# Patient Record
Sex: Male | Born: 1985
Health system: Southern US, Community
[De-identification: ages and names within clinical notes are randomized; demographics above are authoritative.]

## PROBLEM LIST (undated history)

## (undated) DIAGNOSIS — E785 Hyperlipidemia, unspecified: Secondary | ICD-10-CM

## (undated) DIAGNOSIS — I5042 Chronic combined systolic (congestive) and diastolic (congestive) heart failure: Secondary | ICD-10-CM

## (undated) DIAGNOSIS — N183 Chronic kidney disease, stage 3 unspecified: Secondary | ICD-10-CM

## (undated) DIAGNOSIS — I10 Essential (primary) hypertension: Secondary | ICD-10-CM

## (undated) DIAGNOSIS — Z9189 Other specified personal risk factors, not elsewhere classified: Secondary | ICD-10-CM

## (undated) DIAGNOSIS — R0600 Dyspnea, unspecified: Secondary | ICD-10-CM

## (undated) DIAGNOSIS — N475 Adhesions of prepuce and glans penis: Secondary | ICD-10-CM

## (undated) DIAGNOSIS — R0609 Other forms of dyspnea: Secondary | ICD-10-CM

## (undated) DIAGNOSIS — I428 Other cardiomyopathies: Secondary | ICD-10-CM

## (undated) HISTORY — PX: NO PAST SURGERIES: SHX2092

## (undated) HISTORY — PX: TRANSTHORACIC ECHOCARDIOGRAM: SHX275

---
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
## 2013-11-08 ENCOUNTER — Encounter (HOSPITAL_COMMUNITY): Payer: Self-pay | Admitting: Emergency Medicine

## 2013-11-08 ENCOUNTER — Emergency Department (INDEPENDENT_AMBULATORY_CARE_PROVIDER_SITE_OTHER)
Admission: EM | Admit: 2013-11-08 | Discharge: 2013-11-08 | Disposition: A | Discharge status: Home / Self Care | Payer: Self-pay | Source: Home / Self Care | Attending: Family Medicine | Admitting: Family Medicine

## 2013-11-08 DIAGNOSIS — I10 Essential (primary) hypertension: Secondary | ICD-10-CM

## 2013-11-08 HISTORY — DX: Essential (primary) hypertension: I10

## 2013-11-08 LAB — POCT I-STAT, CHEM 8
BUN: 20 mg/dL (ref 6–23)
Calcium, Ion: 1.24 mmol/L — ABNORMAL HIGH (ref 1.12–1.23)
Chloride: 101 mEq/L (ref 96–112)
Creatinine, Ser: 1.6 mg/dL — ABNORMAL HIGH (ref 0.50–1.35)
Glucose, Bld: 90 mg/dL (ref 70–99)
HCT: 49 % (ref 39.0–52.0)
Hemoglobin: 16.7 g/dL (ref 13.0–17.0)
Potassium: 3.9 mEq/L (ref 3.7–5.3)
Sodium: 141 mEq/L (ref 137–147)
TCO2: 24 mmol/L (ref 0–100)

## 2013-11-08 MED ORDER — LISINOPRIL 20 MG PO TABS: 20.0000 mg | ORAL_TABLET | Freq: Every day | ORAL | Status: DC

## 2013-11-08 MED ORDER — AMLODIPINE BESYLATE 5 MG PO TABS
5.0000 mg | ORAL_TABLET | Freq: Every day | ORAL | Status: DC
Start: 2013-11-08 — End: 2014-09-21

## 2013-11-08 NOTE — ED Notes (Addendum)
Pt c/o HTN; BP today is 180/145 C/o constant HA Denies CP, SOB, weakness, diaphoresis Has not had BP meds x4 months +++ Smokes 0.5 PPD Alert and talking in complete sentences w/no signs of acute distress.

## 2013-11-08 NOTE — ED Provider Notes (Signed)
CSN: [id number]     Arrival date & time 11/08/13  1346 History   First MD Initiated Contact with Patient 11/08/13 1438     Chief Complaint  Patient presents with  . Hypertension   (Consider location/radiation/quality/duration/timing/severity/associated sxs/prior Treatment) Patient is a 28 y.o. male presenting with hypertension. The history is provided by the patient.  Hypertension This is a chronic problem. Episode onset: out of meds for 4 mo, recently checked bp and elevated, knows he needs meds. The problem has not changed since onset.Pertinent negatives include no chest pain, no headaches and no shortness of breath.    Past Medical History  Diagnosis Date  . Hypertension    History reviewed. No pertinent past surgical history. No family history on file. History  Substance Use Topics  . Smoking status: Current Every Day Smoker -- 0.50 packs/day    Types: Cigarettes  . Smokeless tobacco: Not on file  . Alcohol Use: No    Review of Systems  Constitutional: Negative.   HENT: Negative.   Respiratory: Negative for chest tightness and shortness of breath.   Cardiovascular: Negative for chest pain.  Neurological: Negative for headaches.    Allergies  Review of patient's allergies indicates no known allergies.  Home Medications   Prior to Admission medications   Medication Sig Start Date End Date Taking? Authorizing Provider  amLODipine (NORVASC) 5 MG tablet Take 1 tablet (5 mg total) by mouth daily. 11/08/13   Linna Hoff, MD  aspirin 81 MG tablet Take 81 mg by mouth daily.    Historical Provider, MD  hydrochlorothiazide (HYDRODIURIL) 25 MG tablet Take 25 mg by mouth daily.    Historical Provider, MD  lisinopril (PRINIVIL,ZESTRIL) 20 MG tablet Take 1 tablet (20 mg total) by mouth daily. 11/08/13   Linna Hoff, MD  lisinopril (PRINIVIL,ZESTRIL) 40 MG tablet Take 40 mg by mouth daily.    Historical Provider, MD  simvastatin (ZOCOR) 20 MG tablet Take 20 mg by mouth daily.     Historical Provider, MD   BP 180/145  Pulse 84  Temp(Src) 98.5 F (36.9 C) (Oral)  Resp 16  SpO2 99% Physical Exam  Nursing note and vitals reviewed. Constitutional: He is oriented to person, place, and time. He appears well-developed and well-nourished. No distress.  Neck: Normal range of motion. Neck supple. No thyromegaly present.  Cardiovascular: Normal heart sounds.   Pulmonary/Chest: Effort normal and breath sounds normal.  Musculoskeletal: He exhibits no edema.  Lymphadenopathy:    He has no cervical adenopathy.  Neurological: He is alert and oriented to person, place, and time.  Skin: Skin is warm and dry.    ED Course  Procedures (including critical care time) Labs Review Labs Reviewed  POCT I-STAT, CHEM 8 - Abnormal; Notable for the following:    Creatinine, Ser 1.60 (*)    Calcium, Ion 1.24 (*)    All other components within normal limits    Imaging Review No results found.   MDM   1. Essential hypertension        Linna Hoff, MD 11/08/13 [phone]

## 2013-11-08 NOTE — Discharge Instructions (Signed)
See doctor in 2 weeks for bp recheck and medicine adjustment as needed.

## 2013-11-10 NOTE — ED Notes (Signed)
Pt drivers license at visit Called [phone] but # has been disconnected Left drivers license at the front/folder

## 2014-05-01 ENCOUNTER — Encounter (HOSPITAL_COMMUNITY): Payer: Self-pay | Admitting: Emergency Medicine

## 2014-05-01 ENCOUNTER — Emergency Department (INDEPENDENT_AMBULATORY_CARE_PROVIDER_SITE_OTHER)
Admission: EM | Admit: 2014-05-01 | Discharge: 2014-05-01 | Disposition: A | Discharge status: Home / Self Care | Payer: Self-pay | Source: Home / Self Care | Attending: Emergency Medicine | Admitting: Emergency Medicine

## 2014-05-01 DIAGNOSIS — E785 Hyperlipidemia, unspecified: Secondary | ICD-10-CM

## 2014-05-01 DIAGNOSIS — I10 Essential (primary) hypertension: Secondary | ICD-10-CM

## 2014-05-01 DIAGNOSIS — Z720 Tobacco use: Secondary | ICD-10-CM

## 2014-05-01 LAB — POCT I-STAT, CHEM 8
BUN: 21 mg/dL (ref 6–23)
CHLORIDE: 102 meq/L (ref 96–112)
Calcium, Ion: 1.15 mmol/L (ref 1.12–1.23)
Creatinine, Ser: 1.5 mg/dL — ABNORMAL HIGH (ref 0.50–1.35)
Glucose, Bld: 103 mg/dL — ABNORMAL HIGH (ref 70–99)
HCT: 48 % (ref 39.0–52.0)
Hemoglobin: 16.3 g/dL (ref 13.0–17.0)
Potassium: 3.8 mEq/L (ref 3.7–5.3)
Sodium: 139 mEq/L (ref 137–147)
TCO2: 24 mmol/L (ref 0–100)

## 2014-05-01 MED ORDER — LOSARTAN POTASSIUM-HCTZ 50-12.5 MG PO TABS: 1.0000 | ORAL_TABLET | Freq: Every day | ORAL | Status: DC

## 2014-05-01 NOTE — Discharge Instructions (Signed)
Hypertension Hypertension is another name for high blood pressure. High blood pressure forces your heart to work harder to pump blood. A blood pressure reading has two numbers, which includes a higher number over a lower number (example: 110/72). HOME CARE   Have your blood pressure rechecked by your doctor.  Only take medicine as told by your doctor. Follow the directions carefully. The medicine does not work as well if you skip doses. Skipping doses also puts you at risk for problems.  Do not smoke.  Monitor your blood pressure at home as told by your doctor. GET HELP IF:  You think you are having a reaction to the medicine you are taking.  You have repeat headaches or feel dizzy.  You have puffiness (swelling) in your ankles.  You have trouble with your vision. GET HELP RIGHT AWAY IF:   You get a very bad headache and are confused.  You feel weak, numb, or faint.  You get chest or belly (abdominal) pain.  You throw up (vomit).  You cannot breathe very well. MAKE SURE YOU:   Understand these instructions.  Will watch your condition.  Will get help right away if you are not doing well or get worse. Document Released: 10/22/2007 Document Revised: 05/10/2013 Document Reviewed: 02/25/2013 [facility] Patient Information 2015 [place], [place]. This information is not intended to replace advice given to you by your health care provider. Make sure you discuss any questions you have with your health care provider.  Smoking Cessation Quitting smoking is important to your health and has many advantages. However, it is not always easy to quit since nicotine is a very addictive drug. Oftentimes, people try 3 times or more before being able to quit. This document explains the best ways for you to prepare to quit smoking. Quitting takes hard work and a lot of effort, but you can do it. ADVANTAGES OF QUITTING SMOKING  You will live longer, feel better, and live better.  Your body will  feel the impact of quitting smoking almost immediately.  Within 20 minutes, blood pressure decreases. Your pulse returns to its normal level.  After 8 hours, carbon monoxide levels in the blood return to normal. Your oxygen level increases.  After 24 hours, the chance of having a heart attack starts to decrease. Your breath, hair, and body stop smelling like smoke.  After 48 hours, damaged nerve endings begin to recover. Your sense of taste and smell improve.  After 72 hours, the body is virtually free of nicotine. Your bronchial tubes relax and breathing becomes easier.  After 2 to 12 weeks, lungs can hold more air. Exercise becomes easier and circulation improves.  The risk of having a heart attack, stroke, cancer, or lung disease is greatly reduced.  After 1 year, the risk of coronary heart disease is cut in half.  After 5 years, the risk of stroke falls to the same as a nonsmoker.  After 10 years, the risk of lung cancer is cut in half and the risk of other cancers decreases significantly.  After 15 years, the risk of coronary heart disease drops, usually to the level of a nonsmoker.  If you are pregnant, quitting smoking will improve your chances of having a healthy baby.  The people you live with, especially any children, will be healthier.  You will have extra money to spend on things other than cigarettes. QUESTIONS TO THINK ABOUT BEFORE ATTEMPTING TO QUIT You may want to talk about your answers with your health  care provider.  Why do you want to quit?  If you tried to quit in the past, what helped and what did not?  What will be the most difficult situations for you after you quit? How will you plan to handle them?  Who can help you through the tough times? Your family? Friends? A health care provider?  What pleasures do you get from smoking? What ways can you still get pleasure if you quit? Here are some questions to ask your health care provider:  How can you  help me to be successful at quitting?  What medicine do you think would be best for me and how should I take it?  What should I do if I need more help?  What is smoking withdrawal like? How can I get information on withdrawal? GET READY  Set a quit date.  Change your environment by getting rid of all cigarettes, ashtrays, matches, and lighters in your home, car, or work. Do not let people smoke in your home.  Review your past attempts to quit. Think about what worked and what did not. GET SUPPORT AND ENCOURAGEMENT You have a better chance of being successful if you have help. You can get support in many ways.  Tell your family, friends, and coworkers that you are going to quit and need their support. Ask them not to smoke around you.  Get individual, group, or telephone counseling and support. Programs are available at [employer] and health centers. Call your local health department for information about programs in your area.  Spiritual beliefs and practices may help some smokers quit.  Download a "quit meter" on your computer to keep track of quit statistics, such as how long you have gone without smoking, cigarettes not smoked, and money saved.  Get a self-help book about quitting smoking and staying off tobacco. LEARN NEW SKILLS AND BEHAVIORS  Distract yourself from urges to smoke. Talk to someone, go for a walk, or occupy your time with a task.  Change your normal routine. Take a different route to work. Drink tea instead of coffee. Eat breakfast in a different place.  Reduce your stress. Take a hot bath, exercise, or read a book.  Plan something enjoyable to do every day. Reward yourself for not smoking.  Explore interactive web-based programs that specialize in helping you quit. GET MEDICINE AND USE IT CORRECTLY Medicines can help you stop smoking and decrease the urge to smoke. Combining medicine with the above behavioral methods and support can greatly increase  your chances of successfully quitting smoking.  Nicotine replacement therapy helps deliver nicotine to your body without the negative effects and risks of smoking. Nicotine replacement therapy includes nicotine gum, lozenges, inhalers, nasal sprays, and skin patches. Some may be available over-the-counter and others require a prescription.  Antidepressant medicine helps people abstain from smoking, but how this works is unknown. This medicine is available by prescription.  Nicotinic receptor partial agonist medicine simulates the effect of nicotine in your brain. This medicine is available by prescription. Ask your health care provider for advice about which medicines to use and how to use them based on your health history. Your health care provider will tell you what side effects to look out for if you choose to be on a medicine or therapy. Carefully read the information on the package. Do not use any other product containing nicotine while using a nicotine replacement product.  RELAPSE OR DIFFICULT SITUATIONS Most relapses occur within the first 3  months after quitting. Do not be discouraged if you start smoking again. Remember, most people try several times before finally quitting. You may have symptoms of withdrawal because your body is used to nicotine. You may crave cigarettes, be irritable, feel very hungry, cough often, get headaches, or have difficulty concentrating. The withdrawal symptoms are only temporary. They are strongest when you first quit, but they will go away within 10-14 days. To reduce the chances of relapse, try to:  Avoid drinking alcohol. Drinking lowers your chances of successfully quitting.  Reduce the amount of caffeine you consume. Once you quit smoking, the amount of caffeine in your body increases and can give you symptoms, such as a rapid heartbeat, sweating, and anxiety.  Avoid smokers because they can make you want to smoke.  Do not let weight gain distract you.  Many smokers will gain weight when they quit, usually less than 10 pounds. Eat a healthy diet and stay active. You can always lose the weight gained after you quit.  Find ways to improve your mood other than smoking. FOR MORE INFORMATION  www.smokefree.gov  Document Released: 04/29/2001 Document Revised: 09/19/2013 Document Reviewed: 08/14/2011 [facility] Patient Information 2015 [place], [place]. This information is not intended to replace advice given to you by your health care provider. Make sure you discuss any questions you have with your health care provider.  Fat and Cholesterol Control Diet Your diet has an affect on your fat and cholesterol levels in your blood and organs. Too much fat and cholesterol in your blood can affect your:  Heart.  Blood vessels (arteries, veins).  Gallbladder.  Liver.  Pancreas. CONTROL FAT AND CHOLESTEROL WITH DIET Certain foods raise cholesterol and others lower it. It is important to replace bad fats with other types of fat.  Do not eat:  Fatty meats, such as hot dogs and salami.  Stick margarine and some tub margarines that have "partially hydrogenated oils" in them.  Baked goods, such as cookies and crackers that have "partially hydrogenated oils" in them.  Saturated tropical oils, such as coconut and palm oil. Eat the following foods:  Round or loin cuts of red meat.  Chicken (without skin).  Fish.  Veal.  Ground [place]urkey breast.  Shellfish.  Fruit, such as apples.  Vegetables, such as broccoli, potatoes, and carrots.  Beans, peas, and lentils (legumes).  Grains, such as barley, rice, couscous, and bulgar wheat.  Pasta (without cream sauces). Look for foods that are nonfat, low in fat, and low in cholesterol.  FIND FOODS THAT ARE LOWER IN FAT AND CHOLESTEROL  Find foods with soluble fiber and plant sterols (phytosterol). You should eat 2 grams a day of these foods. These foods include:  Fruits.  Vegetables.  Whole  grains.  Dried beans and peas.  Nuts and seeds.  Read package labels. Look for low-saturated fats, trans fat free, low-fat foods.  Choose cheese that have only 2 to 3 grams of saturated fat per ounce.  Use heart-healthy tub margarine that is free of trans fat or partially hydrogenated oil.  Avoid buying baked goods that have partially hydrogenated oils in them. Instead, buy baked goods made with whole grains (whole-wheat or whole oat flour). Avoid baked goods labeled with "flour" or "enriched flour."  Buy non-creamy canned soups with reduced salt and no added fats. PREPARING YOUR FOOD  Broil, bake, steam, or roast foods. Do not fry food.  Use non-stick cooking sprays.  Use lemon or herbs to flavor food instead of using  butter or stick margarine.  Use nonfat yogurt, salsa, or low-fat dressings for salads. LOW-SATURATED FAT / LOW-FAT FOOD SUBSTITUTES  Meats / Saturated Fat (g)  Avoid: Steak, marbled (3 oz/85 g) / 11 g.  Choose: Steak, lean (3 oz/85 g) / 4 g.  Avoid: Hamburger (3 oz/85 g) / 7 g.  Choose: Hamburger, lean (3 oz/85 g) / 5 g.  Avoid: Ham (3 oz/85 g) / 6 g.  Choose: Ham, lean cut (3 oz/85 g) / 2.4 g.  Avoid: Chicken, with skin, dark meat (3 oz/85 g) / 4 g.  Choose: Chicken, skin removed, dark meat (3 oz/85 g) / 2 g.  Avoid: Chicken, with skin, light meat (3 oz/85 g) / 2.5 g.  Choose: Chicken, skin removed, light meat (3 oz/85 g) / 1 g. Dairy / Saturated Fat (g)  Avoid: Whole milk (1 cup) / 5 g.  Choose: Low-fat milk, 2% (1 cup) / 3 g.  Choose: Low-fat milk, 1% (1 cup) / 1.5 g.  Choose: Skim milk (1 cup) / 0.3 g.  Avoid: Hard cheese (1 oz/28 g) / 6 g.  Choose: Skim milk cheese (1 oz/28 g) / 2 to 3 g.  Avoid: Cottage cheese, 4% fat (1 cup) / 6.5 g.  Choose: Low-fat cottage cheese, 1% fat (1 cup) / 1.5 g.  Avoid: Ice cream (1 cup) / 9 g.  Choose: Sherbet (1 cup) / 2.5 g.  Choose: Nonfat frozen yogurt (1 cup) / 0.3 g.  Choose: Frozen fruit  bar / trace.  Avoid: Whipped cream (1 tbs) / 3.5 g.  Choose: Nondairy whipped topping (1 tbs) / 1 g. Condiments / Saturated Fat (g)  Avoid: Mayonnaise (1 tbs) / 2 g.  Choose: Low-fat mayonnaise (1 tbs) / 1 g.  Avoid: Butter (1 tbs) / 7 g.  Choose: Extra light margarine (1 tbs) / 1 g.  Avoid: Coconut oil (1 tbs) / 11.8 g.  Choose: Olive oil (1 tbs) / 1.8 g.  Choose: Corn oil (1 tbs) / 1.7 g.  Choose: Safflower oil (1 tbs) / 1.2 g.  Choose: Sunflower oil (1 tbs) / 1.4 g.  Choose: Soybean oil (1 tbs) / 2.4 g .  Choose: Canola oil (1 tbs) / 1 g. Document Released: 11/04/2011 Document Revised: 01/05/2013 Document Reviewed: 08/04/2013 [facility] Patient Information 2015 [place], [place]. This information is not intended to replace advice given to you by your health care provider. Make sure you discuss any questions you have with your health care provider.

## 2014-05-01 NOTE — ED Notes (Signed)
Pt states that he has not had his BP medication in 6 to 7 months.

## 2014-05-01 NOTE — ED Provider Notes (Signed)
CSN: [id number]     Arrival date & time 05/01/14  [id number] History   First MD Initiated Contact with Patient 05/01/14 1002     Chief Complaint  Patient presents with  . Hypertension  . Medication Refill   (Consider location/radiation/quality/duration/timing/severity/associated sxs/prior Treatment) HPI Comments: 28 year old male presents to have his blood pressure medicine refilled. He has been in jail for 4 years and was discharged approximately one year ago. He was given nearly a years worth of blood pressure medicine to take until he can find a doctor but states he got lazy and never really found 1. He is now complaining of headache, dull achiness, left side of the head. Occasionally will become lightheaded. He also smokes one half pack per day. Denies chest pain, shortness of breath, syncope. He states he is radiated to get back in shape and in better health so he can support his new unborn child.   Past Medical History  Diagnosis Date  . Hypertension    History reviewed. No pertinent past surgical history. History reviewed. No pertinent family history. History  Substance Use Topics  . Smoking status: Current Every Day Smoker -- 0.50 packs/day    Types: Cigarettes  . Smokeless tobacco: Not on file  . Alcohol Use: No    Review of Systems  Constitutional: Negative.   Respiratory: Negative for cough and shortness of breath.   Cardiovascular: Negative for chest pain, palpitations and leg swelling.  Gastrointestinal: Negative for vomiting and abdominal pain.  Genitourinary: Negative.   Musculoskeletal: Negative.   Neurological: Positive for light-headedness and headaches. Negative for tremors, seizures, syncope, speech difficulty and numbness.    Allergies  Review of patient's allergies indicates no known allergies.  Home Medications   Prior to Admission medications   Medication Sig Start Date End Date Taking? Authorizing Provider  amLODipine (NORVASC) 5 MG tablet Take 1 tablet  (5 mg total) by mouth daily. 11/08/13   Linna HoffJames D Kindl, MD  aspirin 81 MG tablet Take 81 mg by mouth daily.    Historical Provider, MD  hydrochlorothiazide (HYDRODIURIL) 25 MG tablet Take 25 mg by mouth daily.    Historical Provider, MD  losartan-hydrochlorothiazide (HYZAAR) 50-12.5 MG per tablet Take 1 tablet by mouth daily. 05/01/14   Hayden Rasmussenavid Rishaan Gunner, NP  simvastatin (ZOCOR) 20 MG tablet Take 20 mg by mouth daily.    Historical Provider, MD   BP 173/95 mmHg  Pulse 74  Temp(Src) 97.9 F (36.6 C) (Oral)  Resp 18  SpO2 100% Physical Exam  Constitutional: He is oriented to person, place, and time. He appears well-developed and well-nourished. No distress.  Eyes: Conjunctivae and EOM are normal. Pupils are equal, round, and reactive to light.  Neck: Normal range of motion. Neck supple.  Cardiovascular: Normal rate, regular rhythm and normal heart sounds.   No murmur heard. Pulmonary/Chest: Effort normal. No respiratory distress. He has no wheezes.  Musculoskeletal: Normal range of motion. He exhibits no edema.  Neurological: He is alert and oriented to person, place, and time. No cranial nerve deficit. He exhibits normal muscle tone.  Skin: Skin is warm and dry.  Psychiatric: He has a normal mood and affect.  Nursing note and vitals reviewed.   ED Course  Procedures (including critical care time) Labs Review Labs Reviewed  POCT I-STAT, CHEM 8 - Abnormal; Notable for the following:    Creatinine, Ser 1.50 (*)    Glucose, Bld 103 (*)    All other components within normal limits    Imaging  Review No results found.   MDM   1. Essential hypertension   2. Tobacco abuse disorder   3. Dyslipidemia    Losartan/hctz 50/12.5 q d #50 Must see PCP for Primary Care.    Hayden Rasmussen, NP 05/01/14 1126

## 2014-09-18 ENCOUNTER — Emergency Department (HOSPITAL_COMMUNITY): Payer: Medicaid Other

## 2014-09-18 ENCOUNTER — Inpatient Hospital Stay (HOSPITAL_COMMUNITY)
Admission: EM | Admit: 2014-09-18 | Discharge: 2014-09-21 | DRG: 291 | Disposition: A | Discharge status: Home / Self Care | Payer: Medicaid Other | Attending: Internal Medicine | Admitting: Internal Medicine

## 2014-09-18 ENCOUNTER — Encounter (HOSPITAL_COMMUNITY): Payer: Self-pay | Admitting: *Deleted

## 2014-09-18 DIAGNOSIS — R0602 Shortness of breath: Secondary | ICD-10-CM

## 2014-09-18 DIAGNOSIS — I5041 Acute combined systolic (congestive) and diastolic (congestive) heart failure: Secondary | ICD-10-CM | POA: Diagnosis present

## 2014-09-18 DIAGNOSIS — I10 Essential (primary) hypertension: Secondary | ICD-10-CM | POA: Diagnosis present

## 2014-09-18 DIAGNOSIS — E785 Hyperlipidemia, unspecified: Secondary | ICD-10-CM | POA: Diagnosis present

## 2014-09-18 DIAGNOSIS — Z7982 Long term (current) use of aspirin: Secondary | ICD-10-CM | POA: Diagnosis not present

## 2014-09-18 DIAGNOSIS — Z9119 Patient's noncompliance with other medical treatment and regimen: Secondary | ICD-10-CM

## 2014-09-18 DIAGNOSIS — I313 Pericardial effusion (noninflammatory): Secondary | ICD-10-CM | POA: Diagnosis present

## 2014-09-18 DIAGNOSIS — Z91199 Patient's noncompliance with other medical treatment and regimen due to unspecified reason: Secondary | ICD-10-CM

## 2014-09-18 DIAGNOSIS — E669 Obesity, unspecified: Secondary | ICD-10-CM | POA: Diagnosis present

## 2014-09-18 DIAGNOSIS — Z87891 Personal history of nicotine dependence: Secondary | ICD-10-CM | POA: Diagnosis not present

## 2014-09-18 DIAGNOSIS — I248 Other forms of acute ischemic heart disease: Secondary | ICD-10-CM | POA: Diagnosis present

## 2014-09-18 DIAGNOSIS — N182 Chronic kidney disease, stage 2 (mild): Secondary | ICD-10-CM | POA: Diagnosis present

## 2014-09-18 DIAGNOSIS — Z6839 Body mass index (BMI) 39.0-39.9, adult: Secondary | ICD-10-CM

## 2014-09-18 DIAGNOSIS — N179 Acute kidney failure, unspecified: Secondary | ICD-10-CM | POA: Diagnosis present

## 2014-09-18 DIAGNOSIS — I429 Cardiomyopathy, unspecified: Secondary | ICD-10-CM | POA: Diagnosis present

## 2014-09-18 DIAGNOSIS — E119 Type 2 diabetes mellitus without complications: Secondary | ICD-10-CM | POA: Diagnosis present

## 2014-09-18 DIAGNOSIS — R778 Other specified abnormalities of plasma proteins: Secondary | ICD-10-CM | POA: Diagnosis present

## 2014-09-18 DIAGNOSIS — R7989 Other specified abnormal findings of blood chemistry: Secondary | ICD-10-CM | POA: Diagnosis present

## 2014-09-18 DIAGNOSIS — E876 Hypokalemia: Secondary | ICD-10-CM | POA: Diagnosis present

## 2014-09-18 DIAGNOSIS — I161 Hypertensive emergency: Secondary | ICD-10-CM | POA: Insufficient documentation

## 2014-09-18 DIAGNOSIS — Z9114 Patient's other noncompliance with medication regimen: Secondary | ICD-10-CM | POA: Diagnosis present

## 2014-09-18 DIAGNOSIS — F1290 Cannabis use, unspecified, uncomplicated: Secondary | ICD-10-CM | POA: Diagnosis present

## 2014-09-18 DIAGNOSIS — N183 Chronic kidney disease, stage 3 unspecified: Secondary | ICD-10-CM

## 2014-09-18 DIAGNOSIS — I130 Hypertensive heart and chronic kidney disease with heart failure and stage 1 through stage 4 chronic kidney disease, or unspecified chronic kidney disease: Secondary | ICD-10-CM

## 2014-09-18 DIAGNOSIS — R0600 Dyspnea, unspecified: Secondary | ICD-10-CM

## 2014-09-18 DIAGNOSIS — N189 Chronic kidney disease, unspecified: Secondary | ICD-10-CM | POA: Insufficient documentation

## 2014-09-18 DIAGNOSIS — I5022 Chronic systolic (congestive) heart failure: Secondary | ICD-10-CM | POA: Diagnosis present

## 2014-09-18 DIAGNOSIS — I5021 Acute systolic (congestive) heart failure: Secondary | ICD-10-CM

## 2014-09-18 LAB — BASIC METABOLIC PANEL
Anion gap: 12 (ref 5–15)
BUN: 18 mg/dL (ref 6–20)
CO2: 21 mmol/L — ABNORMAL LOW (ref 22–32)
Calcium: 8.8 mg/dL — ABNORMAL LOW (ref 8.9–10.3)
Chloride: 104 mmol/L (ref 101–111)
Creatinine, Ser: 1.75 mg/dL — ABNORMAL HIGH (ref 0.61–1.24)
GFR calc Af Amer: 59 mL/min — ABNORMAL LOW (ref >60)
GFR calc non Af Amer: 51 mL/min — ABNORMAL LOW (ref >60)
Glucose, Bld: 108 mg/dL — ABNORMAL HIGH (ref 70–99)
Potassium: 3 mmol/L — ABNORMAL LOW (ref 3.5–5.1)
Sodium: 137 mmol/L (ref 135–145)

## 2014-09-18 LAB — CBC WITH DIFFERENTIAL/PLATELET
Basophils Absolute: 0 10*3/uL (ref 0.0–0.1)
Basophils Relative: 0 % (ref 0–1)
Eosinophils Absolute: 0.1 10*3/uL (ref 0.0–0.7)
Eosinophils Relative: 1 % (ref 0–5)
HCT: 39.8 % (ref 39.0–52.0)
Hemoglobin: 14 g/dL (ref 13.0–17.0)
Lymphocytes Relative: 23 % (ref 12–46)
Lymphs Abs: 2.1 10*3/uL (ref 0.7–4.0)
MCH: 28.8 pg (ref 26.0–34.0)
MCHC: 35.2 g/dL (ref 30.0–36.0)
MCV: 81.9 fL (ref 78.0–100.0)
Monocytes Absolute: 0.9 10*3/uL (ref 0.1–1.0)
Monocytes Relative: 10 % (ref 3–12)
Neutro Abs: 6 10*3/uL (ref 1.7–7.7)
Neutrophils Relative %: 66 % (ref 43–77)
Platelets: 316 10*3/uL (ref 150–400)
RBC: 4.86 MIL/uL (ref 4.22–5.81)
RDW: 14.2 % (ref 11.5–15.5)
WBC: 9 10*3/uL (ref 4.0–10.5)

## 2014-09-18 LAB — D-DIMER, QUANTITATIVE (NOT AT ARMC): D-Dimer, Quant: 1.71 ug/mL-FEU — ABNORMAL HIGH (ref 0.00–0.48)

## 2014-09-18 LAB — GLUCOSE, CAPILLARY: Glucose-Capillary: 122 mg/dL — ABNORMAL HIGH (ref 70–99)

## 2014-09-18 LAB — TROPONIN I
TROPONIN I: 0.56 ng/mL — AB (ref <0.031)
Troponin I: 0.54 ng/mL (ref <0.031)

## 2014-09-18 LAB — TSH: TSH: 2.057 u[IU]/mL (ref 0.350–4.500)

## 2014-09-18 LAB — BRAIN NATRIURETIC PEPTIDE: B Natriuretic Peptide: 391.7 pg/mL — ABNORMAL HIGH (ref 0.0–100.0)

## 2014-09-18 MED ORDER — LISINOPRIL 5 MG PO TABS
5.0000 mg | ORAL_TABLET | Freq: Every day | ORAL | Status: DC
  Administered 2014-09-18: 5 mg via ORAL
  Filled 2014-09-18 (×2): qty 1

## 2014-09-18 MED ORDER — IOHEXOL 350 MG/ML SOLN
100.0000 mL | Freq: Once | INTRAVENOUS | Status: AC | PRN
  Administered 2014-09-18: 100 mL via INTRAVENOUS

## 2014-09-18 MED ORDER — CARVEDILOL 25 MG PO TABS
25.0000 mg | ORAL_TABLET | Freq: Two times a day (BID) | ORAL | Status: DC
  Administered 2014-09-18 – 2014-09-21 (×6): 25 mg via ORAL
  Filled 2014-09-18 (×8): qty 1

## 2014-09-18 MED ORDER — ACETAMINOPHEN 325 MG PO TABS: 650.0000 mg | ORAL_TABLET | ORAL | Status: DC | PRN

## 2014-09-18 MED ORDER — HYDRALAZINE HCL 20 MG/ML IJ SOLN
10.0000 mg | Freq: Four times a day (QID) | INTRAMUSCULAR | Status: DC | PRN
  Administered 2014-09-18: 10 mg via INTRAVENOUS
  Filled 2014-09-18: qty 1

## 2014-09-18 MED ORDER — AMLODIPINE BESYLATE 5 MG PO TABS
5.0000 mg | ORAL_TABLET | Freq: Every day | ORAL | Status: DC
Start: 2014-09-18 — End: 2014-09-21
  Administered 2014-09-18 – 2014-09-21 (×4): 5 mg via ORAL
  Filled 2014-09-18 (×4): qty 1

## 2014-09-18 MED ORDER — HYDRALAZINE HCL 20 MG/ML IJ SOLN: 5.0000 mg | Freq: Four times a day (QID) | INTRAMUSCULAR | Status: DC | PRN

## 2014-09-18 MED ORDER — ENOXAPARIN SODIUM 40 MG/0.4ML ~~LOC~~ SOLN
40.0000 mg | SUBCUTANEOUS | Status: DC
  Administered 2014-09-18: 40 mg via SUBCUTANEOUS
  Filled 2014-09-18 (×2): qty 0.4

## 2014-09-18 MED ORDER — SODIUM CHLORIDE 0.9 % IV SOLN: 250.0000 mL | INTRAVENOUS | Status: DC | PRN

## 2014-09-18 MED ORDER — SODIUM CHLORIDE 0.9 % IJ SOLN
3.0000 mL | Freq: Two times a day (BID) | INTRAMUSCULAR | Status: DC
  Administered 2014-09-18 – 2014-09-21 (×6): 3 mL via INTRAVENOUS

## 2014-09-18 MED ORDER — SODIUM CHLORIDE 0.9 % IJ SOLN: 3.0000 mL | INTRAMUSCULAR | Status: DC | PRN

## 2014-09-18 MED ORDER — CARVEDILOL 3.125 MG PO TABS
3.1250 mg | ORAL_TABLET | Freq: Two times a day (BID) | ORAL | Status: DC
  Filled 2014-09-18 (×2): qty 1

## 2014-09-18 MED ORDER — SODIUM CHLORIDE 0.9 % IV BOLUS (SEPSIS)
1000.0000 mL | Freq: Once | INTRAVENOUS | Status: AC
  Administered 2014-09-18: 1000 mL via INTRAVENOUS

## 2014-09-18 MED ORDER — ASPIRIN 81 MG PO CHEW
81.0000 mg | CHEWABLE_TABLET | Freq: Every day | ORAL | Status: DC
  Administered 2014-09-18 – 2014-09-21 (×4): 81 mg via ORAL
  Filled 2014-09-18 (×4): qty 1

## 2014-09-18 MED ORDER — ISOSORB DINITRATE-HYDRALAZINE 20-37.5 MG PO TABS
1.0000 | ORAL_TABLET | Freq: Three times a day (TID) | ORAL | Status: DC
Start: 2014-09-18 — End: 2014-09-20
  Administered 2014-09-18 – 2014-09-20 (×5): 1 via ORAL
  Filled 2014-09-18 (×7): qty 1

## 2014-09-18 MED ORDER — SIMVASTATIN 20 MG PO TABS
20.0000 mg | ORAL_TABLET | Freq: Every day | ORAL | Status: DC
  Administered 2014-09-18 – 2014-09-21 (×4): 20 mg via ORAL
  Filled 2014-09-18 (×4): qty 1

## 2014-09-18 MED ORDER — FUROSEMIDE 10 MG/ML IJ SOLN
20.0000 mg | Freq: Two times a day (BID) | INTRAMUSCULAR | Status: DC
  Administered 2014-09-18 – 2014-09-19 (×2): 20 mg via INTRAVENOUS
  Filled 2014-09-18 (×4): qty 2

## 2014-09-18 MED ORDER — ONDANSETRON HCL 4 MG/2ML IJ SOLN: 4.0000 mg | Freq: Four times a day (QID) | INTRAMUSCULAR | Status: DC | PRN

## 2014-09-18 NOTE — Progress Notes (Signed)
Pt c/o slight SOB, dizziness and weakness after going to the bathroom. VS taken and recorded . Verbalized feeling better.

## 2014-09-18 NOTE — Consult Note (Signed)
Date: 09/18/2014               Patient Name:  Shawn [name] MRN: [id number]  DOB: [birth year] Age / Sex: 29 y.o., male   PCP: No Pcp Per Patient         Requesting Physician: Dr. Hollice Espy, MD    Consulting Reason:  Chest pain and SOB.     Chief Complaint: SOB and Cough.   History of Present Illness: 26 Y O male with PMH of HTN, Obesity, CKD- stage 2, HLD, presented to the ED with 2 week hx of SOB and Cough. SOB was initially intermittent in the first week, present with exertion and also at night when he wakes up suddenly SOB and gasping for air, and then he would start coughing, but says these episodes would resolve and he would go back to sleep, he is able to sleep flat, uses 1-2 pillows and this has not changed . Patient endorses abdominal distension but denies, denies leg swelling. Patient says his stable weight over the past year has ranged between 275- 280 Lbs, last checked his weight about a month ago, and denies weight gain. Cough also started about the same time, productive of whitish sputum. Patient endorses bilateral chest pain son his sides, present on,ly when he coughs, but otherwise denies chest pain.  Patient denies fever, sick contacts, rash, leg swelling, facial swelling, vision changes, no previous episodes of similar symptoms. Patient endorses family hx of enlarged heart in his maternal uncle, who was diagnosed at the age  of 39, and died suddenly ~6 months later, after an episode of chest pain, otherwise no family hx of sudden death or heart disease in first degree relatives. Patient was diagnosed with HTN at 30 years of age, has not been complaint with his blood pressure meds due to financial resons- Says his current meds Lorsatan- HCTZ only. He get his medications at the urgent care, has no PCP.  Pt quit smoking cigs- 4 months ago, but presently smokes marijuana, but denies cocaine use or other drugs. Started smoking cigs about the age of 13 years, on and off over the  years, smoked half a PPD. Patient says he was incarcerated for 40 months, was released 21 months ago.  Meds: Current Facility-Administered Medications  Medication Dose Route Frequency Provider Last Rate Last Dose  . 0.9 %  sodium chloride infusion  250 mL Intravenous PRN Hollice Espy, MD      . acetaminophen (TYLENOL) tablet 650 mg  650 mg Oral Q4H PRN Hollice Espy, MD      . amLODipine (NORVASC) tablet 5 mg  5 mg Oral Daily Hollice Espy, MD   5 mg at 09/18/14 1259  . aspirin chewable tablet 81 mg  81 mg Oral Daily Hollice Espy, MD   81 mg at 09/18/14 1259  . carvedilol (COREG) tablet 25 mg  25 mg Oral BID WC Hollice Espy, MD      . enoxaparin (LOVENOX) injection 40 mg  40 mg Subcutaneous Q24H Hollice Espy, MD      . furosemide (LASIX) injection 20 mg  20 mg Intravenous Q12H Hollice Espy, MD      . hydrALAZINE (APRESOLINE) injection 10 mg  10 mg Intravenous Q6H PRN Hollice Espy, MD      . lisinopril (PRINIVIL,ZESTRIL) tablet 5 mg  5 mg Oral Daily Hollice Espy, MD      . ondansetron [facility]) injection 4 mg  4 mg Intravenous Q6H PRN Hollice Espy, MD      . simvastatin (ZOCOR) tablet 20 mg  20 mg Oral Daily Hollice Espy, MD   20 mg at 09/18/14 1345  . sodium chloride 0.9 % injection 3 mL  3 mL Intravenous Q12H Hollice Espy, MD      . sodium chloride 0.9 % injection 3 mL  3 mL Intravenous PRN Hollice Espy, MD        Allergies: Allergies as of 09/18/2014  . (No Known Allergies)   Past Medical History  Diagnosis Date  . Hypertension    History reviewed. No pertinent past surgical history. No family history on file. History   Social History  . Marital Status: Single    Spouse Name: N/A  . Number of Children: N/A  . Years of Education: N/A   Occupational History  . Not on file.   Social History Main Topics  . Smoking status: Former Smoker -- 0.50 packs/day    Types: Cigarettes    Quit date: 05/20/2014  . Smokeless  tobacco: Not on file  . Alcohol Use: No  . Drug Use: Yes    Special: Marijuana  . Sexual Activity: Not on file   Other Topics Concern  . Not on file   Social History Narrative   Review of Systems: CONSTITUTIONAL- No Fever, weightloss, night sweat or change in appetite. SKIN- No Rash. GI- No nausea, vomiting, diarrhoea, constipation, abd pain. URINARY- No Frequency, urgency, straining or dysuria. NEUROLOGIC- No Numbness.  Physical Exam: Blood pressure 186/116, pulse 120, temperature 99.1 F (37.3 C), temperature source Oral, resp. rate 24, height  (1.803 m), weight 282 lb 8 oz (128.141 kg), SpO2 98 %. GENERAL- alert, co-operative, lying in bed, appears as stated age, not in any distress. HEENT- Atraumatic, normocephalic, EOMI, oral mucosa appears moist, neck supple. CARDIAC- Tachycardic, but regular, loud S2 heard in aortic area, no  Appreciable murmurs, or rubs, no JVD appreciated. RESP- Moving equal volumes of air, and basilar crackles- right lung . No wheezes. ABDOMEN- Soft, full but not distended, nontender, no palpable masses or organomegaly. BACK- Normal curvature of the spine. NEURO- Alert and oriented X3, No obvious Cr N abnormality. EXTREMITIES- pulse 2+, symmetric, trace pitting pedal edema. SKIN- Warm, dry, No rash or lesion.  Lab results: Basic Metabolic Panel:  Recent Labs  16/10/96 0900  NA 137  K 3.0*  CL 104  CO2 21*  GLUCOSE 108*  BUN 18  CREATININE 1.75*  CALCIUM 8.8*   CBC:  Recent Labs  09/18/14 0900  WBC 9.0  NEUTROABS 6.0  HGB 14.0  HCT 39.8  MCV 81.9  PLT 316   Cardiac Enzymes:  Recent Labs  09/18/14 0900  TROPONINI 0.54*   BNP: 09/18/2014- BNP- 391.7.  D-Dimer:  Recent Labs  09/18/14 0900  DDIMER 1.71*   Imaging results:  Dg Chest 2 View  09/18/2014   CLINICAL DATA:  Cough, short of breath, wheezing, smoking.  EXAM: CHEST  2 VIEW  COMPARISON:  None.  FINDINGS: Mild cardiomegaly. Mild central vascular congestion. No  pneumothorax. No pleural effusion. Bronchitic changes are likely chronic. Intact thoracic spine.  IMPRESSION: Cardiomegaly and mild central vascular congestion.   Electronically Signed   By: Jolaine Click M.D.   On: 09/18/2014 09:00   Ct Angio Chest Pe W/cm &/or Wo Cm  09/18/2014   CLINICAL DATA:  Shortness of Breath for 2 weeks. Chest pain for 1 week  EXAM: CT ANGIOGRAPHY  CHEST WITH CONTRAST  TECHNIQUE: Multidetector CT imaging of the chest was performed using the standard protocol during bolus administration of intravenous contrast. Multiplanar CT image reconstructions and MIPs were obtained to evaluate the vascular anatomy.  CONTRAST:  OMNIPAQUE IOHEXOL 350 MG/ML SOLN  COMPARISON:  Chest radiograph Sep 18, 2014  FINDINGS: There is no demonstrable pulmonary embolus. There is no thoracic aortic aneurysm or dissection.  There are rather minimal pleural effusions bilaterally. There is hazy ground-glass type opacity in both lower lobes as well as to a much lesser degree in the right upper and right middle lobes. There is no appreciable volume loss.  There is cardiomegaly. There is left ventricular hypertrophy. The pericardium is not thickened.  Thyroid appears normal.  There are several prominent lymph nodes throughout the mediastinum. The largest individual lymph node is in the right hilum measuring 2.4 x 1.8 cm. There is a lymph node in the left hilum measuring 1.6 x 1.5 cm. There are prominent lymph nodes adjacent to the aortic arch and in the sub- carinal region as well.  In the visualized upper abdomen, no lesions are identified. There are no blastic or lytic bone lesions.  Review of the MIP images confirms the above findings.  IMPRESSION: No demonstrable pulmonary embolus. Cardiomegaly with areas of patchy ground-glass opacity which likely represent mild edema and rather minimal pleural effusions. This appearance raises concern for a degree of underlying congestive heart failure. There is left ventricular  hypertrophy. There are several prominent lymph nodes of uncertain etiology. Given these prominent lymph nodes, a followup chest CT in 8-10 weeks may be reasonable.   Electronically Signed   By: Bretta Bang III M.D.   On: 09/18/2014 11:41    Other results: EKG: Rate 112bpm, regular, sinus rhythm, biatrial enlargement, PR interval- 138, QRS duration - 108, prolonged QTC- 506. repol abnormalities, but otherwise no St or T wave abnormalities, left ventricular hypertrophy, Axis- equivocal.   Assessment, Plan, & Recommendations by Problem:  Summary- 28 Y O M presented with SOB with exertion, PND and cough, physical exam notable for tachycardia, unilat crackles, without obvious signs of fluid overload, slight elevated DDImer, BNP, and trops, EKG, chest xray and CTA suggests cardiomegaly and lymphadenopathy but no PE.   Acute heart failure - Exact etiology at this time unknown- but most Likely related to chronic uncontrolled HTN. Patient does not appear markedly fluid overloaded.  Awaiting Echo, and will decides on further work up. Other Considerations, infiltrative diseases- Connective tissue diseases like Sarcoidosis- considering enlarged LN on CTA , idiopathic Cardiomyopathy considering family hx of cardiomegaly, hemochromatosis.  - Echo pending. - Trend troponins. - Consider Ruling out secondary causes of HTN- Renin- Aldo ratio, evaluate for RAS. - Consider Increasing lisinopril to 10mg  daily, and gradually uptitrate. - Consider lipid panel check, TSH, HIV, liver function, HGbA1c, and possible OSA eval as outpt. - Replace k, daily BMEts, daily weights. - Will continue lasix at 20mg  IV BID for now.  HTN- Per primary. Consider holding Coreg for now.   CKD 3- likely related to prolong uncontrolled HTN.   Dispo: Disposition is deferred at this time, awaiting improvement of current medical problems.   Signed: Onnie Boer, MD 09/18/2014, 3:49 PM  [facility] Resident PGY-2 [phone].  I  have seen and examined the patient along with Onnie Boer, MD.  I have reviewed the chart, notes and new data.  I agree with her note.  Key new complaints: he still has exertional dyspnea, NYHA class  III Key examination changes: no overt signs of hypervolemia - normal JVP, no S3, no rales, no edema Key new findings / data: cardiomegaly with spherical LV remodeling and LVH on CT chest. No coronary calcification. Normal origin of LMCA. ECG with LVH.  PLAN: Echo will be highly informative, but I expect will show a dilated left ventricle with depressed LVEF, possibly central mitral regurgitation. 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 Continue diuretics. Consider Bidil for BP control. His creatinine probably underestimates his real renal function - he is very young and muscular. I believe we could still use ACEi for CHF, but would delay since he received iodinated contrast for CTA and we are aggresively diuresing. Also plan use of carvedilol, but avoid high doses of beta blocker while still symptomatic. More detailed plan after echo is available.  Thurmon Fair, MD, [facility] [phone] 09/18/2014, 5:49 PM

## 2014-09-18 NOTE — ED Notes (Signed)
Called lab to add on Trop and BNP 

## 2014-09-18 NOTE — H&P (Signed)
History and Physical  Shawn [name] ZOX:[id number] DOB: [birth year] DOA: 09/18/2014  Referring physician: Thayer Ohm lawyer, ER PA PCP: No PCP Per Patient   Chief Complaint: Dyspnea on exertion  HPI: Shawn [name] is a 29 y.o. male  Past medical history of hypertension, diabetes and obesity who was started on blood pressure and cholesterol medications last year by urgent care, but stopped taking them. He had noted for the past week or 2 that he's been having increased dyspnea on exertion. No outright shortness of breath or chest pain. He became concerned and came into the emergency room today for further evaluation. In the emergency room he was noted to be mildly tachycardic with a blood pressure of 196/118. Labs were done patient was noted to have a creatinine of 1.75, previously at an 0.2 and a BNP of 390. Patient suspected to have acute CHF and started on IV Lasix. Hospitalists were called for further evaluation. D-dimer came back elevated at 1.7   Review of Systems:  Patient seen after arrival to floor. Pt complains of dyspnea on exertion and after getting blood pressure medication, lightheadedness.  Pt denies any headache, vision changes, dysphagia, chest pain, palpitations, shortness of breath or breast, wheezing, cough, abdominal pain, hematuria, dysuria, constipation, diarrhea, focal extremity numbness weakness or pain.  Review of systems are otherwise negative  Past Medical History  Diagnosis Date  . Hypertension    History reviewed. No pertinent past surgical history. Social History:  reports that he quit smoking about 3 months ago. His smoking use included Cigarettes. He smoked 0.50 packs per day. He does not have any smokeless tobacco history on file. He reports that he uses illicit drugs (Marijuana). He reports that he does not drink alcohol. Patient lives at home with his girlfriend & is able to participate in activities of daily living with output assistance  No Known  Allergies  Family history: Hypertension, cardiomegaly  Prior to Admission medications   Medication Sig Start Date End Date Taking? Authorizing Provider  amLODipine (NORVASC) 5 MG tablet Take 1 tablet (5 mg total) by mouth daily. 11/08/13  Yes Linna Hoff, MD  aspirin 81 MG tablet Take 81 mg by mouth daily.   Yes Historical Provider, MD  losartan-hydrochlorothiazide (HYZAAR) 50-12.5 MG per tablet Take 1 tablet by mouth daily. 05/01/14  Yes Hayden Rasmussen, NP  simvastatin (ZOCOR) 20 MG tablet Take 20 mg by mouth daily.   Yes Historical Provider, MD    Physical Exam: BP 162/94 mmHg  Pulse 116  Temp(Src) 99.1 F (37.3 C) (Oral)  Resp 24  Ht  (1.803 m)  Wt 128.141 kg (282 lb 8 oz)  BMI 39.42 kg/m2  SpO2 98%  General:  Alert and oriented 3, no acute distress Eyes: Sclera nonicteric, extraocular movements are intact ENT: Normocephalic and atraumatic, mucous members are moist Neck: No carotid bruits Cardiovascular: Regular rate and rhythm, S1-S2 Respiratory: Decreased breath sounds bibasilar Abdomen: Soft, obese, nontender, positive bowel sounds Skin: No skin breaks, tears or lesions Musculoskeletal: No clubbing or cyanosis, +1 pitting edema bilaterally Psychiatric: Patient is appropriate, no evidence of psychoses Neurologic: No focal deficits           Labs on Admission:  Basic Metabolic Panel:  Recent Labs Lab 09/18/14 0900  NA 137  K 3.0*  CL 104  CO2 21*  GLUCOSE 108*  BUN 18  CREATININE 1.75*  CALCIUM 8.8*   Liver Function Tests: No results for input(s): AST, ALT, ALKPHOS, BILITOT, PROT, ALBUMIN in  the last 168 hours. No results for input(s): LIPASE, AMYLASE in the last 168 hours. No results for input(s): AMMONIA in the last 168 hours. CBC:  Recent Labs Lab 09/18/14 0900  WBC 9.0  NEUTROABS 6.0  HGB 14.0  HCT 39.8  MCV 81.9  PLT 316   Cardiac Enzymes:  Recent Labs Lab 09/18/14 0900  TROPONINI 0.54*    BNP (last 3 results)  Recent Labs   09/18/14 0900  BNP 391.7*    ProBNP (last 3 results) No results for input(s): PROBNP in the last 8760 hours.  CBG: No results for input(s): GLUCAP in the last 168 hours.  Radiological Exams on Admission: Dg Chest 2 View  09/18/2014   CLINICAL DATA:  Cough, short of breath, wheezing, smoking.  EXAM: CHEST  2 VIEW  COMPARISON:  None.  FINDINGS: Mild cardiomegaly. Mild central vascular congestion. No pneumothorax. No pleural effusion. Bronchitic changes are likely chronic. Intact thoracic spine.  IMPRESSION: Cardiomegaly and mild central vascular congestion.   Electronically Signed   By: Jolaine Click M.D.   On: 09/18/2014 09:00   Ct Angio Chest Pe W/cm &/or Wo Cm  09/18/2014   CLINICAL DATA:  Shortness of Breath for 2 weeks. Chest pain for 1 week  EXAM: CT ANGIOGRAPHY CHEST WITH CONTRAST  TECHNIQUE: Multidetector CT imaging of the chest was performed using the standard protocol during bolus administration of intravenous contrast. Multiplanar CT image reconstructions and MIPs were obtained to evaluate the vascular anatomy.  CONTRAST:  OMNIPAQUE IOHEXOL 350 MG/ML SOLN  COMPARISON:  Chest radiograph Sep 18, 2014  FINDINGS: There is no demonstrable pulmonary embolus. There is no thoracic aortic aneurysm or dissection.  There are rather minimal pleural effusions bilaterally. There is hazy ground-glass type opacity in both lower lobes as well as to a much lesser degree in the right upper and right middle lobes. There is no appreciable volume loss.  There is cardiomegaly. There is left ventricular hypertrophy. The pericardium is not thickened.  Thyroid appears normal.  There are several prominent lymph nodes throughout the mediastinum. The largest individual lymph node is in the right hilum measuring 2.4 x 1.8 cm. There is a lymph node in the left hilum measuring 1.6 x 1.5 cm. There are prominent lymph nodes adjacent to the aortic arch and in the sub- carinal region as well.  In the visualized upper  abdomen, no lesions are identified. There are no blastic or lytic bone lesions.  Review of the MIP images confirms the above findings.  IMPRESSION: No demonstrable pulmonary embolus. Cardiomegaly with areas of patchy ground-glass opacity which likely represent mild edema and rather minimal pleural effusions. This appearance raises concern for a degree of underlying congestive heart failure. There is left ventricular hypertrophy. There are several prominent lymph nodes of uncertain etiology. Given these prominent lymph nodes, a followup chest CT in 8-10 weeks may be reasonable.   Electronically Signed   By: Bretta Bang III M.D.   On: 09/18/2014 11:41    EKG: Independently reviewed. Sinus tachycardia with biatrial enlargement, LVH and prolonged QT  Assessment/Plan Present on Admission:  . Acute systolic heart failure: Likely from hypertensive heart disease. Appreciate cardiology consult. On beta blocker and ACE inhibitor. Aggressively diuresing. For echocardiogram. Given elevated d-dimer, check a lower extremity Doppler  . Essential hypertension: ACE inhibitor, beta blocker and diuretic. Patient has been noncompliant.  . Obesity (BMI 30-39.9): Patient needs criteria with BMI greater than 30  . acute kidney injury in the setting of  CKD (chronic kidney disease) stage 2, GFR 60-89 ml/min: Some underlying renal insufficiency most likely from hypertensive disease. Monitor renal function as diuresing  . Elevated troponin: Cycling enzymes, although no evidence of ACS. Most likely this is falsely elevated in the setting of CHF   . Hyperlipidemia: Was previously on statin, recheck fasting lipid panel in the morning  . Hypokalemia: Replace as needed  Consultants: Cardiology  Code Status: Full code  Family Communication: Multiple family members at the bedside   Disposition Plan: Home once fully diuresed, CHF fully assessed and medications that, likely next few days  Time spent: 50  minutes  Hollice Espy [facility] Pager [phone]

## 2014-09-18 NOTE — ED Notes (Signed)
Pt placed on 2 L via nasal cannula. O2 sats 89% on RA.

## 2014-09-18 NOTE — ED Notes (Signed)
Pt states for last 2 weeks he's been coughing.  States he's sob with ambulation and when he lays down the coughing is worse.  Productive, thick sputum.  Denies any swelling.  States the first week he experienced chest pain, but denies sob at this time.

## 2014-09-18 NOTE — Progress Notes (Signed)
Pt admitted to room 3e30, pt alert and oriented and placed on tele.  BP 186/118, nothing ordered PRN for BP.  MD paged.  Will continue to monitor.

## 2014-09-18 NOTE — Progress Notes (Signed)
Pt. With order for stat US of bllateral lower extremities. Radiology stated that [place] to be completed in vascular lab. On call NP, K. Schorr notified.

## 2014-09-19 ENCOUNTER — Inpatient Hospital Stay (HOSPITAL_COMMUNITY): Payer: Medicaid Other

## 2014-09-19 DIAGNOSIS — I10 Essential (primary) hypertension: Secondary | ICD-10-CM

## 2014-09-19 DIAGNOSIS — I509 Heart failure, unspecified: Secondary | ICD-10-CM

## 2014-09-19 DIAGNOSIS — N189 Chronic kidney disease, unspecified: Secondary | ICD-10-CM | POA: Insufficient documentation

## 2014-09-19 DIAGNOSIS — E785 Hyperlipidemia, unspecified: Secondary | ICD-10-CM

## 2014-09-19 DIAGNOSIS — R0602 Shortness of breath: Secondary | ICD-10-CM

## 2014-09-19 DIAGNOSIS — I161 Hypertensive emergency: Secondary | ICD-10-CM | POA: Insufficient documentation

## 2014-09-19 DIAGNOSIS — N179 Acute kidney failure, unspecified: Secondary | ICD-10-CM | POA: Insufficient documentation

## 2014-09-19 DIAGNOSIS — E876 Hypokalemia: Secondary | ICD-10-CM

## 2014-09-19 DIAGNOSIS — E669 Obesity, unspecified: Secondary | ICD-10-CM

## 2014-09-19 LAB — TROPONIN I
Troponin I: 0.46 ng/mL — ABNORMAL HIGH (ref <0.031)
Troponin I: 0.4 ng/mL — ABNORMAL HIGH (ref <0.031)

## 2014-09-19 LAB — BASIC METABOLIC PANEL
Anion gap: 11 (ref 5–15)
BUN: 20 mg/dL (ref 6–20)
CALCIUM: 9 mg/dL (ref 8.9–10.3)
CO2: 25 mmol/L (ref 22–32)
CREATININE: 2.05 mg/dL — AB (ref 0.61–1.24)
Chloride: 103 mmol/L (ref 101–111)
GFR calc Af Amer: 49 mL/min — ABNORMAL LOW (ref >60)
GFR calc non Af Amer: 42 mL/min — ABNORMAL LOW (ref >60)
Glucose, Bld: 103 mg/dL — ABNORMAL HIGH (ref 70–99)
Potassium: 3.5 mmol/L (ref 3.5–5.1)
Sodium: 139 mmol/L (ref 135–145)

## 2014-09-19 MED ORDER — FUROSEMIDE 40 MG PO TABS
40.0000 mg | ORAL_TABLET | Freq: Every day | ORAL | Status: DC
  Administered 2014-09-20 – 2014-09-21 (×2): 40 mg via ORAL
  Filled 2014-09-19 (×2): qty 1

## 2014-09-19 MED ORDER — ENOXAPARIN SODIUM 60 MG/0.6ML ~~LOC~~ SOLN
60.0000 mg | SUBCUTANEOUS | Status: DC
  Administered 2014-09-19 – 2014-09-20 (×2): 60 mg via SUBCUTANEOUS
  Filled 2014-09-19 (×3): qty 0.6

## 2014-09-19 NOTE — Progress Notes (Signed)
PROGRESS NOTE    Shawn [name] UEA:[id number] DOB: 1985-07-04 DOA: 09/18/2014 PCP: No PCP Per Patient  HPI/Brief narrative 29 y.o. male past medical history of hypertension, diabetes and obesity who was started on blood pressure and cholesterol medications last year by urgent care, but stopped taking them. He had noted for the past week or 2 that he's been having increased dyspnea on exertion. No outright shortness of breath or chest pain. He became concerned and came into the emergency room today for further evaluation. In the emergency room he was noted to be mildly tachycardic with a blood pressure of 196/118. Labs were done patient was noted to have a creatinine of 1.75, previously at 1.5 and a BNP of 390. Patient suspected to have acute CHF and started on IV Lasix. Hospitalists were called for further evaluation. D-dimer came back elevated at 1.7 followed by CTA chest-negative for PE. Cardiologists were consulted.  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 - ACEI started on admission but creatinine has slightly worsened and hence will hold today. If creatinine stabilizes, resume ACEI tomorrow. - Hold off on Aldactone. - Continue BiDil - As per cardiology, 2-D echo: LVEF 25-30 percent and restrictive filling noted  Hypertensive emergency - Secondary to noncompliance with medications. Counseled extensively regarding this in the presence of his mother at bedside. He verbalized understanding. - Started on amlodipine, carvedilol, BiDil and ACEI. Blood pressure control improved. - Holding ACEI today secondary to worsening creatinine. Consider resuming in a.m. if creatinine stabilizes.  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  Elevated troponin - Likely from demand ischemia related to uncontrolled hypertension, acute CHF and acute on chronic kidney disease - Flat trend - 2-D echo results as above - Management as indicated above  Hyperlipidemia - Consider statins  Hypokalemia - Replaced and better  Obesity (BMI 30-39.9) - Patient meets criteria with BMI greater than 30   Lymphadenopathy on CT a chest  - Unclear significance  - As per radiologist's recommendation, follow up CT chest in 8-10 weeks    Code Status: Full Family Communication: Discussed with patient's mother at bedside Disposition Plan: DC home when medically stable, possibly in the next 24-48 hours. Still at risk for decline.   Consultants:  Cardiology  Procedures:  None  Antibiotics:  None   Subjective: Patient states that he feels a whole lot better. Denies dyspnea or chest pain.  Objective: Filed Vitals:   09/18/14 1925 09/18/14 2323 09/19/14 0519 09/19/14 1101  BP: 124/80 143/79 124/73 119/75  Pulse: 105 92 96   Temp:   98.3 F (36.8 C)   TempSrc: Oral  Oral   Resp: 16  18   Height:      Weight:   125.057 kg (275 lb 11.2 oz)   SpO2: 99%  99%     Intake/Output Summary (Last 24 hours) at 09/19/14 1148 Last data filed at 09/19/14 0920  Gross per 24 hour  Intake    760 ml  Output   1975 ml  Net  -1215 ml   Filed Weights   09/18/14 0757 09/18/14 1439 09/19/14 0519  Weight: 127.007 kg (280 lb) 128.141 kg (282 lb 8  oz) 125.057 kg (275 lb 11.2 oz)     Exam:  General exam: Pleasant young male ambulating comfortably in the room. Respiratory system: Clear. No increased work of breathing. Cardiovascular system: S1 & S2 heard, RRR. No JVD, murmurs, gallops, clicks or pedal edema. Telemetry: SR-ST 100s Gastrointestinal system: Abdomen is nondistended, soft and  nontender. Normal bowel sounds heard. Central nervous system: Alert and oriented. No focal neurological deficits. Extremities: Symmetric 5 x 5 power.   Data Reviewed: Basic Metabolic Panel:  Recent Labs Lab 09/18/14 0900 09/19/14 0534  NA 137 139  K 3.0* 3.5  CL 104 103  CO2 21* 25  GLUCOSE 108* 103*  BUN 18 20  CREATININE 1.75* 2.05*  CALCIUM 8.8* 9.0   Liver Function Tests: No results for input(s): AST, ALT, ALKPHOS, BILITOT, PROT, ALBUMIN in the last 168 hours. No results for input(s): LIPASE, AMYLASE in the last 168 hours. No results for input(s): AMMONIA in the last 168 hours. CBC:  Recent Labs Lab 09/18/14 0900  WBC 9.0  NEUTROABS 6.0  HGB 14.0  HCT 39.8  MCV 81.9  PLT 316   Cardiac Enzymes:  Recent Labs Lab 09/18/14 0900 09/18/14 1830 09/18/14 2346 09/19/14 0534  TROPONINI 0.54* 0.56* 0.46* 0.40*   BNP (last 3 results) No results for input(s): PROBNP in the last 8760 hours. CBG:  Recent Labs Lab 09/18/14 2004  GLUCAP 122*    No results found for this or any previous visit (from the past 240 hour(s)).      Studies: Dg Chest 2 View  09/18/2014   CLINICAL DATA:  Cough, short of breath, wheezing, smoking.  EXAM: CHEST  2 VIEW  COMPARISON:  None.  FINDINGS: Mild cardiomegaly. Mild central vascular congestion. No pneumothorax. No pleural effusion. Bronchitic changes are likely chronic. Intact thoracic spine.  IMPRESSION: Cardiomegaly and mild central vascular congestion.   Electronically Signed   By: Jolaine Click M.D.   On: 09/18/2014 09:00   Ct Angio Chest Pe W/cm &/or Wo Cm  09/18/2014   CLINICAL DATA:  Shortness of Breath for 2 weeks. Chest pain for 1 week  EXAM: CT ANGIOGRAPHY CHEST WITH CONTRAST  TECHNIQUE: Multidetector CT imaging of the chest was performed using the standard protocol during bolus administration of intravenous contrast. Multiplanar CT image reconstructions and MIPs were obtained to evaluate the vascular anatomy.  CONTRAST:   OMNIPAQUE IOHEXOL 350 MG/ML SOLN  COMPARISON:  Chest radiograph Sep 18, 2014  FINDINGS: There is no demonstrable pulmonary embolus. There is no thoracic aortic aneurysm or dissection.  There are rather minimal pleural effusions bilaterally. There is hazy ground-glass type opacity in both lower lobes as well as to a much lesser degree in the right upper and right middle lobes. There is no appreciable volume loss.  There is cardiomegaly. There is left ventricular hypertrophy. The pericardium is not thickened.  Thyroid appears normal.  There are several prominent lymph nodes throughout the mediastinum. The largest individual lymph node is in the right hilum measuring 2.4 x 1.8 cm. There is a lymph node in the left hilum measuring 1.6 x 1.5 cm. There are prominent lymph nodes adjacent to the aortic arch and in the sub- carinal region as well.  In the visualized upper abdomen, no lesions are identified. There are no blastic or lytic bone lesions.  Review of the MIP images confirms the above findings.  IMPRESSION: No demonstrable pulmonary embolus. Cardiomegaly with areas of patchy ground-glass opacity which likely represent mild edema and rather minimal  pleural effusions. This appearance raises concern for a degree of underlying congestive heart failure. There is left ventricular hypertrophy. There are several prominent lymph nodes of uncertain etiology. Given these prominent lymph nodes, a followup chest CT in 8-10 weeks may be reasonable.   Electronically Signed   By: Bretta Bang III M.D.   On: 09/18/2014 11:41        Scheduled Meds: . amLODipine  5 mg Oral Daily  . aspirin  81 mg Oral Daily  . carvedilol  25 mg Oral BID WC  . enoxaparin (LOVENOX) injection  60 mg Subcutaneous Q24H  . [START ON 09/20/2014] furosemide  40 mg Oral Daily  . isosorbide-hydrALAZINE  1 tablet Oral TID  . simvastatin  20 mg Oral Daily  . sodium chloride  3 mL Intravenous Q12H   Continuous Infusions:   Principal  Problem:   Acute systolic heart failure Active Problems:   Essential hypertension   Obesity (BMI 30-39.9)   CKD (chronic kidney disease) stage 2, GFR 60-89 ml/min   Elevated troponin   AKI (acute kidney injury)   Noncompliance   Hyperlipidemia   Hypokalemia   Essential hypertension, malignant    Time spent: 45 minutes    Ed Rayson, MD, FACP, FHM. [facility] Pager [phone]  If 7PM-7AM, please contact night-coverage [email or web address] Password [email or web address] 09/19/2014, 11:48 AM    LOS: 1 day

## 2014-09-19 NOTE — Progress Notes (Signed)
Heart Failure Navigator Consult Note  Presentation: Shawn [name] is a 29 y.o. male past medical history of hypertension, diabetes and obesity who was started on blood pressure and cholesterol medications last year by urgent care, but stopped taking them. He had noted for the past week or 2 that he's been having increased dyspnea on exertion. No outright shortness of breath or chest pain. He became concerned and came into the emergency room today for further evaluation. In the emergency room he was noted to be mildly tachycardic with a blood pressure of 196/118. Labs were done patient was noted to have a creatinine of 1.75, previously at an 0.2 and a BNP of 390. Patient suspected to have acute CHF and started on IV Lasix. Hospitalists were called for further evaluation. D-dimer came back elevated at 1.7   Past Medical History  Diagnosis Date  . Hypertension     History   Social History  . Marital Status: Single    Spouse Name: N/A  . Number of Children: N/A  . Years of Education: N/A   Social History Main Topics  . Smoking status: Former Smoker -- 0.50 packs/day    Types: Cigarettes    Quit date: 05/20/2014  . Smokeless tobacco: Not on file  . Alcohol Use: No  . Drug Use: Yes    Special: Marijuana  . Sexual Activity: Not on file   Other Topics Concern  . None   Social History Narrative    ECHO:Study Conclusions-09/19/14  - Left ventricle: The cavity size was severely dilated. Wall thickness was increased in a pattern of moderate LVH. Systolic function was moderately reduced. The estimated ejection fraction was in the range of 35% to 40%. Diffuse hypokinesis. Akinesis of the basalinferior myocardium. - Aortic valve: There was trivial regurgitation. - Mitral valve: There was mild regurgitation. - Pericardium, extracardiac: A small pericardial effusion was identified circumferential to the heart.  Transthoracic echocardiography. M-mode, complete 2D,  spectral Doppler, and color Doppler. Birthdate: Patient birthdate: [birth year]. Age: Patient is 29 yr old. Sex: Gender: male. BMI: 38.4 kg/m^2. Blood pressure:   123/74 Patient status: Inpatient. Study date: Study date: 09/19/2014. Study time: 07:55 AM. Location: Bedside.  BNP    Component Value Date/Time   BNP 391.7* 09/18/2014 0900    ProBNP No results found for: PROBNP   Education Assessment and Provision:  Detailed education and instructions provided on heart failure disease management including the following:  Signs and symptoms of Heart Failure When to call the physician Importance of daily weights Low sodium diet Fluid restriction Medication management Anticipated future follow-up appointments  Patient education given on each of the above topics.  Patient acknowledges understanding and acceptance of all instructions.  I spoke at length with Shawn [name] regarding his new HF diagnosis.  He acknowledges that he has had high BP since he was 29 years old.  He has not taken "medications for a while".  He does seem very motivated to make changes necessary for his health.  He is able to teach back all above topics.  We spoke at length regarding low sodium diet and high sodium foods to avoid.  He currently has no insurance--yet says that he will be able to get medications at discharge.  He does say that he wants to establish a PCP and I will coordinate that with Care Management.  I plan to come back to reinforce education prior to discharge.  I will also consult dietician for more information/education regarding education.  Education Materials:  "  Living Better With Heart Failure" Booklet, Daily Weight Tracker Tool and Heart Failure Educational Video.   High Risk Criteria for Readmission and/or Poor Patient Outcomes:  (Recommend Follow-up with [facility])--yes -would benefit from [facility] Clinic referral for ongoing support and education   EF <30%- no  35-40%  2 or more admissions in 6 months- No--new HF  Difficult social situation- No- Plans to return to home with mother  Demonstrates medication noncompliance- Yes --has "not taken BP Medications for a while"    Barriers of Care:  Knowledge--New HF and compliance  Discharge Planning:   Plans to go home with Mother

## 2014-09-19 NOTE — Progress Notes (Signed)
Patient Name: Shawn [name] Date of Encounter: 09/19/2014  Principal Problem:   Acute systolic heart failure Active Problems:   Essential hypertension   Obesity (BMI 30-39.9)   CKD (chronic kidney disease) stage 2, GFR 60-89 ml/min   Elevated troponin   AKI (acute kidney injury)   Noncompliance   Hyperlipidemia   Hypokalemia   Length of Stay: 1  SUBJECTIVE  Feels much better, no dyspnea BP excellent Creatinine increasing  I have reviewed echo myself Offical report LVEF 35-40%. I believe it is 25-30%. Restrictive filling is noted.   CURRENT MEDS . amLODipine  5 mg Oral Daily  . aspirin  81 mg Oral Daily  . carvedilol  25 mg Oral BID WC  . enoxaparin (LOVENOX) injection  60 mg Subcutaneous Q24H  . [START ON 09/20/2014] furosemide  40 mg Oral Daily  . isosorbide-hydrALAZINE  1 tablet Oral TID  . simvastatin  20 mg Oral Daily  . sodium chloride  3 mL Intravenous Q12H    OBJECTIVE   Intake/Output Summary (Last 24 hours) at 09/19/14 1123 Last data filed at 09/19/14 0920  Gross per 24 hour  Intake    760 ml  Output   1975 ml  Net  -1215 ml   Filed Weights   09/18/14 0757 09/18/14 1439 09/19/14 0519  Weight: 280 lb (127.007 kg) 282 lb 8 oz (128.141 kg) 275 lb 11.2 oz (125.057 kg)    PHYSICAL EXAM Filed Vitals:   09/18/14 1925 09/18/14 2323 09/19/14 0519 09/19/14 1101  BP: 124/80 143/79 124/73 119/75  Pulse: 105 92 96   Temp:   98.3 F (36.8 C)   TempSrc: Oral  Oral   Resp: 16  18   Height:      Weight:   275 lb 11.2 oz (125.057 kg)   SpO2: 99%  99%    General: Alert, oriented x3, no distress Head: no evidence of trauma, PERRL, EOMI, no exophtalmos or lid lag, no myxedema, no xanthelasma; normal ears, nose and oropharynx Neck: normal jugular venous pulsations and no hepatojugular reflux; brisk carotid pulses without delay and no carotid bruits Chest: clear to auscultation, no signs of consolidation by percussion or palpation, normal fremitus, symmetrical  and full respiratory excursions Cardiovascular: normal position and quality of the apical impulse, regular rhythm, normal first and second heart sounds, no rub, +S3 gallop, no murmur Abdomen: no tenderness or distention, no masses by palpation, no abnormal pulsatility or arterial bruits, normal bowel sounds, no hepatosplenomegaly Extremities: no clubbing, cyanosis or edema; 2+ radial, ulnar and brachial pulses bilaterally; 2+ right femoral, posterior tibial and dorsalis pedis pulses; 2+ left femoral, posterior tibial and dorsalis pedis pulses; no subclavian or femoral bruits Neurological: grossly nonfocal  LABS  CBC  Recent Labs  09/18/14 0900  WBC 9.0  NEUTROABS 6.0  HGB 14.0  HCT 39.8  MCV 81.9  PLT 316   Basic Metabolic Panel  Recent Labs  09/18/14 0900 09/19/14 0534  NA 137 139  K 3.0* 3.5  CL 104 103  CO2 21* 25  GLUCOSE 108* 103*  BUN 18 20  CREATININE 1.75* 2.05*  CALCIUM 8.8* 9.0   Liver Function Tests No results for input(s): AST, ALT, ALKPHOS, BILITOT, PROT, ALBUMIN in the last 72 hours. No results for input(s): LIPASE, AMYLASE in the last 72 hours. Cardiac Enzymes  Recent Labs  09/18/14 1830 09/18/14 2346 09/19/14 0534  TROPONINI 0.56* 0.46* 0.40*   BNP Invalid input(s): POCBNP D-Dimer  Recent Labs  09/18/14 0900  DDIMER 1.71*  Thyroid Function Tests  Recent Labs  09/18/14 1600  TSH 2.057    Radiology Studies Imaging results have been reviewed and Dg Chest 2 View  09/18/2014   CLINICAL DATA:  Cough, short of breath, wheezing, smoking.  EXAM: CHEST  2 VIEW  COMPARISON:  None.  FINDINGS: Mild cardiomegaly. Mild central vascular congestion. No pneumothorax. No pleural effusion. Bronchitic changes are likely chronic. Intact thoracic spine.  IMPRESSION: Cardiomegaly and mild central vascular congestion.   Electronically Signed   By: Jolaine Click M.D.   On: 09/18/2014 09:00   Ct Angio Chest Pe W/cm &/or Wo Cm  09/18/2014   CLINICAL DATA:   Shortness of Breath for 2 weeks. Chest pain for 1 week  EXAM: CT ANGIOGRAPHY CHEST WITH CONTRAST  TECHNIQUE: Multidetector CT imaging of the chest was performed using the standard protocol during bolus administration of intravenous contrast. Multiplanar CT image reconstructions and MIPs were obtained to evaluate the vascular anatomy.  CONTRAST:  OMNIPAQUE IOHEXOL 350 MG/ML SOLN  COMPARISON:  Chest radiograph Sep 18, 2014  FINDINGS: There is no demonstrable pulmonary embolus. There is no thoracic aortic aneurysm or dissection.  There are rather minimal pleural effusions bilaterally. There is hazy ground-glass type opacity in both lower lobes as well as to a much lesser degree in the right upper and right middle lobes. There is no appreciable volume loss.  There is cardiomegaly. There is left ventricular hypertrophy. The pericardium is not thickened.  Thyroid appears normal.  There are several prominent lymph nodes throughout the mediastinum. The largest individual lymph node is in the right hilum measuring 2.4 x 1.8 cm. There is a lymph node in the left hilum measuring 1.6 x 1.5 cm. There are prominent lymph nodes adjacent to the aortic arch and in the sub- carinal region as well.  In the visualized upper abdomen, no lesions are identified. There are no blastic or lytic bone lesions.  Review of the MIP images confirms the above findings.  IMPRESSION: No demonstrable pulmonary embolus. Cardiomegaly with areas of patchy ground-glass opacity which likely represent mild edema and rather minimal pleural effusions. This appearance raises concern for a degree of underlying congestive heart failure. There is left ventricular hypertrophy. There are several prominent lymph nodes of uncertain etiology. Given these prominent lymph nodes, a followup chest CT in 8-10 weeks may be reasonable.   Electronically Signed   By: Bretta Bang III M.D.   On: 09/18/2014 11:41    TELE NSR   ASSESSMENT AND PLAN  Probably  euvolemic, markedly improved BP. Hold ACEi today. Switch to PO diuretics.  If creat stabilizes, resume ACEI tomorrow. Hold off from spironolactone. Rx cost will be an issue - prefer generic hydralazine and long acting nitrates.  Reevaluate LVEF by echo in 3-6 months. Hopefully will see substantial improvement.   Thurmon Fair, MD, [facility] [phone] office [phone] pager 09/19/2014 11:23 AM

## 2014-09-19 NOTE — Progress Notes (Signed)
VASCULAR LAB PRELIMINARY  PRELIMINARY  PRELIMINARY  PRELIMINARY  Bilateral lower extremity venous Dopplers completed.    Preliminary report:  There is no DVT or SVT noted in the bilateral lower extremities.   Dijon Cosens, RVT 09/19/2014, 11:49 AM

## 2014-09-19 NOTE — Progress Notes (Signed)
  Echocardiogram 2D Echocardiogram has been performed.  Janalyn Harder 09/19/2014, 8:27 AM

## 2014-09-19 NOTE — Care Management Note (Signed)
Case Management Note  Patient Details  Name: Shawn [name] MRN: [id number] Date of Birth: [birth year]  Subjective/Objective:       Pt admitted on 09/18/14 with CHF, hypertension, AKI.  PTA, pt independent, lives with mother.               Action/Plan: Met with pt and mother to discuss dc needs, follow up.  Pt/mother agreeable to PCP follow up at [facility] and [facility].  Will make appointment upon discharge.  Pt has no insurance, and will need assistance with medications.  He is eligible for med assist through [facility] program.  Will provide [facility] letter on day of dc.  Will follow up.    Expected Discharge Date:  09/21/14               Expected Discharge Plan:  Home/Self Care  In-House Referral:     Discharge planning Services  CM Consult, [facility], [facility] Program, Medication Assistance  Post Acute Care Choice:    Choice offered to:     DME Arranged:    DME Agency:     HH Arranged:    HH Agency:     Status of Service:  In process, will continue to follow  Medicare Important Message Given:    Date Medicare IM Given:    Medicare IM give by:    Date Additional Medicare IM Given:    Additional Medicare Important Message give by:     If discussed at [place] of Stay Meetings, dates discussed:    Additional Comments:  Ella Bodo, RN 09/19/2014, 4:33 PM Phone [phone]

## 2014-09-20 ENCOUNTER — Inpatient Hospital Stay (HOSPITAL_COMMUNITY): Payer: Medicaid Other

## 2014-09-20 DIAGNOSIS — N189 Chronic kidney disease, unspecified: Secondary | ICD-10-CM

## 2014-09-20 DIAGNOSIS — I509 Heart failure, unspecified: Secondary | ICD-10-CM

## 2014-09-20 DIAGNOSIS — I130 Hypertensive heart and chronic kidney disease with heart failure and stage 1 through stage 4 chronic kidney disease, or unspecified chronic kidney disease: Secondary | ICD-10-CM

## 2014-09-20 DIAGNOSIS — I120 Hypertensive chronic kidney disease with stage 5 chronic kidney disease or end stage renal disease: Secondary | ICD-10-CM

## 2014-09-20 DIAGNOSIS — N183 Chronic kidney disease, stage 3 (moderate): Secondary | ICD-10-CM

## 2014-09-20 DIAGNOSIS — N179 Acute kidney failure, unspecified: Secondary | ICD-10-CM

## 2014-09-20 LAB — BASIC METABOLIC PANEL
ANION GAP: 10 (ref 5–15)
BUN: 28 mg/dL — ABNORMAL HIGH (ref 6–20)
CALCIUM: 8.7 mg/dL — AB (ref 8.9–10.3)
CO2: 25 mmol/L (ref 22–32)
CREATININE: 2.2 mg/dL — AB (ref 0.61–1.24)
Chloride: 102 mmol/L (ref 101–111)
GFR, EST AFRICAN AMERICAN: 45 mL/min — AB (ref >60)
GFR, EST NON AFRICAN AMERICAN: 39 mL/min — AB (ref >60)
Glucose, Bld: 101 mg/dL — ABNORMAL HIGH (ref 70–99)
Potassium: 3.2 mmol/L — ABNORMAL LOW (ref 3.5–5.1)
SODIUM: 137 mmol/L (ref 135–145)

## 2014-09-20 MED ORDER — ISOSORBIDE MONONITRATE ER 60 MG PO TB24
60.0000 mg | ORAL_TABLET | Freq: Two times a day (BID) | ORAL | Status: DC
  Administered 2014-09-20 – 2014-09-21 (×2): 60 mg via ORAL
  Filled 2014-09-20 (×3): qty 1

## 2014-09-20 MED ORDER — POTASSIUM CHLORIDE CRYS ER 20 MEQ PO TBCR
40.0000 meq | EXTENDED_RELEASE_TABLET | Freq: Once | ORAL | Status: AC
  Administered 2014-09-20: 40 meq via ORAL
  Filled 2014-09-20: qty 2

## 2014-09-20 MED ORDER — HYDRALAZINE HCL 50 MG PO TABS
50.0000 mg | ORAL_TABLET | Freq: Three times a day (TID) | ORAL | Status: DC
  Administered 2014-09-20 – 2014-09-21 (×3): 50 mg via ORAL
  Filled 2014-09-20 (×6): qty 1

## 2014-09-20 NOTE — Progress Notes (Signed)
PROGRESS NOTE    Shawn [name] ZOX:[id number] DOB: Apr 12, 1986 DOA: 09/18/2014 PCP: No PCP Per Patient  HPI/Brief narrative 29 y.o. male past medical history of hypertension, diabetes and obesity who was started on blood pressure and cholesterol medications last year by urgent care, but stopped taking them. He had noted for the past week or 2 that he's been having increased dyspnea on exertion. No outright shortness of breath or chest pain. He became concerned and came into the emergency room today for further evaluation. In the emergency room he was noted to be mildly tachycardic with a blood pressure of 196/118. Labs were done patient was noted to have a creatinine of 1.75, previously at 1.5 and a BNP of 390. Patient suspected to have acute CHF and started on IV Lasix. Hospitalists were called for further evaluation. D-dimer came back elevated at 1.7 followed by CTA chest-negative for PE. Cardiologists were consulted.  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 If creatinine stabilizes, resume ACEI -possibly outpatient. - Hold off on Aldactone. - Continue BiDil-cannot afford and hence switched to hydralazine and Imdur - As per cardiology, 2-D echo: LVEF 25-30 percent and restrictive filling noted - Improved.  Hypertensive emergency - Secondary to noncompliance with medications. Counseled extensively regarding this in the presence of his mother at bedside. He verbalized understanding. - Started on amlodipine, carvedilol, BiDil. Blood pressure control improved. 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 Follow BMP in a.m.  Elevated troponin - Likely from demand ischemia related to uncontrolled hypertension, acute CHF and acute on chronic kidney disease - Flat trend - 2-D echo results as above - Management as indicated above  Hyperlipidemia - Consider statins  Hypokalemia - Replace as needed  Obesity (BMI 30-39.9) - Patient meets criteria with BMI greater than 30   Lymphadenopathy on CT a chest  - Unclear significance  - As per radiologist's recommendation, follow up CT chest in 8-10 weeks    Code Status: Full Family Communication: Discussed with patient's mother at bedside Disposition Plan: DC home when medically stable, possibly in the next 24 hours.   Consultants:  Cardiology  Procedures:  None  Antibiotics:  None   Subjective: Denies dyspnea or chest pain.  Objective: Filed Vitals:   09/20/14 0651 09/20/14 1056 09/20/14 1519 09/20/14 1708  BP: 142/79 146/79 126/79 121/71  Pulse: 101 102 98 101  Temp: 97.9 F (36.6 C)  98.4 F (36.9 C)   TempSrc: Oral  Oral   Resp: 16  16   Height:      Weight: 125.556 kg (276 lb 12.8 oz)     SpO2: 100%  98%     Intake/Output Summary (Last 24 hours) at 09/20/14 1721 Last data filed at 09/20/14 1311  Gross per 24 hour  Intake   1780 ml  Output    350 ml  Net   1430 ml   Filed Weights   09/18/14 1439 09/19/14 0519 09/20/14 0651  Weight: 128.141 kg (282 lb 8 oz)  125.057 kg (275 lb 11.2 oz) 125.556 kg (276 lb 12.8 oz)     Exam:  General exam: Pleasant young male ambulating comfortably in the room. Respiratory system: Clear. No increased work of breathing. Cardiovascular system: S1 & S2 heard, RRR. No JVD, murmurs, gallops, clicks or pedal edema. Telemetry: SR-ST 100s Gastrointestinal  system: Abdomen is nondistended, soft and nontender. Normal bowel sounds heard. Central nervous system: Alert and oriented. No focal neurological deficits. Extremities: Symmetric 5 x 5 power.   Data Reviewed: Basic Metabolic Panel:  Recent Labs Lab 09/18/14 0900 09/19/14 0534 09/20/14 0316  NA 137 139 137  K 3.0* 3.5 3.2*  CL 104 103 102  CO2 21* 25 25  GLUCOSE 108* 103* 101*  BUN 18 20 28*  CREATININE 1.75* 2.05* 2.20*  CALCIUM 8.8* 9.0 8.7*   Liver Function Tests: No results for input(s): AST, ALT, ALKPHOS, BILITOT, PROT, ALBUMIN in the last 168 hours. No results for input(s): LIPASE, AMYLASE in the last 168 hours. No results for input(s): AMMONIA in the last 168 hours. CBC:  Recent Labs Lab 09/18/14 0900  WBC 9.0  NEUTROABS 6.0  HGB 14.0  HCT 39.8  MCV 81.9  PLT 316   Cardiac Enzymes:  Recent Labs Lab 09/18/14 0900 09/18/14 1830 09/18/14 2346 09/19/14 0534  TROPONINI 0.54* 0.56* 0.46* 0.40*   BNP (last 3 results) No results for input(s): PROBNP in the last 8760 hours. CBG:  Recent Labs Lab 09/18/14 2004  GLUCAP 122*    No results found for this or any previous visit (from the past 240 hour(s)).      Studies: US Renal  09/20/2014   CLINICAL DATA:  29 year old male with chronic kidney disease, hypertension. Initial encounter.  EXAM: RENAL / URINARY TRACT ULTRASOUND COMPLETE  COMPARISON:  Chest CTA 09/18/2014.  FINDINGS: Right Kidney:  Length: 12.7 cm. Echogenicity within normal limits. No mass or hydronephrosis visualized.  Left Kidney:  Length: 12.4 cm. Cortical echogenicity is within normal limits and there is no hydronephrosis. There is a simple appearing midpole cyst up to 37 mm diameter (image 27). Possible small nearby 9-10 mm simple cyst (image 29)  Bladder:  Appears normal for degree of bladder distention.  IMPRESSION: Negative sonographic appearance of the kidneys and bladder.   Electronically Signed   By: Odessa Fleming M.D.   On: 09/20/2014 12:30          Scheduled Meds: . amLODipine  5 mg Oral Daily  . aspirin  81 mg Oral Daily  . carvedilol  25 mg Oral BID WC  . enoxaparin (LOVENOX) injection  60 mg Subcutaneous Q24H  . furosemide  40 mg Oral Daily  . hydrALAZINE  50 mg Oral 3 times per day  . isosorbide mononitrate  60 mg Oral BID  . simvastatin  20 mg Oral Daily  . sodium chloride  3 mL Intravenous Q12H   Continuous Infusions:   Principal Problem:   Acute systolic heart failure Active Problems:   Essential hypertension   Obesity (BMI 30-39.9)   CKD (chronic kidney disease) stage 2, GFR 60-89 ml/min   Elevated troponin   AKI (acute kidney injury)   Noncompliance   Hyperlipidemia   Hypokalemia   Essential hypertension, malignant   Renal failure (ARF), acute on chronic   Hypertensive emergency   Malignant hypertension, heart failure & chronic kidney dis stage II    Time spent: 25 minutes    Siara Gorder, MD, FACP, FHM. [facility] Pager [phone]  If 7PM-7AM, please contact  night-coverage [email or web address] Password [email or web address] 09/20/2014, 5:21 PM    LOS: 2 days

## 2014-09-20 NOTE — Progress Notes (Signed)
Nutrition Education Note  RD consulted for nutrition education regarding new onset CHF.  RD reviewed "Heart Failure Nutrition Therapy" handout from the Academy of [employer]. Reviewed patient's dietary recall. Provided examples on ways to decrease sodium intake in diet. Discouraged intake of processed foods and use of salt shaker. Encouraged fresh fruits and vegetables as well as whole grain sources of carbohydrates to maximize fiber intake. Discussed reading nutrition labels and sodium guidelines to look for. Reviewed high sodium foods to avoid and low sodium foods pt can eat more often.   RD discussed why it is important for patient to adhere to diet recommendations, and emphasized the role of fluids, foods to avoid, and importance of weighing self daily. Teach back method used.  Expect good compliance.  Body mass index is 38.62 kg/(m^2). Pt meets criteria for Obesity based on current BMI.  Current diet order is Heart Healthy, patient is consuming approximately 100% of meals at this time. Labs and medications reviewed. No further nutrition interventions warranted at this time. RD contact information provided. If additional nutrition issues arise, please re-consult RD.   Ian Malkin RD, LDN Inpatient Clinical Dietitian Pager: [phone] After Hours Pager: [phone]

## 2014-09-20 NOTE — Progress Notes (Addendum)
Patient Name: ZAMAR ODWYER Date of Encounter: 09/20/2014   SUBJECTIVE  He feels much better. Denies CP, SOB, orthopnea, headache or dizziness.   CURRENT MEDS . amLODipine  5 mg Oral Daily  . aspirin  81 mg Oral Daily  . carvedilol  25 mg Oral BID WC  . enoxaparin (LOVENOX) injection  60 mg Subcutaneous Q24H  . furosemide  40 mg Oral Daily  . isosorbide-hydrALAZINE  1 tablet Oral TID  . simvastatin  20 mg Oral Daily  . sodium chloride  3 mL Intravenous Q12H    OBJECTIVE  Filed Vitals:   09/19/14 2146 09/20/14 0236 09/20/14 0651 09/20/14 1056  BP: 119/58 128/63 142/79 146/79  Pulse: 94 96 101 102  Temp: 98 F (36.7 C) 98.2 F (36.8 C) 97.9 F (36.6 C)   TempSrc: Oral Oral Oral   Resp: Height:      Weight:   276 lb 12.8 oz (125.556 kg)   SpO2: 100% 100% 100%     Intake/Output Summary (Last 24 hours) at 09/20/14 1427 Last data filed at 09/20/14 1311  Gross per 24 hour  Intake   1780 ml  Output    350 ml  Net   1430 ml   Filed Weights   09/18/14 1439 09/19/14 0519 09/20/14 0651  Weight: 282 lb 8 oz (128.141 kg) 275 lb 11.2 oz (125.057 kg) 276 lb 12.8 oz (125.556 kg)    PHYSICAL EXAM  General: Pleasant, NAD. Neuro: Alert and oriented X 3. Moves all extremities spontaneously. Psych: Normal affect. HEENT:  Normal  Neck: Normal jugular venous pulsations and no hepatojugular reflux; brisk carotid pulses without delay and no carotid bruits. Lungs:  Resp regular and unlabored, CTA. Heart: RRR no s3, s4, or murmurs. Abdomen: Soft, non-tender, non-distended, BS + x 4.  Extremities: No clubbing, cyanosis or edema. DP/PT/Radials 2+ and equal bilaterally.  Accessory Clinical Findings  CBC  Recent Labs  09/18/14 0900  WBC 9.0  NEUTROABS 6.0  HGB 14.0  HCT 39.8  MCV 81.9  PLT 316   Basic Metabolic Panel  Recent Labs  09/19/14 0534 09/20/14 0316  NA 139 137  K 3.5 3.2*  CL 103 102  CO2 25 25  GLUCOSE 103* 101*  BUN 20 28*    CREATININE 2.05* 2.20*  CALCIUM 9.0 8.7*     Recent Labs  09/18/14 1830 09/18/14 2346 09/19/14 0534  TROPONINI 0.56* 0.46* 0.40*    D-Dimer  Recent Labs  09/18/14 0900  DDIMER 1.71*    Thyroid Function Tests  Recent Labs  09/18/14 1600  TSH 2.057    TELE  NSR with HR in 90s. Occasional sinus tachy to 120s.   Radiology/Studies  Dg Chest 2 View  09/18/2014   CLINICAL DATA:  Cough, short of breath, wheezing, smoking.  EXAM: CHEST  2 VIEW  COMPARISON:  None.  FINDINGS: Mild cardiomegaly. Mild central vascular congestion. No pneumothorax. No pleural effusion. Bronchitic changes are likely chronic. Intact thoracic spine.  IMPRESSION: Cardiomegaly and mild central vascular congestion.   Electronically Signed   By: Jolaine Click M.D.   On: 09/18/2014 09:00   Ct Angio Chest Pe W/cm &/or Wo Cm  09/18/2014   CLINICAL DATA:  Shortness of Breath for 2 weeks. Chest pain for 1 week  EXAM: CT ANGIOGRAPHY CHEST WITH CONTRAST  TECHNIQUE: Multidetector CT imaging of the chest was performed using the standard protocol during bolus administration of intravenous contrast. Multiplanar CT image reconstructions and  MIPs were obtained to evaluate the vascular anatomy.  CONTRAST:  OMNIPAQUE IOHEXOL 350 MG/ML SOLN  COMPARISON:  Chest radiograph Sep 18, 2014  FINDINGS: There is no demonstrable pulmonary embolus. There is no thoracic aortic aneurysm or dissection.  There are rather minimal pleural effusions bilaterally. There is hazy ground-glass type opacity in both lower lobes as well as to a much lesser degree in the right upper and right middle lobes. There is no appreciable volume loss.  There is cardiomegaly. There is left ventricular hypertrophy. The pericardium is not thickened.  Thyroid appears normal.  There are several prominent lymph nodes throughout the mediastinum. The largest individual lymph node is in the right hilum measuring 2.4 x 1.8 cm. There is a lymph node in the left hilum  measuring 1.6 x 1.5 cm. There are prominent lymph nodes adjacent to the aortic arch and in the sub- carinal region as well.  In the visualized upper abdomen, no lesions are identified. There are no blastic or lytic bone lesions.  Review of the MIP images confirms the above findings.  IMPRESSION: No demonstrable pulmonary embolus. Cardiomegaly with areas of patchy ground-glass opacity which likely represent mild edema and rather minimal pleural effusions. This appearance raises concern for a degree of underlying congestive heart failure. There is left ventricular hypertrophy. There are several prominent lymph nodes of uncertain etiology. Given these prominent lymph nodes, a followup chest CT in 8-10 weeks may be reasonable.   Electronically Signed   By: Bretta Bang III M.D.   On: 09/18/2014 11:41   US Renal  09/20/2014   CLINICAL DATA:  29 year old male with chronic kidney disease, hypertension. Initial encounter.  EXAM: RENAL / URINARY TRACT ULTRASOUND COMPLETE  COMPARISON:  Chest CTA 09/18/2014.  FINDINGS: Right Kidney:  Length: 12.7 cm. Echogenicity within normal limits. No mass or hydronephrosis visualized.  Left Kidney:  Length: 12.4 cm. Cortical echogenicity is within normal limits and there is no hydronephrosis. There is a simple appearing midpole cyst up to 37 mm diameter (image 27). Possible small nearby 9-10 mm simple cyst (image 29)  Bladder:  Appears normal for degree of bladder distention.  IMPRESSION: Negative sonographic appearance of the kidneys and bladder.   Electronically Signed   By: Odessa Fleming M.D.   On: 09/20/2014 12:30    ASSESSMENT AND PLAN Principal Problem:   Acute systolic heart failure Active Problems:   Essential hypertension   Obesity (BMI 30-39.9)   CKD (chronic kidney disease) stage 2, GFR 60-89 ml/min   Elevated troponin   AKI (acute kidney injury)   Noncompliance   Hyperlipidemia   Hypokalemia   Essential hypertension, malignant   Renal failure (ARF), acute on  chronic   Hypertensive emergency    Continue to hold ACEI. Noted K of 3.2 today, supplement given. He was switched to PO lasix today. Hold off from spironolactone. Discontinue Bidil, pt can not afford it. Start Imdur 60 BID and hydalazine  TID. Continue Norvasc  qd, ASA , and zocor   and coreg  BID. BP is relatively stable. Continue to monitor today. Renal US - negative sonographic appearance of the kidneys and bladder. Post hospital f/u appointment has been made. Reevaluate LVEF by echo in 3-6 months.   Signed, Bhagat,Bhavinkumar PA-C Pager [phone]  I have seen and examined the patient along with Bhagat,Bhavinkumar PA-C.  I have reviewed the chart, notes and new data.  I agree with PA's note.  Key new complaints: eager to leave, breathing well Key examination  changes: no rales or edema, softer S3 Key new findings / data: creatinine 2.2  PLAN:  Malignant HT with heart failure and CKD stage 2-3  BP closer to goal. Clinically probably euvolemic. Increase hydralazine and nitrates - he prefers generics due to cost. Will plan to transition from amlodipine to ACEi as an outpatient after renal function returns to baseline. Possibly spironolactone also. Office visit 2 weeks post DC. Echo in 3 months and the f/u to discuss long term plans.  Thurmon Fair, MD, [facility] [phone] 09/20/2014, 3:35 PM

## 2014-09-20 NOTE — ED Provider Notes (Signed)
CSN: [id number]     Arrival date & time 09/18/14  4098 History   First MD Initiated Contact with Patient 09/18/14 0802     Chief Complaint  Patient presents with  . Cough     (Consider location/radiation/quality/duration/timing/severity/associated sxs/prior Treatment) HPI Patient presents to the emergency department with cough, exertional dyspnea and tachycardia.  The patient states he does have hypertension which she does not take medications for the patient states that nothing seems to make his condition, better or worse other than exertion.  The patient states that he had had some productive cough.  He had been experiencing chest pain 1 week ago, but that is not been ongoing.  He is a smoker, about a half a pack to one pack a day.  Patient denies headache, blurred vision, weakness, dizziness, back pain, neck pain, fever, runny nose, sore throat, abdominal pain, nausea, vomiting, diarrhea, abdominal pain, rash, or syncope.  He should states he did not take any medications for his symptoms. Past Medical History  Diagnosis Date  . Hypertension    History reviewed. No pertinent past surgical history. No family history on file. History  Substance Use Topics  . Smoking status: Former Smoker -- 0.50 packs/day    Types: Cigarettes    Quit date: 05/20/2014  . Smokeless tobacco: Not on file  . Alcohol Use: No    Review of Systems All other systems negative except as documented in the HPI. All pertinent positives and negatives as reviewed in the HPI.  Allergies  Review of patient's allergies indicates no known allergies.  Home Medications   Prior to Admission medications   Medication Sig Start Date End Date Taking? Authorizing Provider  amLODipine (NORVASC) 5 MG tablet Take 1 tablet (5 mg total) by mouth daily. 11/08/13  Yes Linna Hoff, MD  aspirin 81 MG tablet Take 81 mg by mouth daily.   Yes Historical Provider, MD  losartan-hydrochlorothiazide (HYZAAR) 50-12.5 MG per tablet Take 1  tablet by mouth daily. 05/01/14  Yes Hayden Rasmussen, NP  simvastatin (ZOCOR) 20 MG tablet Take 20 mg by mouth daily.   Yes Historical Provider, MD   BP 126/79 mmHg  Pulse 98  Temp(Src) 98.4 F (36.9 C) (Oral)  Resp 16  Ht  (1.803 m)  Wt 276 lb 12.8 oz (125.556 kg)  BMI 38.62 kg/m2  SpO2 98% Physical Exam  Constitutional: He is oriented to person, place, and time. He appears well-developed and well-nourished. No distress.  HENT:  Head: Normocephalic and atraumatic.  Mouth/Throat: Oropharynx is clear and moist.  Eyes: Pupils are equal, round, and reactive to light.  Neck: Normal range of motion. Neck supple.  Cardiovascular: Regular rhythm and normal heart sounds.  Tachycardia present.  Exam reveals no gallop and no friction rub.   No murmur heard. Pulmonary/Chest: Effort normal and breath sounds normal. No respiratory distress. He has no wheezes.  Abdominal: Soft. Bowel sounds are normal. He exhibits no distension. There is no tenderness.  Musculoskeletal: He exhibits edema.  Trace edema bilaterally  Neurological: He is alert and oriented to person, place, and time. No cranial nerve deficit. He exhibits normal muscle tone. Coordination normal.  Skin: Skin is warm and dry. No rash noted. No erythema.  Psychiatric: He has a normal mood and affect. His behavior is normal.  Nursing note reviewed.   ED Course  Procedures (including critical care time) Labs Review Labs Reviewed  D-DIMER, QUANTITATIVE - Abnormal; Notable for the following:    D-Dimer, Quant 1.71 (*)  All other components within normal limits  BASIC METABOLIC PANEL - Abnormal; Notable for the following:    Potassium 3.0 (*)    CO2 21 (*)    Glucose, Bld 108 (*)    Creatinine, Ser 1.75 (*)    Calcium 8.8 (*)    GFR calc non Af Amer 51 (*)    GFR calc Af Amer 59 (*)    All other components within normal limits  TROPONIN I - Abnormal; Notable for the following:    Troponin I 0.54 (*)    All other components  within normal limits  BRAIN NATRIURETIC PEPTIDE - Abnormal; Notable for the following:    B Natriuretic Peptide 391.7 (*)    All other components within normal limits  BASIC METABOLIC PANEL - Abnormal; Notable for the following:    Glucose, Bld 103 (*)    Creatinine, Ser 2.05 (*)    GFR calc non Af Amer 42 (*)    GFR calc Af Amer 49 (*)    All other components within normal limits  TROPONIN I - Abnormal; Notable for the following:    Troponin I 0.56 (*)    All other components within normal limits  TROPONIN I - Abnormal; Notable for the following:    Troponin I 0.46 (*)    All other components within normal limits  TROPONIN I - Abnormal; Notable for the following:    Troponin I 0.40 (*)    All other components within normal limits  GLUCOSE, CAPILLARY - Abnormal; Notable for the following:    Glucose-Capillary 122 (*)    All other components within normal limits  BASIC METABOLIC PANEL - Abnormal; Notable for the following:    Potassium 3.2 (*)    Glucose, Bld 101 (*)    BUN 28 (*)    Creatinine, Ser 2.20 (*)    Calcium 8.7 (*)    GFR calc non Af Amer 39 (*)    GFR calc Af Amer 45 (*)    All other components within normal limits  CBC WITH DIFFERENTIAL/PLATELET  TSH    Imaging Review US Renal  09/20/2014   CLINICAL DATA:  29 year old male with chronic kidney disease, hypertension. Initial encounter.  EXAM: RENAL / URINARY TRACT ULTRASOUND COMPLETE  COMPARISON:  Chest CTA 09/18/2014.  FINDINGS: Right Kidney:  Length: 12.7 cm. Echogenicity within normal limits. No mass or hydronephrosis visualized.  Left Kidney:  Length: 12.4 cm. Cortical echogenicity is within normal limits and there is no hydronephrosis. There is a simple appearing midpole cyst up to 37 mm diameter (image 27). Possible small nearby 9-10 mm simple cyst (image 29)  Bladder:  Appears normal for degree of bladder distention.  IMPRESSION: Negative sonographic appearance of the kidneys and bladder.   Electronically Signed    By: Odessa Fleming M.D.   On: 09/20/2014 12:30     EKG Interpretation   Date/Time:  Monday Sep 18 2014 10:48:37 EDT Ventricular Rate:  112 PR Interval:  138 QRS Duration: 108 QT Interval:  371 QTC Calculation: 506 R Axis:   88 Text Interpretation:  Sinus tachycardia Biatrial enlargement Left  ventricular hypertrophy Prolonged QT interval nonspecific T waves  unchanged from earlier in the day Confirmed by GOLDSTON  MD, SCOTT ([id number])  on 09/18/2014 12:44:58 PM      MDM   Final diagnoses:  Shortness of breath  Elevated troponin   Patient is advised that he has enlarged heart vascular congestion and elevated troponin and will need to be  admitted to the hospital.  I spoke with cardiology and the [facility] will admit him.   Charlestine Night, PA-C 09/20/14 1620  Pricilla Loveless, MD 10/01/14 [phone]

## 2014-09-20 NOTE — Clinical Documentation Improvement (Signed)
Current notes reflect "acute combined systolic and diastolic CHF / cardiomyopathy - likely nonischemia; secondary to prolonged poorly controlled severe hypertension." Also states patient with CKD stage 2.  Please identify any additional clinical conditions associated with the patient's hypertension, CHF, and CKD and document in your progress note and carry over to the discharge summary.  Possible Clinical Conditions: -Hypertensive heart and chronic kidney disease -Hypertensive heart disease -Other condition (please specify) -Unable to determine  Thank you, Doy Mince, RN [phone] Clinical Documentation Specialist

## 2014-09-21 LAB — BASIC METABOLIC PANEL
Anion gap: 7 (ref 5–15)
BUN: 26 mg/dL — ABNORMAL HIGH (ref 6–20)
CALCIUM: 8.6 mg/dL — AB (ref 8.9–10.3)
CO2: 25 mmol/L (ref 22–32)
CREATININE: 2.04 mg/dL — AB (ref 0.61–1.24)
Chloride: 104 mmol/L (ref 101–111)
GFR calc Af Amer: 49 mL/min — ABNORMAL LOW (ref >60)
GFR, EST NON AFRICAN AMERICAN: 42 mL/min — AB (ref >60)
GLUCOSE: 102 mg/dL — AB (ref 70–99)
Potassium: 3.2 mmol/L — ABNORMAL LOW (ref 3.5–5.1)
Sodium: 136 mmol/L (ref 135–145)

## 2014-09-21 MED ORDER — SIMVASTATIN 20 MG PO TABS: 20.0000 mg | ORAL_TABLET | Freq: Every day | ORAL | Status: DC

## 2014-09-21 MED ORDER — FUROSEMIDE 40 MG PO TABS: 40.0000 mg | ORAL_TABLET | Freq: Every day | ORAL | Status: DC

## 2014-09-21 MED ORDER — POTASSIUM CHLORIDE CRYS ER 20 MEQ PO TBCR: 20.0000 meq | EXTENDED_RELEASE_TABLET | Freq: Every day | ORAL | Status: DC

## 2014-09-21 MED ORDER — POTASSIUM CHLORIDE CRYS ER 20 MEQ PO TBCR
30.0000 meq | EXTENDED_RELEASE_TABLET | ORAL | Status: AC
  Administered 2014-09-21 (×2): 30 meq via ORAL
  Filled 2014-09-21 (×2): qty 1

## 2014-09-21 MED ORDER — CARVEDILOL 25 MG PO TABS: 25.0000 mg | ORAL_TABLET | Freq: Two times a day (BID) | ORAL | Status: DC

## 2014-09-21 MED ORDER — ISOSORBIDE MONONITRATE ER 60 MG PO TB24: 60.0000 mg | ORAL_TABLET | Freq: Two times a day (BID) | ORAL | Status: DC

## 2014-09-21 MED ORDER — AMLODIPINE BESYLATE 5 MG PO TABS: 5.0000 mg | ORAL_TABLET | Freq: Every day | ORAL | Status: DC

## 2014-09-21 MED ORDER — HYDRALAZINE HCL 50 MG PO TABS: 50.0000 mg | ORAL_TABLET | Freq: Three times a day (TID) | ORAL | Status: DC

## 2014-09-21 NOTE — Care Management Note (Signed)
Case Management Note  Patient Details  Name: Shawn [name] MRN: [id number] Date of Birth: 03-31-1986  Subjective/Objective:    Pt for dc home today.  Appointment made for hospital follow up at [facility] for May 11 at 2:30pm.  Appt info placed on AVS in Epic.  MATCH letter given for assistance with medications.                Action/Plan: Pt verb understanding of appt info and MATCH letter.  Stressed importance of keeping appt at [facility].  He states that he will.    Expected Discharge Date:  09/21/14               Expected Discharge Plan:  Home/Self Care  In-House Referral:     Discharge planning Services  CM Consult, [facility], [facility], Medication Assistance  Post Acute Care Choice:    Choice offered to:     DME Arranged:    DME Agency:     HH Arranged:    HH Agency:     Status of Service:  Completed, signed off  Medicare Important Message Given:  No Date Medicare IM Given:    Medicare IM give by:    Date Additional Medicare IM Given:    Additional Medicare Important Message give by:     If discussed at Long Length of Stay Meetings, dates discussed:    Additional Comments:  Glennon Mac, RN 09/21/2014, 2:14 PM [phone]

## 2014-09-21 NOTE — Progress Notes (Signed)
DC IV, DC Tele, DC Home. Discharge instructions and home medications discussed with patient and brother. Patient nd brother denied any questions or concerns at this time. Patient leaving unit via wheelchair and appears in no acute distress.

## 2014-09-21 NOTE — Discharge Instructions (Signed)
Hypertension Hypertension, commonly called high blood pressure, is when the force of blood pumping through your arteries is too strong. Your arteries are the blood vessels that carry blood from your heart throughout your body. A blood pressure reading consists of a higher number over a lower number, such as 110/72. The higher number (systolic) is the pressure inside your arteries when your heart pumps. The lower number (diastolic) is the pressure inside your arteries when your heart relaxes. Ideally you want your blood pressure below 120/80. Hypertension forces your heart to work harder to pump blood. Your arteries may become narrow or stiff. Having hypertension puts you at risk for heart disease, stroke, and other problems.  RISK FACTORS Some risk factors for high blood pressure are controllable. Others are not.  Risk factors you cannot control include:   Race. You may be at higher risk if you are African American.  Age. Risk increases with age.  Gender. Men are at higher risk than women before age 45 years. After age 65, women are at higher risk than men. Risk factors you can control include:  Not getting enough exercise or physical activity.  Being overweight.  Getting too much fat, sugar, calories, or salt in your diet.  Drinking too much alcohol. SIGNS AND SYMPTOMS Hypertension does not usually cause signs or symptoms. Extremely high blood pressure (hypertensive crisis) may cause headache, anxiety, shortness of breath, and nosebleed. DIAGNOSIS  To check if you have hypertension, your health care provider will measure your blood pressure while you are seated, with your arm held at the level of your heart. It should be measured at least twice using the same arm. Certain conditions can cause a difference in blood pressure between your right and left arms. A blood pressure reading that is higher than normal on one occasion does not mean that you need treatment. If one blood pressure reading  is high, ask your health care provider about having it checked again. TREATMENT  Treating high blood pressure includes making lifestyle changes and possibly taking medicine. Living a healthy lifestyle can help lower high blood pressure. You may need to change some of your habits. Lifestyle changes may include:  Following the DASH diet. This diet is high in fruits, vegetables, and whole grains. It is low in salt, red meat, and added sugars.  Getting at least 2 hours of brisk physical activity every week.  Losing weight if necessary.  Not smoking.  Limiting alcoholic beverages.  Learning ways to reduce stress. If lifestyle changes are not enough to get your blood pressure under control, your health care provider may prescribe medicine. You may need to take more than one. Work closely with your health care provider to understand the risks and benefits. HOME CARE INSTRUCTIONS  Have your blood pressure rechecked as directed by your health care provider.   Take medicines only as directed by your health care provider. Follow the directions carefully. Blood pressure medicines must be taken as prescribed. The medicine does not work as well when you skip doses. Skipping doses also puts you at risk for problems.   Do not smoke.   Monitor your blood pressure at home as directed by your health care provider. SEEK MEDICAL CARE IF:   You think you are having a reaction to medicines taken.  You have recurrent headaches or feel dizzy.  You have swelling in your ankles.  You have trouble with your vision. SEEK IMMEDIATE MEDICAL CARE IF:  You develop a severe headache or confusion.    You have unusual weakness, numbness, or feel faint.  You have severe chest or abdominal pain.  You vomit repeatedly.  You have trouble breathing. MAKE SURE YOU:   Understand these instructions.  Will watch your condition.  Will get help right away if you are not doing well or get worse. Document  Released: 05/05/2005 Document Revised: 09/19/2013 Document Reviewed: 02/25/2013 [employer] Patient Information 2015 [employer]. This information is not intended to replace advice given to you by your health care provider. Make sure you discuss any questions you have with your health care provider. Heart Failure Heart failure is a condition in which the heart has trouble pumping blood. This means your heart does not pump blood efficiently for your body to work well. In some cases of heart failure, fluid may back up into your lungs or you may have swelling (edema) in your lower legs. Heart failure is usually a long-term (chronic) condition. It is important for you to take good care of yourself and follow your health care provider's treatment plan. CAUSES  Some health conditions can cause heart failure. Those health conditions include:  High blood pressure (hypertension). Hypertension causes the heart muscle to work harder than normal. When pressure in the blood vessels is high, the heart needs to pump (contract) with more force in order to circulate blood throughout the body. High blood pressure eventually causes the heart to become stiff and weak.  Coronary artery disease (CAD). CAD is the buildup of cholesterol and fat (plaque) in the arteries of the heart. The blockage in the arteries deprives the heart muscle of oxygen and blood. This can cause chest pain and may lead to a heart attack. High blood pressure can also contribute to CAD.  Heart attack (myocardial infarction). A heart attack occurs when one or more arteries in the heart become blocked. The loss of oxygen damages the muscle tissue of the heart. When this happens, part of the heart muscle dies. The injured tissue does not contract as well and weakens the heart's ability to pump blood.  Abnormal heart valves. When the heart valves do not open and close properly, it can cause heart failure. This makes the heart muscle pump harder to keep  the blood flowing.  Heart muscle disease (cardiomyopathy or myocarditis). Heart muscle disease is damage to the heart muscle from a variety of causes. These can include drug or alcohol abuse, infections, or unknown reasons. These can increase the risk of heart failure.  Lung disease. Lung disease makes the heart work harder because the lungs do not work properly. This can cause a strain on the heart, leading it to fail.  Diabetes. Diabetes increases the risk of heart failure. High blood sugar contributes to high fat (lipid) levels in the blood. Diabetes can also cause slow damage to tiny blood vessels that carry important nutrients to the heart muscle. When the heart does not get enough oxygen and food, it can cause the heart to become weak and stiff. This leads to a heart that does not contract efficiently.  Other conditions can contribute to heart failure. These include abnormal heart rhythms, thyroid problems, and low blood counts (anemia). Certain unhealthy behaviors can increase the risk of heart failure, including:  Being overweight.  Smoking or chewing tobacco.  Eating foods high in fat and cholesterol.  Abusing illicit drugs or alcohol.  Lacking physical activity. SYMPTOMS  Heart failure symptoms may vary and can be hard to detect. Symptoms may include:  Shortness of breath with activity,   such as climbing stairs.  Persistent cough.  Swelling of the feet, ankles, legs, or abdomen.  Unexplained weight gain.  Difficulty breathing when lying flat (orthopnea).  Waking from sleep because of the need to sit up and get more air.  Rapid heartbeat.  Fatigue and loss of energy.  Feeling light-headed, dizzy, or close to fainting.  Loss of appetite.  Nausea.  Increased urination during the night (nocturia). DIAGNOSIS  A diagnosis of heart failure is based on your history, symptoms, physical examination, and diagnostic tests. Diagnostic tests for heart failure may  include:  Echocardiography.  Electrocardiography.  Chest X-ray.  Blood tests.  Exercise stress test.  Cardiac angiography.  Radionuclide scans. TREATMENT  Treatment is aimed at managing the symptoms of heart failure. Medicines, behavioral changes, or surgical intervention may be necessary to treat heart failure.  Medicines to help treat heart failure may include:  Angiotensin-converting enzyme (ACE) inhibitors. This type of medicine blocks the effects of a blood protein called angiotensin-converting enzyme. ACE inhibitors relax (dilate) the blood vessels and help lower blood pressure.  Angiotensin receptor blockers (ARBs). This type of medicine blocks the actions of a blood protein called angiotensin. Angiotensin receptor blockers dilate the blood vessels and help lower blood pressure.  Water pills (diuretics). Diuretics cause the kidneys to remove salt and water from the blood. The extra fluid is removed through urination. This loss of extra fluid lowers the volume of blood the heart pumps.  Beta blockers. These prevent the heart from beating too fast and improve heart muscle strength.  Digitalis. This increases the force of the heartbeat.  Healthy behavior changes include:  Obtaining and maintaining a healthy weight.  Stopping smoking or chewing tobacco.  Eating heart-healthy foods.  Limiting or avoiding alcohol.  Stopping illicit drug use.  Physical activity as directed by your health care provider.  Surgical treatment for heart failure may include:  A procedure to open blocked arteries, repair damaged heart valves, or remove damaged heart muscle tissue.  A pacemaker to improve heart muscle function and control certain abnormal heart rhythms.  An internal cardioverter defibrillator to treat certain serious abnormal heart rhythms.  A left ventricular assist device (LVAD) to assist the pumping ability of the heart. HOME CARE INSTRUCTIONS   Take medicines only  as directed by your health care provider. Medicines are important in reducing the workload of your heart, slowing the progression of heart failure, and improving your symptoms.  Do not stop taking your medicine unless directed by your health care provider.  Do not skip any dose of medicine.  Refill your prescriptions before you run out of medicine. Your medicines are needed every day.  Engage in moderate physical activity if directed by your health care provider. Moderate physical activity can benefit some people. The elderly and people with severe heart failure should consult with a health care provider for physical activity recommendations.  Eat heart-healthy foods. Food choices should be free of trans fat and low in saturated fat, cholesterol, and salt (sodium). Healthy choices include fresh or frozen fruits and vegetables, fish, lean meats, legumes, fat-free or low-fat dairy products, and whole grain or high fiber foods. Talk to a dietitian to learn more about heart-healthy foods.  Limit sodium if directed by your health care provider. Sodium restriction may reduce symptoms of heart failure in some people. Talk to a dietitian to learn more about heart-healthy seasonings.  Use healthy cooking methods. Healthy cooking methods include roasting, grilling, broiling, baking, poaching, steaming, or stir-frying. Talk   to a dietitian to learn more about healthy cooking methods.  Limit fluids if directed by your health care provider. Fluid restriction may reduce symptoms of heart failure in some people.  Weigh yourself every day. Daily weights are important in the early recognition of excess fluid. You should weigh yourself every morning after you urinate and before you eat breakfast. Wear the same amount of clothing each time you weigh yourself. Record your daily weight. Provide your health care provider with your weight record.  Monitor and record your blood pressure if directed by your health care  provider.  Check your pulse if directed by your health care provider.  Lose weight if directed by your health care provider. Weight loss may reduce symptoms of heart failure in some people.  Stop smoking or chewing tobacco. Nicotine makes your heart work harder by causing your blood vessels to constrict. Do not use nicotine gum or patches before talking to your health care provider.  Keep all follow-up visits as directed by your health care provider. This is important.  Limit alcohol intake to no more than 1 drink per day for nonpregnant women and 2 drinks per day for men. One drink equals 12 ounces of beer, 5 ounces of wine, or 1 ounces of hard liquor. Drinking more than that is harmful to your heart. Tell your health care provider if you drink alcohol several times a week. Talk with your health care provider about whether alcohol is safe for you. If your heart has already been damaged by alcohol or you have severe heart failure, drinking alcohol should be stopped completely.  Stop illicit drug use.  Stay up-to-date with immunizations. It is especially important to prevent respiratory infections through current pneumococcal and influenza immunizations.  Manage other health conditions such as hypertension, diabetes, thyroid disease, or abnormal heart rhythms as directed by your health care provider.  Learn to manage stress.  Plan rest periods when fatigued.  Learn strategies to manage high temperatures. If the weather is extremely hot:  Avoid vigorous physical activity.  Use air conditioning or fans or seek a cooler location.  Avoid caffeine and alcohol.  Wear loose-fitting, lightweight, and light-colored clothing.  Learn strategies to manage cold temperatures. If the weather is extremely cold:  Avoid vigorous physical activity.  Layer clothes.  Wear mittens or gloves, a hat, and a scarf when going outside.  Avoid alcohol.  Obtain ongoing education and support as  needed.  Participate in or seek rehabilitation as needed to maintain or improve independence and quality of life. SEEK MEDICAL CARE IF:   Your weight increases by 03 lb/1.4 kg in 1 day or 05 lb/2.3 kg in a week.  You have increasing shortness of breath that is unusual for you.  You are unable to participate in your usual physical activities.  You tire easily.  You cough more than normal, especially with physical activity.  You have any or more swelling in areas such as your hands, feet, ankles, or abdomen.  You are unable to sleep because it is hard to breathe.  You feel like your heart is beating fast (palpitations).  You become dizzy or light-headed upon standing up. SEEK IMMEDIATE MEDICAL CARE IF:   You have difficulty breathing.  There is a change in mental status such as decreased alertness or difficulty with concentration.  You have a pain or discomfort in your chest.  You have an episode of fainting (syncope). MAKE SURE YOU:   Understand these instructions.  Will watch   your condition.  Will get help right away if you are not doing well or get worse. Document Released: 05/05/2005 Document Revised: 09/19/2013 Document Reviewed: 06/04/2012 [employer] Patient Information 2015 [employer]. This information is not intended to replace advice given to you by your health care provider. Make sure you discuss any questions you have with your health care provider.  

## 2014-09-21 NOTE — Discharge Summary (Signed)
Physician Discharge Summary  WILBUR OAKLAND ZOX:[id number] DOB: Sep 01, 1985 DOA: 09/18/2014  PCP: No PCP Per Patient  Admit date: 09/18/2014 Discharge date: 09/21/2014  Time spent: Greater than 30 minutes  Recommendations for Outpatient Follow-up:  1. Huey Bienenstock, Cardiology PA-C on 10/06/14 at 2:30 PM 2. Dr. Thurmon Fair, Cardiology on 11/03/14 at 2:45 PM 3. [facility] on 09/27/14 with repeat labs [facility])  Discharge Diagnoses:  Principal Problem:   Acute systolic heart failure Active Problems:   Essential hypertension   Obesity (BMI 30-39.9)   CKD (chronic kidney disease) stage 2, GFR 60-89 ml/min   Elevated troponin   AKI (acute kidney injury)   Noncompliance   Hyperlipidemia   Hypokalemia   Essential hypertension, malignant   Renal failure (ARF), acute on chronic   Hypertensive emergency   Malignant hypertension, heart failure & chronic kidney dis stage II   Renal failure, acute on chronic   Discharge Condition: Improved & Stable  Diet recommendation: Heart healthy diet.  Filed Weights   09/19/14 0519 09/20/14 0651 09/21/14 0551  Weight: 125.057 kg (275 lb 11.2 oz) 125.556 kg (276 lb 12.8 oz) 126.735 kg (279 lb 6.4 oz)    History of present illness:  29 y.o. male past medical history of hypertension, diabetes and obesity who was started on blood pressure and cholesterol medications last year by urgent care, but stopped taking them. He had noted for the past week or 2 that he had been having increased dyspnea on exertion. No outright shortness of breath or chest pain. He became concerned and came into the emergency room for further evaluation. In the emergency room he was noted to be mildly tachycardic with a blood pressure of 196/118. Labs were done patient was noted to have a creatinine of 1.75, previously at 1.5 and a BNP of 390. Patient suspected to have acute CHF and started on IV Lasix. Hospitalists were called for further evaluation.  D-dimer came back elevated at 1.7 followed by CTA chest-negative for PE. Cardiologists were consulted.  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 If creatinine stabilizes, resume ACEI -possibly outpatient. - Hold off on Aldactone. - Continue BiDil-cannot afford and hence switched to hydralazine and Imdur. May consider discontinuing amlodipine as outpatient. - As per cardiology, 2-D echo: LVEF 25-30 percent and restrictive filling noted - Improved. - TSH normal at 2.057.  Hypertensive emergency - Secondary to noncompliance with medications. Counseled extensively regarding this in the presence of his mother at bedside on multiple locations. He verbalized understanding. - Continue amlodipine, carvedilol, Imdur and hydralazine. Blood pressure control improved. - Holding ACEI secondary to worsening creatinine-may consider outpatient. May also consider stopping amlodipine during outpatient follow-up.  Acute on stage II chronic kidney disease - Last creatinine 05/01/14:1.5 - Slight worsening of creatinine to 2.05, possibly from CT contrast, diuretics and malignant hypertension - Hold ACEI - Creatinine at 2.2 - Renal ultrasound without obstruction - Expect to improve. Creatinine starting to improve. Close outpatient follow-up with repeat BMP in a few days. Patient advised to avoid NSAIDs.  Elevated troponin - Likely from demand ischemia related to uncontrolled hypertension, acute CHF and acute on chronic kidney disease - Flat trend - 2-D echo results as above - Management as indicated  above - Cardiology continued to follow patient. Discussed with cardiology service today and they cleared  him for discharge and have arranged for outpatient follow-up.  Hyperlipidemia - statins  Hypokalemia - Replaced prior to discharge. Follow BMP outpatient.  Obesity (BMI 30-39.9) - Patient meets criteria with BMI greater than 30  - Counseled regarding diet, and weight loss.  Lymphadenopathy on CT a chest  - Unclear significance  - As per radiologist's recommendation, follow up CT chest in 8-10 weeks   Consultants:  Cardiology  Procedures:  2-D echo 09/19/14: Study Conclusions  - Left ventricle: The cavity size was severely dilated. Wall thickness was increased in a pattern of moderate LVH. Systolic function was moderately reduced. The estimated ejection fraction was in the range of 35% to 40%. Diffuse hypokinesis. Akinesis of the basalinferior myocardium. - Aortic valve: There was trivial regurgitation. - Mitral valve: There was mild regurgitation. - Pericardium, extracardiac: A small pericardial effusion was identified circumferential to the heart.  Bilateral lower extremity venous Dopplers 09/19/14: Summary: No evidence of deep vein or superficial thrombosis involving the right lower extremity and left lower extremity.    Discharge Exam:  Complaints: Patient denied complaints. No dyspnea, chest pain or leg swelling.  Filed Vitals:   09/20/14 1708 09/20/14 2012 09/21/14 0551 09/21/14 1124  BP: 121/71 120/63 133/76 138/86  Pulse: 101 93 96 101  Temp:  98.4 F (36.9 C) 97 F (36.1 C)   TempSrc:  Oral Oral   Resp:  18 18   Height:      Weight:   126.735 kg (279 lb 6.4 oz)   SpO2:  100% 99%     General exam: Pleasant young male ambulating comfortably in the room. Respiratory system: Clear. No increased work of breathing. Cardiovascular system: S1 & S2 heard, RRR. No JVD, murmurs, gallops, clicks or pedal edema.  Gastrointestinal system: Abdomen is nondistended, soft and nontender. Normal bowel sounds heard. Central nervous system: Alert and oriented.  No focal neurological deficits. Extremities: Symmetric 5 x 5 power.  Discharge Instructions      Discharge Instructions    (HEART FAILURE PATIENTS) Call MD:  Anytime you have any of the following symptoms: 1) 3 pound weight gain in 24 hours or 5 pounds in 1 week 2) shortness of breath, with or without a dry hacking cough 3) swelling in the hands, feet or stomach 4) if you have to sleep on extra pillows at night in order to breathe.    Complete by:  As directed      Activity as tolerated - No restrictions    Complete by:  As directed      Call MD for:  difficulty breathing, headache or visual disturbances    Complete by:  As directed      Call MD for:  extreme fatigue    Complete by:  As directed      Call MD for:  persistant dizziness or light-headedness    Complete by:  As directed      Diet - low sodium heart healthy    Complete by:  As directed             Medication List    STOP taking these medications        losartan-hydrochlorothiazide 50-12.5 MG per tablet  Commonly known as:  HYZAAR      TAKE these medications        amLODipine 5 MG tablet  Commonly known as:  NORVASC  Take 1 tablet (5  mg total) by mouth daily.     aspirin 81 MG tablet  Take 81 mg by mouth daily.     carvedilol 25 MG tablet  Commonly known as:  COREG  Take 1 tablet (25 mg total) by mouth 2 (two) times daily with a meal.     furosemide 40 MG tablet  Commonly known as:  LASIX  Take 1 tablet (40 mg total) by mouth daily.     hydrALAZINE 50 MG tablet  Commonly known as:  APRESOLINE  Take 1 tablet (50 mg total) by mouth 3 (three) times daily.     isosorbide mononitrate 60 MG 24 hr tablet  Commonly known as:  IMDUR  Take 1 tablet (60 mg total) by mouth 2 (two) times daily.     potassium chloride SA 20 MEQ tablet  Commonly known as:  K-DUR,KLOR-CON  Take 1 tablet (20 mEq total) by mouth daily.     simvastatin 20 MG tablet  Commonly known as:  ZOCOR  Take 1 tablet (20 mg total) by mouth  daily.       Follow-up Information    Follow up with HAGER, BRYAN, PA-C On 10/06/2014.   Specialty:  Physician Assistant   Why:  @ 2:30 post hospital   Contact information:   [address] [place], [place] [address] [phone]       Follow up with Thurmon Fair, MD On 11/03/2014.   Specialty:  Cardiology   Why:  2:45 for new onset systolic CHF and uncontrolled HTN.   Contact information:   [address] [place] [address] [phone]       Follow up with [facility]     On 09/27/2014.   Why:  2:30pm    please bring copy of discharge instructions, all medications you are taking, and $20 copay, if you are able.  To be seen with repeat labs (BMP).   Contact information:   [address] [place] [address] [phone]       The results of significant diagnostics from this hospitalization (including imaging, microbiology, ancillary and laboratory) are listed below for reference.    Significant Diagnostic Studies: Dg Chest 2 View  09/18/2014   CLINICAL DATA:  Cough, short of breath, wheezing, smoking.  EXAM: CHEST  2 VIEW  COMPARISON:  None.  FINDINGS: Mild cardiomegaly. Mild central vascular congestion. No pneumothorax. No pleural effusion. Bronchitic changes are likely chronic. Intact thoracic spine.  IMPRESSION: Cardiomegaly and mild central vascular congestion.   Electronically Signed   By: Jolaine Click M.D.   On: 09/18/2014 09:00   Ct Angio Chest Pe W/cm &/or Wo Cm  09/18/2014   CLINICAL DATA:  Shortness of Breath for 2 weeks. Chest pain for 1 week  EXAM: CT ANGIOGRAPHY CHEST WITH CONTRAST  TECHNIQUE: Multidetector CT imaging of the chest was performed using the standard protocol during bolus administration of intravenous contrast. Multiplanar CT image reconstructions and MIPs were obtained to evaluate the vascular anatomy.  CONTRAST:  OMNIPAQUE IOHEXOL 350 MG/ML SOLN  COMPARISON:  Chest  radiograph Sep 18, 2014  FINDINGS: There is no demonstrable pulmonary embolus. There is no thoracic aortic aneurysm or dissection.  There are rather minimal pleural effusions bilaterally. There is hazy ground-glass type opacity in both lower lobes as well as to a much lesser degree in the right upper and right middle lobes. There is no appreciable volume loss.  There is cardiomegaly. There is left ventricular hypertrophy.  The pericardium is not thickened.  Thyroid appears normal.  There are several prominent lymph nodes throughout the mediastinum. The largest individual lymph node is in the right hilum measuring 2.4 x 1.8 cm. There is a lymph node in the left hilum measuring 1.6 x 1.5 cm. There are prominent lymph nodes adjacent to the aortic arch and in the sub- carinal region as well.  In the visualized upper abdomen, no lesions are identified. There are no blastic or lytic bone lesions.  Review of the MIP images confirms the above findings.  IMPRESSION: No demonstrable pulmonary embolus. Cardiomegaly with areas of patchy ground-glass opacity which likely represent mild edema and rather minimal pleural effusions. This appearance raises concern for a degree of underlying congestive heart failure. There is left ventricular hypertrophy. There are several prominent lymph nodes of uncertain etiology. Given these prominent lymph nodes, a followup chest CT in 8-10 weeks may be reasonable.   Electronically Signed   By: Bretta Bang III M.D.   On: 09/18/2014 11:41   US Renal  09/20/2014   CLINICAL DATA:  29 year old male with chronic kidney disease, hypertension. Initial encounter.  EXAM: RENAL / URINARY TRACT ULTRASOUND COMPLETE  COMPARISON:  Chest CTA 09/18/2014.  FINDINGS: Right Kidney:  Length: 12.7 cm. Echogenicity within normal limits. No mass or hydronephrosis visualized.  Left Kidney:  Length: 12.4 cm. Cortical echogenicity is within normal limits and there is no hydronephrosis. There is a simple appearing  midpole cyst up to 37 mm diameter (image 27). Possible small nearby 9-10 mm simple cyst (image 29)  Bladder:  Appears normal for degree of bladder distention.  IMPRESSION: Negative sonographic appearance of the kidneys and bladder.   Electronically Signed   By: Odessa Fleming M.D.   On: 09/20/2014 12:30    Microbiology: No results found for this or any previous visit (from the past 240 hour(s)).   Labs: Basic Metabolic Panel:  Recent Labs Lab 09/18/14 0900 09/19/14 0534 09/20/14 0316 09/21/14 0256  NA 137 139 137 136  K 3.0* 3.5 3.2* 3.2*  CL 104 103 102 104  CO2 21* GLUCOSE 108* 103* 101* 102*  BUN 18 20 28* 26*  CREATININE 1.75* 2.05* 2.20* 2.04*  CALCIUM 8.8* 9.0 8.7* 8.6*   Liver Function Tests: No results for input(s): AST, ALT, ALKPHOS, BILITOT, PROT, ALBUMIN in the last 168 hours. No results for input(s): LIPASE, AMYLASE in the last 168 hours. No results for input(s): AMMONIA in the last 168 hours. CBC:  Recent Labs Lab 09/18/14 0900  WBC 9.0  NEUTROABS 6.0  HGB 14.0  HCT 39.8  MCV 81.9  PLT 316   Cardiac Enzymes:  Recent Labs Lab 09/18/14 0900 09/18/14 1830 09/18/14 2346 09/19/14 0534  TROPONINI 0.54* 0.56* 0.46* 0.40*   BNP: BNP (last 3 results)  Recent Labs  09/18/14 0900  BNP 391.7*    ProBNP (last 3 results) No results for input(s): PROBNP in the last 8760 hours.  CBG:  Recent Labs Lab 09/18/14 2004  GLUCAP 122*       Signed:  Marcellus Scott, MD, FACP, FHM. [facility] Pager [phone]  If 7PM-7AM, please contact night-coverage [email or web address] Password [email or web address] 09/21/2014, 12:16 PM

## 2014-09-27 ENCOUNTER — Ambulatory Visit: Payer: Self-pay | Attending: Family Medicine | Admitting: Family Medicine

## 2014-09-27 ENCOUNTER — Encounter: Payer: Self-pay | Admitting: Family Medicine

## 2014-09-27 VITALS — BP 125/83 | HR 90 | Temp 99.0°F | Resp 18 | Ht 71.0 in | Wt 278.0 lb

## 2014-09-27 DIAGNOSIS — E876 Hypokalemia: Secondary | ICD-10-CM

## 2014-09-27 DIAGNOSIS — I5021 Acute systolic (congestive) heart failure: Secondary | ICD-10-CM

## 2014-09-27 DIAGNOSIS — N179 Acute kidney failure, unspecified: Secondary | ICD-10-CM

## 2014-09-27 DIAGNOSIS — R9389 Abnormal findings on diagnostic imaging of other specified body structures: Secondary | ICD-10-CM | POA: Insufficient documentation

## 2014-09-27 DIAGNOSIS — I10 Essential (primary) hypertension: Secondary | ICD-10-CM

## 2014-09-27 DIAGNOSIS — R938 Abnormal findings on diagnostic imaging of other specified body structures: Secondary | ICD-10-CM

## 2014-09-27 LAB — BASIC METABOLIC PANEL
BUN: 21 mg/dL (ref 6–23)
CHLORIDE: 105 meq/L (ref 96–112)
CO2: 25 mEq/L (ref 19–32)
Calcium: 8.8 mg/dL (ref 8.4–10.5)
Creat: 1.82 mg/dL — ABNORMAL HIGH (ref 0.50–1.35)
GLUCOSE: 92 mg/dL (ref 70–99)
Potassium: 4.5 mEq/L (ref 3.5–5.3)
Sodium: 138 mEq/L (ref 135–145)

## 2014-09-27 MED ORDER — FUROSEMIDE 40 MG PO TABS: 40.0000 mg | ORAL_TABLET | Freq: Every day | ORAL | Status: DC

## 2014-09-27 MED ORDER — CARVEDILOL 25 MG PO TABS: 25.0000 mg | ORAL_TABLET | Freq: Two times a day (BID) | ORAL | Status: DC

## 2014-09-27 MED ORDER — HYDRALAZINE HCL 50 MG PO TABS: 50.0000 mg | ORAL_TABLET | Freq: Three times a day (TID) | ORAL | Status: DC

## 2014-09-27 MED ORDER — AMLODIPINE BESYLATE 5 MG PO TABS: 5.0000 mg | ORAL_TABLET | Freq: Every day | ORAL | Status: DC

## 2014-09-27 MED ORDER — POTASSIUM CHLORIDE CRYS ER 20 MEQ PO TBCR: 20.0000 meq | EXTENDED_RELEASE_TABLET | Freq: Every day | ORAL | Status: DC

## 2014-09-27 MED ORDER — ISOSORBIDE MONONITRATE ER 60 MG PO TB24: 60.0000 mg | ORAL_TABLET | Freq: Two times a day (BID) | ORAL | Status: DC

## 2014-09-27 MED ORDER — SIMVASTATIN 20 MG PO TABS: 20.0000 mg | ORAL_TABLET | Freq: Every day | ORAL | Status: DC

## 2014-09-27 MED ORDER — ASPIRIN 81 MG PO TABS: 81.0000 mg | ORAL_TABLET | Freq: Every day | ORAL | Status: DC

## 2014-09-27 NOTE — Progress Notes (Signed)
Patient hospitalized May 2 - May 6 for CHF and fluid in lungs. Patient reports feeling a lot better, still gets winded with strenuous activity. Patient denies pain at this time. Patient not taking aspirin 81mg , please clarify if patient should be taking this medication.

## 2014-09-27 NOTE — Progress Notes (Signed)
Subjective:    Patient ID: Shawn [name], male    DOB: [birth year], 29 y.o.   MRN: [id number]  HPI  Admit date: 09/18/14 Discharge date: 09/21/14  Wrenley Weatherman had presented to the ED with dyspnea on exertion and was noted to be tachycardic and with an elevated blood pressure of 196/118. He had acknowledged failing to take his antihypertensives which he had received last year  On presentation he had elevated troponins of 0.54, 0.56 and 0.46,  creatinine of 1.75, BNP of 390, elevated d-dimer 1.7 and was started on IV Lasix presumptively for CHF, he had hypokalemia which was replaced; CT angiogram was negative for PE but revealed lymphadenopathy of uncertain origin. His 2-D echo revealed left ventricular ejection fraction of 35-40% with restrictive filling, moderate LVH, diffuse hypokinesis, mild MR, mild aortic regurgitation.  He was commenced on hydralazine, Imdur, ACE inhibitor (which was discontinued due to rising creatinine). Was seen by cardiology and elevated troponin was attributed to demand ischemia from uncontrolled hypertension, acute CHF, acute on chronic kidney disease and he was scheduled for outpatient follow-up with cardiology   Interval History: Has no concerns today; he gets dyspneic with moderate exertion. Denies pedal edema. Has been unable to obtain ASA  Past Medical History  Diagnosis Date  . Hypertension   . CHF (congestive heart failure)     History reviewed. No pertinent past surgical history.  Family History  Problem Relation Age of Onset  . Diabetes Mother   . Hypertension Mother     History   Social History  . Marital Status: Single    Spouse Name: N/A  . Number of Children: N/A  . Years of Education: N/A   Occupational History  . Not on file.   Social History Main Topics  . Smoking status: Former Smoker -- 0.50 packs/day    Types: Cigarettes    Quit date: 05/20/2014  . Smokeless tobacco: Not on file  . Alcohol Use: No  . Drug Use: Yes      Special: Marijuana     Comment: 09/27/14 last used marijuana 2 weeks ago.  Marland Kitchen Sexual Activity: Not on file   Other Topics Concern  . Not on file   Social History Narrative    No Known Allergies  No current outpatient prescriptions on file prior to visit.   No current facility-administered medications on file prior to visit.      Review of Systems     Objective: Filed Vitals:   09/27/14 1423 09/27/14 1438  BP: 125/83   Pulse: 103 90  Temp: 99 F (37.2 C)   TempSrc: Oral   Resp: 18   Height: 5\' 11"  (1.803 m)   Weight: 278 lb (126.1 kg)   SpO2: 98%       Physical Exam  Constitutional: He is oriented to person, place, and time. He appears well-developed and well-nourished.  HENT:  Head: Normocephalic and atraumatic.  Right Ear: External ear normal.  Left Ear: External ear normal.  Eyes: Conjunctivae and EOM are normal. Pupils are equal, round, and reactive to light.  Neck: Normal range of motion. Neck supple. No JVD present. No tracheal deviation present.  Cardiovascular: Normal rate, regular rhythm and normal heart sounds.   No murmur heard. Pulmonary/Chest: Effort normal and breath sounds normal. No respiratory distress. He has no wheezes. He exhibits no tenderness.  Abdominal: Soft. Bowel sounds are normal. He exhibits no mass. There is no tenderness.  Musculoskeletal: Normal range of motion. He exhibits no  edema or tenderness.  Neurological: He is alert and oriented to person, place, and time.  Skin: Skin is warm and dry.  Psychiatric: He has a normal mood and affect.     EXAM: CT ANGIOGRAPHY CHEST WITH CONTRAST  TECHNIQUE: Multidetector CT imaging of the chest was performed using the standard protocol during bolus administration of intravenous contrast. Multiplanar CT image reconstructions and MIPs were obtained to evaluate the vascular anatomy.  CONTRAST: OMNIPAQUE IOHEXOL 350 MG/ML SOLN  COMPARISON: Chest radiograph Sep 18, 2014  FINDINGS: There is no demonstrable pulmonary embolus. There is no thoracic aortic aneurysm or dissection.  There are rather minimal pleural effusions bilaterally. There is hazy ground-glass type opacity in both lower lobes as well as to a much lesser degree in the right upper and right middle lobes. There is no appreciable volume loss.  There is cardiomegaly. There is left ventricular hypertrophy. The pericardium is not thickened.  Thyroid appears normal.  There are several prominent lymph nodes throughout the mediastinum. The largest individual lymph node is in the right hilum measuring 2.4 x 1.8 cm. There is a lymph node in the left hilum measuring 1.6 x 1.5 cm. There are prominent lymph nodes adjacent to the aortic arch and in the sub- carinal region as well.  In the visualized upper abdomen, no lesions are identified. There are no blastic or lytic bone lesions.  Review of the MIP images confirms the above findings.  IMPRESSION: No demonstrable pulmonary embolus. Cardiomegaly with areas of patchy ground-glass opacity which likely represent mild edema and rather minimal pleural effusions. This appearance raises concern for a degree of underlying congestive heart failure. There is left ventricular hypertrophy. There are several prominent lymph nodes of uncertain etiology. Given these prominent lymph nodes, a followup chest CT in 8-10 weeks may be reasonable.   Electronically Signed  By: Bretta Bang III M.D.  On: 09/18/2014 11:41       Assessment & Plan:  29 year old male with uncontrolled hypertension and recently diagnosed congestive heart failure.  Hypertension: Controlled. If renal function is controlled, he will need to be placed on low dose ace inhibitor  Congestive heart failure: Not in acute failure at this time: Advised to keep appointment with cardiology. Restrict daily fluids to 2 L per day, advised him on daily weight checks,  and low-sodium diet.  Acute kidney injury: Basic metabolic panel today to evaluate creatinine and if normal I will place him on an ACE inhibitor.  Hypokalemia: Repeat potassium levels today  Lymphadenopathy on chest CT: Unclear etiology  Disclaimer: This note was dictated with voice recognition software. Similar sounding words can inadvertently be transcribed and this note may contain transcription errors which may not have been corrected upon publication of note. Repeat chest CT recommended in 8-10 weeks.

## 2014-09-27 NOTE — Patient Instructions (Signed)

## 2014-09-28 ENCOUNTER — Other Ambulatory Visit: Payer: Self-pay | Admitting: Family Medicine

## 2014-09-28 ENCOUNTER — Telehealth: Payer: Self-pay

## 2014-09-28 DIAGNOSIS — N183 Chronic kidney disease, stage 3 unspecified: Secondary | ICD-10-CM

## 2014-09-28 NOTE — Telephone Encounter (Signed)
Nurse called patient. Person answered phone, informed nurse patient is not home at this time and offered to take message. Nurse left name, Herbert Seta, and phone number [phone], and requested patient to return call.

## 2014-09-28 NOTE — Telephone Encounter (Signed)
Patient is returning phone call from nurse about results, please f/u with pt.

## 2014-09-28 NOTE — Telephone Encounter (Signed)
-----   Message from Enobong Amao, MD sent at 09/28/2014  9:24 AM EDT ----- Please inform him that his kidney function shows a decline but has improved a little bit; I am referring him to a nephrologist. 

## 2014-09-28 NOTE — Telephone Encounter (Signed)
-----   Message from Jaclyn Shaggy, MD sent at 09/28/2014  9:24 AM EDT ----- Please inform him that his kidney function shows a decline but has improved a little bit; I am referring him to a nephrologist.

## 2014-09-28 NOTE — Progress Notes (Signed)
Chronic kidney disease will refer to nephrology.

## 2014-09-28 NOTE — Telephone Encounter (Signed)
Patient returned call to nurse. Patient verified date of birth. Patient aware of kidney function improved a little but still not good. Patient needed clarification. Nurse advised patient of Stage 3 kidney disease as verbalized by Dr. Venetia Night. Patients asked if kidney failure could be due to mediations. Nurse advised this is not likely as verbalized by Dr. Venetia Night. Patient had kidney disease prior to initiation of medications. Patient advised of nephrology appointment being made and someone will contact him with date and time. Patient instructed to call nurse back with any questions. Patient voices understanding.

## 2014-10-06 ENCOUNTER — Ambulatory Visit (INDEPENDENT_AMBULATORY_CARE_PROVIDER_SITE_OTHER): Payer: Self-pay | Admitting: Physician Assistant

## 2014-10-06 ENCOUNTER — Encounter: Payer: Self-pay | Admitting: Physician Assistant

## 2014-10-06 DIAGNOSIS — I10 Essential (primary) hypertension: Secondary | ICD-10-CM

## 2014-10-06 DIAGNOSIS — I428 Other cardiomyopathies: Secondary | ICD-10-CM

## 2014-10-06 DIAGNOSIS — N183 Chronic kidney disease, stage 3 unspecified: Secondary | ICD-10-CM

## 2014-10-06 DIAGNOSIS — I429 Cardiomyopathy, unspecified: Secondary | ICD-10-CM

## 2014-10-06 MED ORDER — FUROSEMIDE 40 MG PO TABS: 20.0000 mg | ORAL_TABLET | Freq: Every day | ORAL | Status: DC

## 2014-10-06 MED ORDER — POTASSIUM CHLORIDE CRYS ER 20 MEQ PO TBCR: 10.0000 meq | EXTENDED_RELEASE_TABLET | Freq: Every day | ORAL | Status: DC

## 2014-10-06 NOTE — Assessment & Plan Note (Signed)
Blood pressure looks very good. No changes to current therapy.

## 2014-10-06 NOTE — Patient Instructions (Signed)
CONTINUE DOING DAILY WEIGHTS  DECREASE FUROSEMIDE TO 20 MG ( 1/2 TABLET OF 40 MG ) DAILY  DECREASE POTASSIUM TO 10 MEQ ( 1/2 TABLET OF 20 MEQ) DAILY.  PLEASE HAVE LABS DONE TODAY----BMET  SCHEDULE IN 3 MONTHS ---Your physician has requested that you have an echocardiogram. Echocardiography is a painless test that uses sound waves to create images of your heart. It provides your doctor with information about the size and shape of your heart and how well your heart's chambers and valves are working. This procedure takes approximately one hour. There are no restrictions for this procedure.  Your physician recommends that you schedule a follow-up appointment in Dr Royann Shivers in 3 months after the eco is completed.

## 2014-10-06 NOTE — Assessment & Plan Note (Addendum)
Patient appears euvolemic. He's had some improvement of kidney function on his bmet from May 11.  When a decrease his Lasix to 20 mg daily and decrease his potassium to 10. He'll continue to monitor his weight which is stable.  Repeat echocardiogram in 3 months and follow with Dr. Royann Shivers after.  Continue to hold ACE-I for now.

## 2014-10-06 NOTE — Assessment & Plan Note (Signed)
Recheck basic metabolic panel today. 

## 2014-10-06 NOTE — Progress Notes (Signed)
Patient ID: Shawn [name], male   DOB: Sep 19, 1985, 29 y.o.   MRN: [id number]    Date:  10/06/2014   ID:  Shawn [name], DOB 1986-04-03, MRN [id number]  PCP:  No PCP Per Patient  Primary Cardiologist:  Croitoru   Chief Complaint  Patient presents with  . Hospitalization Follow-up    no chest pain or leg pain or swelling would like to know status with his heart     History of Present Illness: Shawn [name] is a 29 y.o. male with PMH of HTN, Obesity, CKD- stage 2, HLD, presented recently to the ED with 2 week hx of SOB and Cough was diagnosed with acute heart failure. He was diuresed.  ACE inhibitor was held until follow-up labs. He was sent home on hydralazine and long-acting nitroglycerin. Echocardiogram revealed ejection fraction of 35-40% with diffuse hypokinesia kinesis. There was moderate LVH. There is akinesis of the basal inferior myocardium. Was mild AI, mild MR, small pericardial effusion.  There are extremity venous Dopplers were negative for DVT.    She presents for posthospital evaluation. He reports doing well. No more orthopnea, lower extremity edema, shortness of breath.  He has changed his diet completely and is not eating any fast food or sodium. Eating lots of oatmeal, broccoli, boiled eggs.  He does report that he's had some dizziness with position change. This only last a second or 2.  The patient currently denies nausea, vomiting, fever, chest pain, cough, congestion, abdominal pain, hematochezia, melena, lower extremity edema, claudication.  Wt Readings from Last 3 Encounters:  10/06/14 274 lb 11.2 oz (124.603 kg)  09/27/14 278 lb (126.1 kg)  09/21/14 279 lb 6.4 oz (126.735 kg)     Past Medical History  Diagnosis Date  . Hypertension   . CHF (congestive heart failure)     Current Outpatient Prescriptions  Medication Sig Dispense Refill  . amLODipine (NORVASC) 5 MG tablet Take 1 tablet (5 mg total) by mouth daily. 30 tablet 2  . aspirin 81 MG tablet  Take 1 tablet (81 mg total) by mouth daily. 30 tablet 2  . carvedilol (COREG) 25 MG tablet Take 1 tablet (25 mg total) by mouth 2 (two) times daily with a meal. 60 tablet 2  . furosemide (LASIX) 40 MG tablet Take 1 tablet (40 mg total) by mouth daily. 30 tablet 2  . hydrALAZINE (APRESOLINE) 50 MG tablet Take 1 tablet (50 mg total) by mouth 3 (three) times daily. 90 tablet 2  . isosorbide mononitrate (IMDUR) 60 MG 24 hr tablet Take 1 tablet (60 mg total) by mouth 2 (two) times daily. 60 tablet 2  . potassium chloride SA (K-DUR,KLOR-CON) 20 MEQ tablet Take 1 tablet (20 mEq total) by mouth daily. 30 tablet 2  . simvastatin (ZOCOR) 20 MG tablet Take 1 tablet (20 mg total) by mouth daily. 30 tablet 2   No current facility-administered medications for this visit.    Allergies:   No Known Allergies  Social History:  The patient  reports that he quit smoking about 4 months ago. His smoking use included Cigarettes. He smoked 0.50 packs per day. He does not have any smokeless tobacco history on file. He reports that he uses illicit drugs (Marijuana). He reports that he does not drink alcohol.   Family history:   Family History  Problem Relation Age of Onset  . Diabetes Mother   . Hypertension Mother     ROS:  Please see the history of present illness.  All other systems reviewed and negative.   PHYSICAL EXAM: VS:  BP 122/84 mmHg  Pulse 95  Ht  (1.803 m)  Wt 274 lb 11.2 oz (124.603 kg)  BMI 38.33 kg/m2 Well nourished, well developed, in no acute distress HEENT: Pupils are equal round react to light accommodation extraocular movements are intact.  Neck: no JVDNo cervical lymphadenopathy. Cardiac: Regular rate and rhythm without murmurs rubs or gallops. Lungs:  clear to auscultation bilaterally, no wheezing, rhonchi or rales Abd: soft, nontender, positive bowel sounds all quadrants, no hepatosplenomegaly Ext: no lower extremity edema.  2+ radial and dorsalis pedis pulses. Skin: warm and  dry Neuro:  Grossly normal  EKG:  *Normal sinus rhythm with right atrial enlargement right axis deviation. Rate 95 bpm   ASSESSMENT AND PLAN:  Problem List Items Addressed This Visit    Nonischemic cardiomyopathy - Primary    Patient appears euvolemic. He's had some improvement of kidney function on his bmet from May 11.  When a decrease his Lasix to 20 mg daily and decrease his potassium to 10. He'll continue to monitor his weight which is stable.  Repeat echocardiogram in 3 months and follow with Dr. Royann Shivers after.  Continue to hold ACE-I for now.      Essential hypertension (Chronic)    Blood pressure looks very good. No changes to current therapy.      CKD (chronic kidney disease) stage 3, GFR 30-59 ml/min    Recheck basic metabolic panel today.       Other Visit Diagnoses    Non-ischemic cardiomyopathy

## 2014-10-06 NOTE — Assessment & Plan Note (Signed)
Patient is made considerable changes to his diet. Encouraged him to continue this.

## 2014-10-07 LAB — BASIC METABOLIC PANEL
BUN: 19 mg/dL (ref 6–23)
CALCIUM: 9 mg/dL (ref 8.4–10.5)
CO2: 26 meq/L (ref 19–32)
CREATININE: 1.8 mg/dL — AB (ref 0.50–1.35)
Chloride: 103 mEq/L (ref 96–112)
Glucose, Bld: 90 mg/dL (ref 70–99)
Potassium: 4.2 mEq/L (ref 3.5–5.3)
SODIUM: 136 meq/L (ref 135–145)

## 2014-10-10 ENCOUNTER — Encounter: Payer: Self-pay | Admitting: Family Medicine

## 2014-10-10 ENCOUNTER — Telehealth: Payer: Self-pay | Admitting: Cardiovascular Disease

## 2014-10-10 ENCOUNTER — Ambulatory Visit: Payer: Self-pay | Attending: Family Medicine | Admitting: Family Medicine

## 2014-10-10 VITALS — BP 120/77 | HR 88 | Temp 98.7°F | Resp 16 | Ht 71.5 in | Wt 272.0 lb

## 2014-10-10 DIAGNOSIS — N189 Chronic kidney disease, unspecified: Secondary | ICD-10-CM

## 2014-10-10 DIAGNOSIS — R938 Abnormal findings on diagnostic imaging of other specified body structures: Secondary | ICD-10-CM

## 2014-10-10 DIAGNOSIS — I130 Hypertensive heart and chronic kidney disease with heart failure and stage 1 through stage 4 chronic kidney disease, or unspecified chronic kidney disease: Secondary | ICD-10-CM | POA: Insufficient documentation

## 2014-10-10 DIAGNOSIS — N183 Chronic kidney disease, stage 3 (moderate): Secondary | ICD-10-CM

## 2014-10-10 DIAGNOSIS — R9389 Abnormal findings on diagnostic imaging of other specified body structures: Secondary | ICD-10-CM

## 2014-10-10 DIAGNOSIS — I509 Heart failure, unspecified: Secondary | ICD-10-CM

## 2014-10-10 DIAGNOSIS — N179 Acute kidney failure, unspecified: Secondary | ICD-10-CM

## 2014-10-10 DIAGNOSIS — I129 Hypertensive chronic kidney disease with stage 1 through stage 4 chronic kidney disease, or unspecified chronic kidney disease: Secondary | ICD-10-CM

## 2014-10-10 DIAGNOSIS — I5021 Acute systolic (congestive) heart failure: Secondary | ICD-10-CM

## 2014-10-10 NOTE — Assessment & Plan Note (Signed)
1. Congestive heart failure with kidney impairment due to hypertension: BP is great Weight is great Your diet and exercise pattern is great  Avoid: NSAID, aleve, ibuprogen, BCs, Goodys Take all meds Weigh daily Continue low salt and low fat diet

## 2014-10-10 NOTE — Patient Instructions (Addendum)
Mr. Scola,  Thank you for coming in today  1. Congestive heart failure with kidney impairment due to hypertension: BP is great Weight is great Your diet and exercise pattern is great  Avoid: NSAID, aleve, ibuprogen, BCs, Goodys Take all meds Weigh daily Continue low salt and low fat diet  2. Abnormal CT chest done on 09/18/2014:  F/u in 8-10 weeks recommended F/u CT scheduled  F/u in 10 days for lab visit to check potassium and kidney function F/u with me in 2 months for HTN, CHF, kidney impairment   I recommend checking in with nephrologist   Dr. Armen Pickup

## 2014-10-10 NOTE — Telephone Encounter (Signed)
Received records from [facility] for appointment with Dr Royann Shivers on 11/03/14.  Records given to [facility] (medical records) for Dr Croitoru's schedule on 11/03/14. lp

## 2014-10-10 NOTE — Assessment & Plan Note (Signed)
A: BP is now well controlled on current regimen Med: compliant P:  Continue current regimen F/u in 8 weeks

## 2014-10-10 NOTE — Progress Notes (Signed)
Establish Care F/u Heart Failure

## 2014-10-10 NOTE — Progress Notes (Signed)
   Subjective:    Patient ID: Shawn [name], male    DOB: [birth year], 30 y.o.   MRN: [id number] CC: f/u systolic CHF, CHF, acute on chronic renal diease  HPI  1.  Systolic CHF: compliant with medication regimen. Saw his cardiologist 4 days ago. Feeling much better than he did when hospitalized. Motivated to get well. He is eating low salt and low fat.   2. HTN: eating low salt. Compliant with regimen. No CP or SOB. Exercising.   3. CKD: acute on chronic with elevated Cr in setting of CHF and HTN. Cr improving. Baseline is 1.5-1.6 from 2015. Peaked at 2.2 on 09/20/14. Patient has been referred to ne[phrology. He is concerned about co-pay cost. He is applying for medicaid and the orange card. No swelling. No NSAIDs.   4. CT chest: done on 09/18/14 was abnormal with adenopathy. No fever, chills, night sweats or unintentional weight loss.   Soc Hx: non smoker  Med Hx: malignant HTN Review of Systems  Constitutional: Negative for unexpected weight change.  Respiratory: Negative for cough, choking, chest tightness and shortness of breath.   Cardiovascular: Negative for chest pain, palpitations and leg swelling.      Objective:   Physical Exam BP 120/77 mmHg  Pulse 88  Temp(Src) 98.7 F (37.1 C) (Oral)  Resp 16  Ht 5' 11.5" (1.816 m)  Wt 272 lb (123.378 kg)  BMI 37.41 kg/m2  SpO2 100% General appearance: alert, cooperative and no distress  Neck: no adenopathy, no carotid bruit, no JVD, supple, symmetrical, trachea midline and thyroid not enlarged, symmetric, no tenderness/mass/nodules Lungs: clear to auscultation bilaterally Heart: regular rate and rhythm, S1, S2 normal, no murmur, click, rub or gallop Extremities: extremities normal, atraumatic, no cyanosis or edema     Assessment & Plan:

## 2014-10-10 NOTE — Assessment & Plan Note (Signed)
  2. Abnormal CT chest done on 09/18/2014:  F/u in 8-10 weeks recommended F/u CT scheduled

## 2014-10-10 NOTE — Telephone Encounter (Signed)
Received records from [facility] for appointment on 11/03/14 with Dr Royann Shivers.  Records given to [facility] (medical records) for Dr Croitoru's schedule on 11/03/14. lp

## 2014-10-23 ENCOUNTER — Ambulatory Visit: Payer: Self-pay | Attending: Family Medicine

## 2014-10-23 DIAGNOSIS — N189 Chronic kidney disease, unspecified: Principal | ICD-10-CM

## 2014-10-23 DIAGNOSIS — N179 Acute kidney failure, unspecified: Secondary | ICD-10-CM

## 2014-10-23 LAB — BASIC METABOLIC PANEL
BUN: 24 mg/dL — ABNORMAL HIGH (ref 6–23)
CHLORIDE: 108 meq/L (ref 96–112)
CO2: 24 meq/L (ref 19–32)
Calcium: 9.3 mg/dL (ref 8.4–10.5)
Creat: 1.88 mg/dL — ABNORMAL HIGH (ref 0.50–1.35)
GLUCOSE: 92 mg/dL (ref 70–99)
POTASSIUM: 4.3 meq/L (ref 3.5–5.3)
SODIUM: 139 meq/L (ref 135–145)

## 2014-11-03 ENCOUNTER — Ambulatory Visit (INDEPENDENT_AMBULATORY_CARE_PROVIDER_SITE_OTHER): Payer: Self-pay | Admitting: Cardiovascular Disease

## 2014-11-03 ENCOUNTER — Telehealth: Payer: Self-pay | Admitting: Family Medicine

## 2014-11-03 ENCOUNTER — Encounter: Payer: Self-pay | Admitting: Cardiovascular Disease

## 2014-11-03 VITALS — BP 132/92 | HR 74 | Ht 71.0 in | Wt 272.3 lb

## 2014-11-03 DIAGNOSIS — N179 Acute kidney failure, unspecified: Secondary | ICD-10-CM

## 2014-11-03 DIAGNOSIS — I5042 Chronic combined systolic (congestive) and diastolic (congestive) heart failure: Secondary | ICD-10-CM | POA: Insufficient documentation

## 2014-11-03 DIAGNOSIS — I428 Other cardiomyopathies: Secondary | ICD-10-CM

## 2014-11-03 DIAGNOSIS — E669 Obesity, unspecified: Secondary | ICD-10-CM

## 2014-11-03 DIAGNOSIS — I509 Heart failure, unspecified: Secondary | ICD-10-CM

## 2014-11-03 DIAGNOSIS — I429 Cardiomyopathy, unspecified: Secondary | ICD-10-CM

## 2014-11-03 DIAGNOSIS — I5022 Chronic systolic (congestive) heart failure: Secondary | ICD-10-CM

## 2014-11-03 DIAGNOSIS — I130 Hypertensive heart and chronic kidney disease with heart failure and stage 1 through stage 4 chronic kidney disease, or unspecified chronic kidney disease: Secondary | ICD-10-CM

## 2014-11-03 DIAGNOSIS — N183 Chronic kidney disease, stage 3 (moderate): Secondary | ICD-10-CM

## 2014-11-03 DIAGNOSIS — I129 Hypertensive chronic kidney disease with stage 1 through stage 4 chronic kidney disease, or unspecified chronic kidney disease: Secondary | ICD-10-CM

## 2014-11-03 MED ORDER — AMLODIPINE BESYLATE 10 MG PO TABS: 10.0000 mg | ORAL_TABLET | Freq: Every day | ORAL | Status: DC

## 2014-11-03 NOTE — Patient Instructions (Signed)
Medication Instructions:   Increase Amlodipine to 10mg  daily.  A new Rx has been sent to your pharmacy.  Labwork:  none  Testing/Procedures:  Your physician has requested that you have an echocardiogram the week of August 3rd . Echocardiography is a painless test that uses sound waves to create images of your heart. It provides your doctor with information about the size and shape of your heart and how well your heart's chambers and valves are working. This procedure takes approximately one hour. There are no restrictions for this procedure.    Follow-Up:  First available after the Echo is done.  Any Other Special Instructions Will Be Listed Below (If Applicable).

## 2014-11-03 NOTE — Telephone Encounter (Signed)
Pt called and left a vm requesting his results from his lab work. Please follow up with pt. Thank you.

## 2014-11-03 NOTE — Progress Notes (Signed)
Patient ID: Shawn [name], male   DOB: [birth year], 29 y.o.   MRN: [id number]     Cardiology Office Note   Date:  11/03/2014   ID:  Shawn [name], DOB 03-05-1986, MRN [id number]  PCP:  Lora Paula, MD  Cardiologist:   Thurmon Fair, MD   Chief Complaint  Patient presents with  . New Evaluation     no chest pain, no shortness of breath, no edema, no pain in legs, no cramping in legs,no lightheadedness, no dizziness      History of Present Illness: Shawn [name] is a 29 y.o. male who presents for follow-up roughly 1 month following his initial diagnosis of congestive heart failure. He has evidence of malignant hypertension complicated by chronic kidney disease stage III as well as severely depressed left ventricular systolic function. By echocardiography his ejection fraction was officially reported as being around 35%, but on my review his ejection fraction was much worse at roughly 25-30 percent. He has responded very well to treatment with a relatively low dose of diuretic so as well as a fairly complex regimen of antihypertensives/heart failure medications. Due to his renal insufficiency (that appeared to worsen when we initiated ACE inhibitor therapy) he is taking carvedilol, hydralazine, isosorbide mononitrate and amlodipine. He has tolerated gradual titration of these medications very well. He denies chest pain, dyspnea, edema, erectile dysfunction, intermittent claudication, dizziness, palpitations or syncope. He has actually returned to the gym and is exercising on the treadmill, elliptical and using 20 pound dumbbells without any difficulty. He is quite muscular but also moderate to severely obese with a BMI of around 38. He has residual phimosis and adhesions and is planning to undergo circumcision revision under general anesthesia with Dr. Brunilda Payor.    Past Medical History  Diagnosis Date  . Hypertension     at age 32  . CHF (congestive heart failure) Sep 18 2014     No past surgical history on file.   Current Outpatient Prescriptions  Medication Sig Dispense Refill  . amLODipine (NORVASC) 10 MG tablet Take 1 tablet (10 mg total) by mouth daily. 90 tablet 1  . aspirin 81 MG tablet Take 1 tablet (81 mg total) by mouth daily. 30 tablet 2  . carvedilol (COREG) 25 MG tablet Take 1 tablet (25 mg total) by mouth 2 (two) times daily with a meal. 60 tablet 2  . furosemide (LASIX) 40 MG tablet Take 0.5 tablets (20 mg total) by mouth daily. 30 tablet 6  . hydrALAZINE (APRESOLINE) 50 MG tablet Take 1 tablet (50 mg total) by mouth 3 (three) times daily. 90 tablet 2  . isosorbide mononitrate (IMDUR) 60 MG 24 hr tablet Take 1 tablet (60 mg total) by mouth 2 (two) times daily. 60 tablet 2  . potassium chloride SA (K-DUR,KLOR-CON) 20 MEQ tablet Take 0.5 tablets (10 mEq total) by mouth daily. 30 tablet 6  . simvastatin (ZOCOR) 20 MG tablet Take 1 tablet (20 mg total) by mouth daily. 30 tablet 2   No current facility-administered medications for this visit.    Allergies:   Review of patient's allergies indicates no known allergies.    Social History:  The patient  reports that he quit smoking about 5 months ago. His smoking use included Cigarettes. He smoked 0.50 packs per day. He does not have any smokeless tobacco history on file. He reports that he uses illicit drugs (Marijuana). He reports that he does not drink alcohol.   Family History:  The patient's  family history includes Diabetes in his mother; Hypertension in his mother.    ROS:  Please see the history of present illness.    Otherwise, review of systems positive for none.   All other systems are reviewed and negative.    PHYSICAL EXAM: VS:  BP 132/92 mmHg  Pulse 74  Ht 5\' 11"  (1.803 m)  Wt 272 lb 4.8 oz (123.514 kg)  BMI 37.99 kg/m2 , BMI Body mass index is 37.99 kg/(m^2).  General: Alert, oriented x3, no distress Head: no evidence of trauma, PERRL, EOMI, no exophtalmos or lid lag, no  myxedema, no xanthelasma; normal ears, nose and oropharynx Neck: normal jugular venous pulsations and no hepatojugular reflux; brisk carotid pulses without delay and no carotid bruits Chest: clear to auscultation, no signs of consolidation by percussion or palpation, normal fremitus, symmetrical and full respiratory excursions Cardiovascular: Lateral displacement of the apical impulse, regular rhythm, normal first and second heart sounds, no murmurs, rubs or gallops Abdomen: no tenderness or distention, no masses by palpation, no abnormal pulsatility or arterial bruits, normal bowel sounds, no hepatosplenomegaly Extremities: no clubbing, cyanosis or edema; 2+ radial, ulnar and brachial pulses bilaterally; 2+ right femoral, posterior tibial and dorsalis pedis pulses; 2+ left femoral, posterior tibial and dorsalis pedis pulses; no subclavian or femoral bruits Neurological: grossly nonfocal Psych: euthymic mood, full affect   EKG:  EKG is not ordered today.   Recent Labs: 09/18/2014: B Natriuretic Peptide 391.7*; Hemoglobin 14.0; Platelets 316; TSH 2.057 10/23/2014: BUN 24*; Creat 1.88*; Potassium 4.3; Sodium 139    Lipid Panel No results found for: CHOL, TRIG, HDL, CHOLHDL, VLDL, LDLCALC, LDLDIRECT    Wt Readings from Last 3 Encounters:  11/03/14 272 lb 4.8 oz (123.514 kg)  10/10/14 272 lb (123.378 kg)  10/06/14 274 lb 11.2 oz (124.603 kg)     ASSESSMENT AND PLAN:  Kore has malignant hypertension complicated by heart failure with reduced left ventricular ejection fraction and moderate renal failure.   He has had marked symptomatic improvement and has NYHA functional class I. He is maintaining euvolemic status on a very low dose of loop diuretic.  Blood pressure control is greatly improved but not yet optimal, with a diastolic blood pressure of 92 mmHg. (Albeit with a blood pressure of 122/84 on May 20). Will increase amlodipine to 10 mg daily. Plan repeat evaluation of his left  ventricular systolic function by echocardiography in August. Anticipate that we will see substantial improvement. I don't think his ejection fraction will remain in a range where we have to consider defibrillator therapy, but I'm not sure that we will see complete recovery. Renal function will need close monitoring.  The enforced importance of sodium restriction and a long-term goal of substantial weight loss. I don't think a target BMI of less than 25 is appropriate for this muscular, broadly built gentleman, but he probably needs to lose at least 50 pounds to achieve a healthy weight.  His heart failure is very well compensated and I'm confident that with the most recent adjustment in medications we will have a good handle on his blood pressure. I think his risk of major perioperative cardiac complications is low with the planned general anesthesia for revision of circumcision. It is important that excessive intravenous fluids be avoided and that he take all of his antihypertensives medications on the day of surgery.   Current medicines are reviewed at length with the patient today.  The patient does not have concerns regarding medicines.  The following changes  have been made:  Increase amlodipine 10 mg daily  Labs/ tests ordered today include:  Orders Placed This Encounter  Procedures  . ECHOCARDIOGRAM COMPLETE   Patient Instructions  Medication Instructions:   Increase Amlodipine to  daily.  A new Rx has been sent to your pharmacy.  Labwork:  none  Testing/Procedures:  Your physician has requested that you have an echocardiogram the week of August 3rd . Echocardiography is a painless test that uses sound waves to create images of your heart. It provides your doctor with information about the size and shape of your heart and how well your heart's chambers and valves are working. This procedure takes approximately one hour. There are no restrictions for this  procedure.    Follow-Up:  First available after the Echo is done.  Any Other Special Instructions Will Be Listed Below (If Applicable).     Joie Bimler, MD  11/03/2014 6:20 PM    Thurmon Fair, MD, [facility] [phone] office [phone] pager

## 2014-11-06 ENCOUNTER — Ambulatory Visit: Payer: Self-pay

## 2014-11-06 ENCOUNTER — Other Ambulatory Visit: Payer: Self-pay | Admitting: Urology

## 2014-11-08 ENCOUNTER — Encounter (HOSPITAL_BASED_OUTPATIENT_CLINIC_OR_DEPARTMENT_OTHER): Payer: Self-pay | Admitting: *Deleted

## 2014-11-09 NOTE — Telephone Encounter (Signed)
Pt aware of results     Notes Recorded by Dessa Phi, MD on 10/24/2014 at 9:12 AM Creatinine slightly increased from 1.8 to 1.9.  Normal potassium Continue current plan

## 2014-11-10 ENCOUNTER — Encounter (HOSPITAL_BASED_OUTPATIENT_CLINIC_OR_DEPARTMENT_OTHER): Payer: Self-pay | Admitting: *Deleted

## 2014-11-10 NOTE — Progress Notes (Signed)
   11/10/14 0953  OBSTRUCTIVE SLEEP APNEA  Have you ever been diagnosed with sleep apnea through a sleep study? No  Do you snore loudly (loud enough to be heard through closed doors)?  1  Do you often feel tired, fatigued, or sleepy during the daytime? 0  Has anyone observed you stop breathing during your sleep? 1  Do you have, or are you being treated for high blood pressure? 1  BMI more than 35 kg/m2? 0  Age over 30 years old? 0  Gender: 0  Obstructive Sleep Apnea Score 4

## 2014-11-10 NOTE — Progress Notes (Signed)
NPO AFTER MN.  ARRIVE AT 1962.  NEEDS ISTAT.  CURRENT EKG IN CHART AND EPIC.  WILL TAKE NORVASC, IMDUR, ZOCOR, AND COREG  AM DOS W/ SIPS OF WATER.

## 2014-11-13 NOTE — H&P (Signed)
History of Present Illness Shawn [name] presents today complaining of difficulty retracting his foreskin all his life. It has been increasingly more difficult for him to retract his foreskin. He would like to be circumcised. 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 He has penile pain and intercourse is also painful. He was recently diagnosed with congestive heart failure due to hypertension.   Past Medical History Problems  1. History of heart failure (Z86.79) 2. History of hypertension (Z86.79)  Surgical History Problems  1. History of No Surgical Problems  Current Meds 1. AmLODIPine Besylate 5 MG Oral Tablet;  Therapy: (Recorded:23May2016) to Recorded 2. Aspirin 81 MG TABS;  Therapy: (Recorded:23May2016) to Recorded 3. Carvedilol 25 MG Oral Tablet;  Therapy: (Recorded:23May2016) to Recorded 4. HydrALAZINE HCl - 50 MG Oral Tablet;  Therapy: (Recorded:23May2016) to Recorded 5. Isosorbide Mononitrate ER 60 MG Oral Tablet Extended Release 24 Hour;  Therapy: (Recorded:23May2016) to Recorded 6. Simvastatin 20 MG Oral Tablet;  Therapy: (Recorded:23May2016) to Recorded  Allergies Medication  1. No Known Drug Allergies  Family History Problems  1. Family history of diabetes mellitus (Z83.3) : Mother 2. Family history of hypertension (Z82.49) : Mother  Social History Problems    Father alive and healthy   20   Former smoker (Z87.891)   Mother alive and healthy   63   Number of children   1 son  Review of Systems Genitourinary, constitutional, skin, eye, otolaryngeal, hematologic/lymphatic, cardiovascular, pulmonary, endocrine, musculoskeletal, gastrointestinal, neurological and psychiatric system(s) were reviewed and pertinent findings if present are noted and are otherwise negative.  Genitourinary: penile pain.     Vitals Vital Signs [Data Includes: Last 1 Day]  Recorded: 23May2016 02:36PM  Height: 5 ft 11 in Weight: 278 lb  BMI Calculated: 38.77 BSA Calculated: 2.43 Blood Pressure: 120 / 78 Heart Rate: 87 Respiration: 18  Physical Exam Constitutional: Well nourished and well developed . No acute distress.  ENT:. The ears and nose are normal in appearance.  Neck: The appearance of the neck is normal and no neck mass is present.  Pulmonary: No respiratory distress and normal respiratory rhythm and effort.  Cardiovascular: Heart rate and rhythm are normal . No peripheral edema.  Abdomen: The abdomen is soft and nontender. No masses are palpated. No CVA tenderness. No hernias are palpable. No hepatosplenomegaly noted.  Genitourinary: Examination of the penis demonstrates no discharge, no masses, no lesions and a normal meatus. The scrotum is without lesions. The right epididymis is palpably normal and non-tender. The left epididymis is palpably normal and non-tender. The right testis is non-tender and without masses. The left testis is non-tender and without masses.  Lymphatics: The femoral and inguinal nodes are not enlarged or tender.  Skin: Normal skin turgor, no visible rash and no visible skin lesions.  Neuro/Psych:. Mood and affect are appropriate.    Results/Data Urine [Data Includes: Last 1 Day]   23May2016 COLOR YELLOW  APPEARANCE CLEAR  SPECIFIC GRAVITY 1.025  pH 5.5  GLUCOSE NEG mg/dL BILIRUBIN NEG  KETONE NEG mg/dL BLOOD NEG  PROTEIN TRACE mg/dL UROBILINOGEN 0.2 mg/dL NITRITE NEG  LEUKOCYTE ESTERASE NEG   Assessment Assessed  1. 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 Phimosis (N47.1)  Plan Health Maintenance  1. UA With REFLEX; [Do Not Release]; Status:Complete;   Done: 23May2016 02:12PM  He needs lysis of penile adhesions and revision of  circumcision. The procedure, risks, benefits were discussed with the patient. The risks include but are not limited to  hemorrhage, infection, hematoma, recurrence of adhesions. He understands and wishes to proceed. He will need medical clearance from Dr Royann Shivers. He is cleared by Dr Royann Shivers.  Will proceed.

## 2014-11-14 ENCOUNTER — Ambulatory Visit (HOSPITAL_BASED_OUTPATIENT_CLINIC_OR_DEPARTMENT_OTHER): Payer: Medicaid Other | Admitting: Anesthesiology

## 2014-11-14 ENCOUNTER — Ambulatory Visit (HOSPITAL_BASED_OUTPATIENT_CLINIC_OR_DEPARTMENT_OTHER)
Admission: RE | Admit: 2014-11-14 | Discharge: 2014-11-14 | Disposition: A | Discharge status: Home / Self Care | Payer: Medicaid Other | Source: Ambulatory Visit | Attending: Urology | Admitting: Urology

## 2014-11-14 ENCOUNTER — Encounter (HOSPITAL_BASED_OUTPATIENT_CLINIC_OR_DEPARTMENT_OTHER): Payer: Self-pay | Admitting: Anesthesiology

## 2014-11-14 ENCOUNTER — Encounter (HOSPITAL_BASED_OUTPATIENT_CLINIC_OR_DEPARTMENT_OTHER)
Admission: RE | Disposition: A | Discharge status: Home / Self Care | Payer: Self-pay | Source: Ambulatory Visit | Attending: Urology

## 2014-11-14 DIAGNOSIS — Z7982 Long term (current) use of aspirin: Secondary | ICD-10-CM | POA: Insufficient documentation

## 2014-11-14 DIAGNOSIS — I509 Heart failure, unspecified: Secondary | ICD-10-CM | POA: Diagnosis not present

## 2014-11-14 DIAGNOSIS — N478 Other disorders of prepuce: Secondary | ICD-10-CM | POA: Diagnosis present

## 2014-11-14 DIAGNOSIS — N471 Phimosis: Secondary | ICD-10-CM | POA: Insufficient documentation

## 2014-11-14 DIAGNOSIS — Z6838 Body mass index (BMI) 38.0-38.9, adult: Secondary | ICD-10-CM | POA: Diagnosis not present

## 2014-11-14 DIAGNOSIS — Z87891 Personal history of nicotine dependence: Secondary | ICD-10-CM | POA: Insufficient documentation

## 2014-11-14 DIAGNOSIS — N4889 Other specified disorders of penis: Secondary | ICD-10-CM | POA: Diagnosis not present

## 2014-11-14 DIAGNOSIS — I110 Hypertensive heart disease with heart failure: Secondary | ICD-10-CM | POA: Diagnosis not present

## 2014-11-14 DIAGNOSIS — Z79899 Other long term (current) drug therapy: Secondary | ICD-10-CM | POA: Insufficient documentation

## 2014-11-14 HISTORY — DX: Other forms of dyspnea: R06.09

## 2014-11-14 HISTORY — DX: Essential (primary) hypertension: I10

## 2014-11-14 HISTORY — DX: Adhesions of prepuce and glans penis: N47.5

## 2014-11-14 HISTORY — DX: Chronic combined systolic (congestive) and diastolic (congestive) heart failure: I50.42

## 2014-11-14 HISTORY — DX: Chronic kidney disease, stage 3 unspecified: N18.30

## 2014-11-14 HISTORY — PX: CIRCUMCISION REVISION: SHX6634

## 2014-11-14 HISTORY — DX: Dyspnea, unspecified: R06.00

## 2014-11-14 HISTORY — DX: Hyperlipidemia, unspecified: E78.5

## 2014-11-14 HISTORY — DX: Other specified personal risk factors, not elsewhere classified: Z91.89

## 2014-11-14 HISTORY — DX: Chronic kidney disease, stage 3 (moderate): N18.3

## 2014-11-14 HISTORY — DX: Other cardiomyopathies: I42.8

## 2014-11-14 LAB — POCT I-STAT 4, (NA,K, GLUC, HGB,HCT)
GLUCOSE: 99 mg/dL (ref 65–99)
HCT: 43 % (ref 39.0–52.0)
Hemoglobin: 14.6 g/dL (ref 13.0–17.0)
Potassium: 4.2 mmol/L (ref 3.5–5.1)
Sodium: 139 mmol/L (ref 135–145)

## 2014-11-14 SURGERY — REVISION, CIRCUMCISION
Anesthesia: General

## 2014-11-14 MED ORDER — MIDAZOLAM HCL 2 MG/2ML IJ SOLN
INTRAMUSCULAR | Status: AC
  Filled 2014-11-14: qty 2

## 2014-11-14 MED ORDER — OXYCODONE HCL 5 MG/5ML PO SOLN
5.0000 mg | Freq: Once | ORAL | Status: DC | PRN
  Filled 2014-11-14: qty 5

## 2014-11-14 MED ORDER — SODIUM CHLORIDE 0.9 % IV SOLN
INTRAVENOUS | Status: DC
  Administered 2014-11-14 (×2): via INTRAVENOUS
  Filled 2014-11-14: qty 1000

## 2014-11-14 MED ORDER — FENTANYL CITRATE (PF) 100 MCG/2ML IJ SOLN
INTRAMUSCULAR | Status: AC
  Filled 2014-11-14: qty 2

## 2014-11-14 MED ORDER — BUPIVACAINE HCL 0.25 % IJ SOLN
INTRAMUSCULAR | Status: DC | PRN
  Administered 2014-11-14: 10 mL

## 2014-11-14 MED ORDER — LACTATED RINGERS IV SOLN
INTRAVENOUS | Status: DC
  Filled 2014-11-14: qty 1000

## 2014-11-14 MED ORDER — MIDAZOLAM HCL 5 MG/5ML IJ SOLN
INTRAMUSCULAR | Status: DC | PRN
  Administered 2014-11-14: 2 mg via INTRAVENOUS

## 2014-11-14 MED ORDER — BACITRACIN-NEOMYCIN-POLYMYXIN 400-5-5000 EX OINT
TOPICAL_OINTMENT | CUTANEOUS | Status: DC | PRN
  Administered 2014-11-14: 1 via TOPICAL

## 2014-11-14 MED ORDER — EPHEDRINE SULFATE 50 MG/ML IJ SOLN
INTRAMUSCULAR | Status: DC | PRN
  Administered 2014-11-14: 10 mg via INTRAVENOUS
  Administered 2014-11-14: 15 mg via INTRAVENOUS

## 2014-11-14 MED ORDER — LIDOCAINE HCL (CARDIAC) 20 MG/ML IV SOLN
INTRAVENOUS | Status: DC | PRN
  Administered 2014-11-14: 70 mg via INTRAVENOUS

## 2014-11-14 MED ORDER — OXYCODONE HCL 5 MG PO TABS
5.0000 mg | ORAL_TABLET | Freq: Once | ORAL | Status: DC | PRN
  Filled 2014-11-14: qty 1

## 2014-11-14 MED ORDER — HYDROCODONE-ACETAMINOPHEN 5-325 MG PO TABS: 1.0000 | ORAL_TABLET | Freq: Four times a day (QID) | ORAL | Status: DC | PRN

## 2014-11-14 MED ORDER — MINERAL OIL LIGHT 100 % EX OIL
TOPICAL_OIL | CUTANEOUS | Status: DC | PRN
  Administered 2014-11-14: 1 via TOPICAL

## 2014-11-14 MED ORDER — FENTANYL CITRATE (PF) 100 MCG/2ML IJ SOLN
INTRAMUSCULAR | Status: DC | PRN
  Administered 2014-11-14: 100 ug via INTRAVENOUS

## 2014-11-14 MED ORDER — PROPOFOL 10 MG/ML IV BOLUS
INTRAVENOUS | Status: DC | PRN
  Administered 2014-11-14: 300 mg via INTRAVENOUS

## 2014-11-14 MED ORDER — ONDANSETRON HCL 4 MG/2ML IJ SOLN
4.0000 mg | Freq: Four times a day (QID) | INTRAMUSCULAR | Status: AC | PRN
  Administered 2014-11-14: 4 mg via INTRAVENOUS
  Filled 2014-11-14: qty 2

## 2014-11-14 MED ORDER — DEXAMETHASONE SODIUM PHOSPHATE 4 MG/ML IJ SOLN
INTRAMUSCULAR | Status: DC | PRN
  Administered 2014-11-14: 10 mg via INTRAVENOUS

## 2014-11-14 MED ORDER — FENTANYL CITRATE (PF) 100 MCG/2ML IJ SOLN
25.0000 ug | INTRAMUSCULAR | Status: DC | PRN
  Filled 2014-11-14: qty 1

## 2014-11-14 MED ORDER — PHENYLEPHRINE HCL 10 MG/ML IJ SOLN
INTRAMUSCULAR | Status: DC | PRN
  Administered 2014-11-14: 120 ug via INTRAVENOUS
  Administered 2014-11-14 (×2): 80 ug via INTRAVENOUS

## 2014-11-14 SURGICAL SUPPLY — 30 items
BANDAGE CO FLEX L/F 1IN X 5YD (GAUZE/BANDAGES/DRESSINGS) IMPLANT
BANDAGE CO FLEX L/F 2IN X 5YD (GAUZE/BANDAGES/DRESSINGS) ×2 IMPLANT
BLADE SURG 15 STRL LF DISP TIS (BLADE) ×1 IMPLANT
BLADE SURG 15 STRL SS (BLADE) ×2
BNDG CONFORM 2 STRL LF (GAUZE/BANDAGES/DRESSINGS) ×2 IMPLANT
COVER BACK TABLE 60X90IN (DRAPES) ×2 IMPLANT
COVER MAYO STAND STRL (DRAPES) ×2 IMPLANT
DRAPE PED LAPAROTOMY (DRAPES) ×2 IMPLANT
ELECT REM PT RETURN 9FT ADLT (ELECTROSURGICAL) ×2
ELECTRODE REM PT RTRN 9FT ADLT (ELECTROSURGICAL) ×1 IMPLANT
GAUZE VASELINE 1X8 (GAUZE/BANDAGES/DRESSINGS) ×1 IMPLANT
GLOVE BIO SURGEON STRL SZ 6.5 (GLOVE) ×1 IMPLANT
GLOVE BIO SURGEON STRL SZ7 (GLOVE) ×3 IMPLANT
GLOVE BIOGEL PI IND STRL 6.5 (GLOVE) ×1 IMPLANT
GLOVE BIOGEL PI IND STRL 7.0 (GLOVE) IMPLANT
GLOVE BIOGEL PI INDICATOR 6.5 (GLOVE) ×1
GLOVE BIOGEL PI INDICATOR 7.0 (GLOVE) ×2
GLOVE SURG SS PI 7.5 STRL IVOR (GLOVE) ×1 IMPLANT
GOWN STRL REUS W/ TWL LRG LVL3 (GOWN DISPOSABLE) ×1 IMPLANT
GOWN STRL REUS W/TWL LRG LVL3 (GOWN DISPOSABLE) ×6
NDL HYPO 25X1 1.5 SAFETY (NEEDLE) ×1 IMPLANT
NEEDLE HYPO 25X1 1.5 SAFETY (NEEDLE) ×2 IMPLANT
PACK BASIN DAY SURGERY FS (CUSTOM PROCEDURE TRAY) ×2 IMPLANT
PENCIL BUTTON HOLSTER BLD 10FT (ELECTRODE) ×2 IMPLANT
SUT CHROMIC 4 0 P 3 18 (SUTURE) ×2 IMPLANT
SUT CHROMIC 5 0 RB 1 27 (SUTURE) IMPLANT
SYR CONTROL 10ML LL (SYRINGE) ×2 IMPLANT
TOWEL OR 17X24 6PK STRL BLUE (TOWEL DISPOSABLE) ×4 IMPLANT
TRAY DSU PREP LF (CUSTOM PROCEDURE TRAY) ×2 IMPLANT
WATER STERILE IRR 500ML POUR (IV SOLUTION) ×2 IMPLANT

## 2014-11-14 NOTE — Transfer of Care (Signed)
Immediate Anesthesia Transfer of Care Note  Patient: Shawn [name]  Procedure(s) Performed: Procedure(s): CIRCUMCISION REVISION, LYSIS OF ADHESIONS (N/A)  Patient Location: PACU  Anesthesia Type:General  Level of Consciousness: awake, alert , oriented and patient cooperative  Airway & Oxygen Therapy: Patient Spontanous Breathing and Patient connected to nasal cannula oxygen  Post-op Assessment: Report given to RN and Post -op Vital signs reviewed and stable  Post vital signs: Reviewed and stable  Last Vitals:  Filed Vitals:   11/14/14 0903  BP: 151/79  Pulse: 72  Temp: 36.3 C  Resp: 18    Complications: No apparent anesthesia complications

## 2014-11-14 NOTE — Anesthesia Postprocedure Evaluation (Signed)
Anesthesia Post Note  Patient: Shawn [name]  Procedure(s) Performed: Procedure(s) (LRB): CIRCUMCISION REVISION, LYSIS OF ADHESIONS (N/A)  Anesthesia type: General  Patient location: PACU  Post pain: Pain level controlled and Adequate analgesia  Post assessment: Post-op Vital signs reviewed, Patient's Cardiovascular Status Stable, Respiratory Function Stable, Patent Airway and Pain level controlled  Last Vitals:  Filed Vitals:   11/14/14 1245  BP: 117/70  Pulse: 72  Temp:   Resp: 14    Post vital signs: Reviewed and stable  Level of consciousness: awake, alert  and oriented  Complications: No apparent anesthesia complications

## 2014-11-14 NOTE — Progress Notes (Signed)
Patient waiting for uncle to arrive to take him home

## 2014-11-14 NOTE — Discharge Instructions (Addendum)
Circumcision-Home Care Instructions  The following instructions have been prepared to help you care for yourself upon your return home today.   Wound Care & Hygiene:   You may apply ice to the penis.  This may help to decrease swelling.  Remove the dressing tomorrow.  If the dressing falls off before then, leave it off.  You may shower or bathe in 48 hours  Gently wash the penis with soap and water.  The stitches do not need to be removed.  Activity:  Do not drive or operate any equipment today.  The effects of anesthesia are still present, drowsiness may result.  Rest today, not necessarily flat bed rest, just take it easy.  You may resume your normal activity in one to two days or as indicated by your physician.  Sexual Activity:  Erection and sexual relations should be avoided for 4 weeks.  Return to Work:  One to two days or as indicated by your physician   Diet:  Drink liquids or eat a very light diet this evening.  You may resume a regular diet tomorrow.  General Expectations of your surgery:   You may have a small amount of bleeding  The penis will be swollen and bruised for approximately one week  You may wake during the night with an erection, usually this is caused by having a full bladder so you should try to urinate (pass your water) to relieve the erection or apply ice to the penis  Unexpected Observations - Call your doctor if these occur!  Persistent or heavy bleeding  Temperature of 101 degrees or more  Severe pain not relieved by medication  Return to Office  .  Call to schedule your appointment   Additional Instructions:   Patient's Signature:__________________________________________________  Physician's Signature:___________________ Nurse's Signature_______________ One to two days or as indicated by your physician   Diet:  Drink liquids or eat a very light diet this evening.  You may resume a regular diet tomorrow.  uised for approximately one  week  You may wake during the night with an erection, usually this is caused by having a full bladder so you should try to urinate (pass your water) to relieve the erection or apply ice to the penis      Post Anesthesia Home Care Instructions  Activity: Get plenty of rest for the remainder of the day. A responsible adult should stay with you for 24 hours following the procedure.  For the next 24 hours, DO NOT: -Drive a car -Advertising copywriter -Drink alcoholic beverages -Take any medication unless instructed by your physician -Make any legal decisions or sign important papers.  Meals: Start with liquid foods such as gelatin or soup. Progress to regular foods as tolerated. Avoid greasy, spicy, heavy foods. If nausea and/or vomiting occur, drink only clear liquids until the nausea and/or vomiting subsides. Call your physician if vomiting continues.  Special Instructions/Symptoms: Your throat may feel dry or sore from the anesthesia or the breathing tube placed in your throat during surgery. If this causes discomfort, gargle with warm salt water. The discomfort should disappear within 24 hours.  If you had a scopolamine patch placed behind your ear for the management of post- operative nausea and/or vomiting:  1. The medication in the patch is effective for 72 hours, after which it should be removed.  Wrap patch in a tissue and discard in the trash. Wash hands thoroughly with soap and water. 2. You may remove the patch earlier than 72  hours if you experience unpleasant side effects which may include dry mouth, dizziness or visual disturbances. 3. Avoid touching the patch. Wash your hands with soap and water after contact with the patch.

## 2014-11-14 NOTE — Op Note (Signed)
Shawn [name] is a 29 y.o.   11/14/2014  General  Preop diagnosis: Penile adhesions, redundant foreskin.  Postop diagnosis: Same  Procedure done: Lysis of penile adhesions, revision of circumcision  Surgeon: Wendie Simmer. Dj Senteno  Anesthesia: Gen.  Indication: Patient is a 29 years old male who had been complaining of difficulty retracting his foreskin all his life. He was circumcised at birth but and physical examination he has redundant foreskin and there are adhesions between the foreskin and the glans penis on its anterior aspect. He has penile pain and also during intercourse. He is scheduled today for lysis of penile adhesions and revision of circumcision.   Procedure: Patient was identified by his wrist band and proper timeout was taken.  A penile block was done with 0.5% Marcaine. The adhesions between the foreskin and the glans penis were then taken down. Then 2 circumferential incisions were made on the foreskin. The distal incision is at about 3 cm from the coronal sulcus. Then the foreskin in between those 2 incisions is excised. Hemostasis was secured with electrocautery. Skin approximation was then done with #4-0 chromic. Then the edges of the incisions on the glans penis were approximated with #4-0 chromic. There was no bleeding at the end of the procedure. Sterile dressing was then applied.  The patient tolerated the procedure well and left the OR in satisfactory condition to postanesthesia care unit  EBL: Minimal  Needles, sponges count: Correct

## 2014-11-14 NOTE — Anesthesia Procedure Notes (Signed)
Procedure Name: LMA Insertion Date/Time: 11/14/2014 10:58 AM Performed by: Tyrone Nine Pre-anesthesia Checklist: Patient identified, Timeout performed, Emergency Drugs available, Suction available and Patient being monitored Patient Re-evaluated:Patient Re-evaluated prior to inductionOxygen Delivery Method: Circle system utilized Preoxygenation: Pre-oxygenation with 100% oxygen Intubation Type: IV induction Ventilation: Mask ventilation without difficulty LMA: LMA inserted LMA Size: 5.0 Number of attempts: 1 Placement Confirmation: positive ETCO2 and breath sounds checked- equal and bilateral Tube secured with: Tape Dental Injury: Teeth and Oropharynx as per pre-operative assessment

## 2014-11-14 NOTE — Anesthesia Preprocedure Evaluation (Addendum)
Anesthesia Evaluation  Patient identified by MRN, date of birth, ID band Patient awake    Reviewed: Allergy & Precautions, NPO status , Patient's Chart, lab work & pertinent test results  Airway Mallampati: II  TM Distance: >3 FB Neck ROM: full    Dental  (+) Teeth Intact, Dental Advisory Given   Pulmonary shortness of breath, former smoker,  breath sounds clear to auscultation  Pulmonary exam normal       Cardiovascular hypertension, +CHF Rhythm:regular Rate:Normal     Neuro/Psych    GI/Hepatic   Endo/Other  Morbid obesity  Renal/GU Renal InsufficiencyRenal disease     Musculoskeletal   Abdominal   Peds  Hematology   Anesthesia Other Findings   Reproductive/Obstetrics                            Anesthesia Physical Anesthesia Plan  ASA: III  Anesthesia Plan: General   Post-op Pain Management:    Induction: Intravenous  Airway Management Planned: LMA  Additional Equipment:   Intra-op Plan:   Post-operative Plan:   Informed Consent: I have reviewed the patients History and Physical, chart, labs and discussed the procedure including the risks, benefits and alternatives for the proposed anesthesia with the patient or authorized representative who has indicated his/her understanding and acceptance.     Plan Discussed with: CRNA, Anesthesiologist and Surgeon  Anesthesia Plan Comments:         Anesthesia Quick Evaluation

## 2014-11-15 ENCOUNTER — Encounter (HOSPITAL_BASED_OUTPATIENT_CLINIC_OR_DEPARTMENT_OTHER): Payer: Self-pay | Admitting: Urology

## 2014-11-17 ENCOUNTER — Inpatient Hospital Stay (HOSPITAL_COMMUNITY): Payer: Medicaid Other

## 2014-11-17 ENCOUNTER — Emergency Department (HOSPITAL_COMMUNITY): Payer: Medicaid Other

## 2014-11-17 ENCOUNTER — Inpatient Hospital Stay (HOSPITAL_COMMUNITY)
Admission: EM | Admit: 2014-11-17 | Discharge: 2014-11-25 | DRG: 064 | Disposition: A | Discharge status: Home / Self Care | Payer: Medicaid Other | Attending: Internal Medicine | Admitting: Internal Medicine

## 2014-11-17 ENCOUNTER — Encounter (HOSPITAL_COMMUNITY): Payer: Self-pay | Admitting: Emergency Medicine

## 2014-11-17 DIAGNOSIS — N183 Chronic kidney disease, stage 3 (moderate): Secondary | ICD-10-CM | POA: Diagnosis present

## 2014-11-17 DIAGNOSIS — I129 Hypertensive chronic kidney disease with stage 1 through stage 4 chronic kidney disease, or unspecified chronic kidney disease: Secondary | ICD-10-CM | POA: Diagnosis present

## 2014-11-17 DIAGNOSIS — I5042 Chronic combined systolic (congestive) and diastolic (congestive) heart failure: Secondary | ICD-10-CM | POA: Diagnosis present

## 2014-11-17 DIAGNOSIS — E785 Hyperlipidemia, unspecified: Secondary | ICD-10-CM | POA: Diagnosis present

## 2014-11-17 DIAGNOSIS — Z79899 Other long term (current) drug therapy: Secondary | ICD-10-CM | POA: Diagnosis not present

## 2014-11-17 DIAGNOSIS — I7771 Dissection of carotid artery: Secondary | ICD-10-CM

## 2014-11-17 DIAGNOSIS — R4701 Aphasia: Secondary | ICD-10-CM | POA: Diagnosis present

## 2014-11-17 DIAGNOSIS — Z6837 Body mass index (BMI) 37.0-37.9, adult: Secondary | ICD-10-CM

## 2014-11-17 DIAGNOSIS — Z7982 Long term (current) use of aspirin: Secondary | ICD-10-CM | POA: Diagnosis not present

## 2014-11-17 DIAGNOSIS — N182 Chronic kidney disease, stage 2 (mild): Secondary | ICD-10-CM | POA: Diagnosis present

## 2014-11-17 DIAGNOSIS — E876 Hypokalemia: Secondary | ICD-10-CM | POA: Diagnosis present

## 2014-11-17 DIAGNOSIS — Z87891 Personal history of nicotine dependence: Secondary | ICD-10-CM

## 2014-11-17 DIAGNOSIS — I6340 Cerebral infarction due to embolism of unspecified cerebral artery: Principal | ICD-10-CM | POA: Diagnosis present

## 2014-11-17 DIAGNOSIS — G9340 Encephalopathy, unspecified: Secondary | ICD-10-CM

## 2014-11-17 DIAGNOSIS — I639 Cerebral infarction, unspecified: Secondary | ICD-10-CM

## 2014-11-17 DIAGNOSIS — F1290 Cannabis use, unspecified, uncomplicated: Secondary | ICD-10-CM | POA: Diagnosis present

## 2014-11-17 DIAGNOSIS — I428 Other cardiomyopathies: Secondary | ICD-10-CM

## 2014-11-17 DIAGNOSIS — I240 Acute coronary thrombosis not resulting in myocardial infarction: Secondary | ICD-10-CM | POA: Diagnosis present

## 2014-11-17 DIAGNOSIS — I420 Dilated cardiomyopathy: Secondary | ICD-10-CM | POA: Diagnosis present

## 2014-11-17 DIAGNOSIS — E669 Obesity, unspecified: Secondary | ICD-10-CM | POA: Diagnosis present

## 2014-11-17 DIAGNOSIS — I513 Intracardiac thrombosis, not elsewhere classified: Secondary | ICD-10-CM

## 2014-11-17 DIAGNOSIS — R4182 Altered mental status, unspecified: Secondary | ICD-10-CM | POA: Diagnosis present

## 2014-11-17 DIAGNOSIS — Z8673 Personal history of transient ischemic attack (TIA), and cerebral infarction without residual deficits: Secondary | ICD-10-CM | POA: Diagnosis present

## 2014-11-17 LAB — CBC WITH DIFFERENTIAL/PLATELET
BASOS PCT: 1 % (ref 0–1)
Basophils Absolute: 0 10*3/uL (ref 0.0–0.1)
Eosinophils Absolute: 0.2 10*3/uL (ref 0.0–0.7)
Eosinophils Relative: 4 % (ref 0–5)
HCT: 40.8 % (ref 39.0–52.0)
Hemoglobin: 13.9 g/dL (ref 13.0–17.0)
LYMPHS ABS: 2.4 10*3/uL (ref 0.7–4.0)
Lymphocytes Relative: 38 % (ref 12–46)
MCH: 27.8 pg (ref 26.0–34.0)
MCHC: 34.1 g/dL (ref 30.0–36.0)
MCV: 81.6 fL (ref 78.0–100.0)
Monocytes Absolute: 0.9 10*3/uL (ref 0.1–1.0)
Monocytes Relative: 14 % — ABNORMAL HIGH (ref 3–12)
Neutro Abs: 2.9 10*3/uL (ref 1.7–7.7)
Neutrophils Relative %: 45 % (ref 43–77)
Platelets: 287 10*3/uL (ref 150–400)
RBC: 5 MIL/uL (ref 4.22–5.81)
RDW: 14.3 % (ref 11.5–15.5)
WBC: 6.4 10*3/uL (ref 4.0–10.5)

## 2014-11-17 LAB — ABO/RH: ABO/RH(D): A POS

## 2014-11-17 LAB — PROTEIN AND GLUCOSE, CSF
Glucose, CSF: 53 mg/dL (ref 40–70)
Total  Protein, CSF: 30 mg/dL (ref 15–45)

## 2014-11-17 LAB — COMPREHENSIVE METABOLIC PANEL
ALBUMIN: 3.7 g/dL (ref 3.5–5.0)
ALT: 22 U/L (ref 17–63)
AST: 21 U/L (ref 15–41)
Alkaline Phosphatase: 47 U/L (ref 38–126)
Anion gap: 9 (ref 5–15)
BILIRUBIN TOTAL: 0.4 mg/dL (ref 0.3–1.2)
BUN: 25 mg/dL — AB (ref 6–20)
CO2: 23 mmol/L (ref 22–32)
Calcium: 9.1 mg/dL (ref 8.9–10.3)
Chloride: 104 mmol/L (ref 101–111)
Creatinine, Ser: 1.71 mg/dL — ABNORMAL HIGH (ref 0.61–1.24)
GFR calc non Af Amer: 52 mL/min — ABNORMAL LOW (ref >60)
Glucose, Bld: 96 mg/dL (ref 65–99)
Potassium: 3.8 mmol/L (ref 3.5–5.1)
Sodium: 136 mmol/L (ref 135–145)
Total Protein: 6.6 g/dL (ref 6.5–8.1)

## 2014-11-17 LAB — CBC
HCT: 42.7 % (ref 39.0–52.0)
Hemoglobin: 14.5 g/dL (ref 13.0–17.0)
MCH: 27.4 pg (ref 26.0–34.0)
MCHC: 34 g/dL (ref 30.0–36.0)
MCV: 80.7 fL (ref 78.0–100.0)
Platelets: 274 10*3/uL (ref 150–400)
RBC: 5.29 MIL/uL (ref 4.22–5.81)
RDW: 14 % (ref 11.5–15.5)
WBC: 6 10*3/uL (ref 4.0–10.5)

## 2014-11-17 LAB — CSF CELL COUNT WITH DIFFERENTIAL
RBC Count, CSF: 11 /mm3 — ABNORMAL HIGH
TUBE #: 3
WBC, CSF: 4 /mm3 (ref 0–5)

## 2014-11-17 LAB — CREATININE, SERUM
Creatinine, Ser: 1.74 mg/dL — ABNORMAL HIGH (ref 0.61–1.24)
GFR calc Af Amer: 60 mL/min — ABNORMAL LOW (ref >60)
GFR calc non Af Amer: 51 mL/min — ABNORMAL LOW (ref >60)

## 2014-11-17 LAB — CK: Total CK: 314 U/L (ref 49–397)

## 2014-11-17 LAB — TROPONIN I
TROPONIN I: 0.08 ng/mL — AB (ref <0.031)
Troponin I: 0.16 ng/mL — ABNORMAL HIGH (ref <0.031)
Troponin I: 0.27 ng/mL — ABNORMAL HIGH (ref <0.031)

## 2014-11-17 LAB — TYPE AND SCREEN
ABO/RH(D): A POS
ANTIBODY SCREEN: NEGATIVE

## 2014-11-17 LAB — GLUCOSE, CAPILLARY: Glucose-Capillary: 85 mg/dL (ref 65–99)

## 2014-11-17 LAB — ETHANOL

## 2014-11-17 LAB — RAPID URINE DRUG SCREEN, HOSP PERFORMED
Amphetamines: NOT DETECTED
BARBITURATES: NOT DETECTED
BENZODIAZEPINES: NOT DETECTED
COCAINE: NOT DETECTED
OPIATES: NOT DETECTED
TETRAHYDROCANNABINOL: POSITIVE — AB

## 2014-11-17 LAB — MRSA PCR SCREENING: MRSA by PCR: NEGATIVE

## 2014-11-17 LAB — I-STAT CG4 LACTIC ACID, ED: LACTIC ACID, VENOUS: 0.73 mmol/L (ref 0.5–2.0)

## 2014-11-17 LAB — AMMONIA: Ammonia: 25 umol/L (ref 9–35)

## 2014-11-17 MED ORDER — STROKE: EARLY STAGES OF RECOVERY BOOK
Freq: Once | Status: AC
  Administered 2014-11-17: 1
  Filled 2014-11-17 (×2): qty 1

## 2014-11-17 MED ORDER — ASPIRIN 300 MG RE SUPP
300.0000 mg | Freq: Every day | RECTAL | Status: DC
  Filled 2014-11-17: qty 1

## 2014-11-17 MED ORDER — LORAZEPAM 2 MG/ML IJ SOLN
1.0000 mg | Freq: Once | INTRAMUSCULAR | Status: AC
  Administered 2014-11-17: 1 mg via INTRAVENOUS
  Filled 2014-11-17 (×2): qty 1

## 2014-11-17 MED ORDER — ENOXAPARIN SODIUM 30 MG/0.3ML ~~LOC~~ SOLN
30.0000 mg | SUBCUTANEOUS | Status: DC
Start: 2014-11-17 — End: 2014-11-18
  Administered 2014-11-17: 30 mg via SUBCUTANEOUS
  Filled 2014-11-17 (×3): qty 0.3

## 2014-11-17 MED ORDER — ASPIRIN EC 81 MG PO TBEC: 81.0000 mg | DELAYED_RELEASE_TABLET | Freq: Every day | ORAL | Status: DC

## 2014-11-17 MED ORDER — IOHEXOL 300 MG/ML  SOLN
100.0000 mL | Freq: Once | INTRAMUSCULAR | Status: AC | PRN
  Administered 2014-11-17: 100 mL via INTRAVENOUS

## 2014-11-17 MED ORDER — SODIUM CHLORIDE 0.9 % IV SOLN
1000.0000 mL | INTRAVENOUS | Status: DC
  Administered 2014-11-17: 1000 mL via INTRAVENOUS

## 2014-11-17 MED ORDER — LORAZEPAM 2 MG/ML IJ SOLN
1.0000 mg | Freq: Four times a day (QID) | INTRAMUSCULAR | Status: DC | PRN
  Administered 2014-11-17 – 2014-11-18 (×2): 1 mg via INTRAVENOUS
  Filled 2014-11-17 (×3): qty 1

## 2014-11-17 MED ORDER — SODIUM CHLORIDE 0.9 % IV SOLN: INTRAVENOUS | Status: DC

## 2014-11-17 MED ORDER — SODIUM CHLORIDE 0.9 % IV SOLN
1000.0000 mL | Freq: Once | INTRAVENOUS | Status: AC
  Administered 2014-11-17: 1000 mL via INTRAVENOUS

## 2014-11-17 MED ORDER — PNEUMOCOCCAL VAC POLYVALENT 25 MCG/0.5ML IJ INJ
0.5000 mL | INJECTION | INTRAMUSCULAR | Status: DC
  Filled 2014-11-17 (×2): qty 0.5

## 2014-11-17 MED ORDER — DEXTROSE 5 % IV SOLN
750.0000 mg | Freq: Three times a day (TID) | INTRAVENOUS | Status: DC
  Administered 2014-11-17 – 2014-11-18 (×3): 750 mg via INTRAVENOUS
  Filled 2014-11-17 (×5): qty 15

## 2014-11-17 MED ORDER — ONDANSETRON HCL 4 MG/2ML IJ SOLN
4.0000 mg | Freq: Once | INTRAMUSCULAR | Status: AC
  Administered 2014-11-17: 4 mg via INTRAVENOUS
  Filled 2014-11-17: qty 2

## 2014-11-17 MED ORDER — LORAZEPAM 1 MG PO TABS
1.0000 mg | ORAL_TABLET | Freq: Once | ORAL | Status: AC
  Administered 2014-11-17: 1 mg via ORAL
  Filled 2014-11-17: qty 1

## 2014-11-17 MED ORDER — LORAZEPAM 2 MG/ML IJ SOLN
1.0000 mg | Freq: Once | INTRAMUSCULAR | Status: AC
  Administered 2014-11-17: 1 mg via INTRAVENOUS
  Filled 2014-11-17: qty 1

## 2014-11-17 MED ORDER — ASPIRIN EC 325 MG PO TBEC
325.0000 mg | DELAYED_RELEASE_TABLET | Freq: Every day | ORAL | Status: DC
  Administered 2014-11-19: 325 mg via ORAL
  Filled 2014-11-17 (×2): qty 1

## 2014-11-17 NOTE — ED Notes (Signed)
Lumbar puncture tray at bedside. °
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
## 2014-11-17 NOTE — ED Notes (Signed)
Pt attempting to climb out of bed; unable to understand speech. Pt given urinal; climbed out of bed and ambulated to corner of room to use urinal; assisted pt back to bed.

## 2014-11-17 NOTE — ED Notes (Addendum)
LP unsuccessful. MD will place order for IR.

## 2014-11-17 NOTE — Progress Notes (Signed)
Pt transferred from ED with RN. Pt sleepy but arousable. VSS, will continue to monitor. Family at bedside.

## 2014-11-17 NOTE — ED Notes (Signed)
Mom, Charis Cuozzo is going home and would like a call w/ results and if discharged.  [phone]
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
## 2014-11-17 NOTE — Procedures (Signed)
LP Procedure Note:  Patient has been seen and examined.  Chart has been reviewed.  LP is being performed due  to altered mental status.  Procedure has been explained to patient/family including risks and benefits.  Consent has been signed by patient/family and witnessed.   Blood pressure 155/87, pulse 85, temperature 98.5 F (36.9 C), resp. rate 21, height 5\' 11"  (1.803 m), SpO2 99 %.   Current facility-administered medications:  .  [COMPLETED] 0.9 %  sodium chloride infusion, 1,000 mL, Intravenous, Once, Stopped at 11/17/14 0505 **FOLLOWED BY** 0.9 %  sodium chloride infusion, 1,000 mL, Intravenous, Continuous, Ripudeep K Rai, MD .  LORazepam (ATIVAN) injection 1 mg, 1 mg, Intravenous, Once, Ramond Marrow, DO  Current outpatient prescriptions:  .  amLODipine (NORVASC) 10 MG tablet, Take 1 tablet (10 mg total) by mouth daily. (Patient taking differently: Take 10 mg by mouth every morning. ), Disp: 90 tablet, Rfl: 1 .  aspirin 81 MG tablet, Take 1 tablet (81 mg total) by mouth daily. (Patient taking differently: Take 81 mg by mouth every morning. ), Disp: 30 tablet, Rfl: 2 .  carvedilol (COREG) 25 MG tablet, Take 1 tablet (25 mg total) by mouth 2 (two) times daily with a meal., Disp: 60 tablet, Rfl: 2 .  furosemide (LASIX) 40 MG tablet, Take 0.5 tablets (20 mg total) by mouth daily. (Patient taking differently: Take 20 mg by mouth every morning. ), Disp: 30 tablet, Rfl: 6 .  hydrALAZINE (APRESOLINE) 50 MG tablet, Take 1 tablet (50 mg total) by mouth 3 (three) times daily., Disp: 90 tablet, Rfl: 2 .  HYDROcodone-acetaminophen (NORCO) 5-325 MG per tablet, Take 1 tablet by mouth every 6 (six) hours as needed for moderate pain., Disp: 30 tablet, Rfl: 0 .  isosorbide mononitrate (IMDUR) 60 MG 24 hr tablet, Take 1 tablet (60 mg total) by mouth 2 (two) times daily., Disp: 60 tablet, Rfl: 2 .  potassium chloride SA (K-DUR,KLOR-CON) 20 MEQ tablet, Take 0.5 tablets (10 mEq total) by mouth daily., Disp: 30  tablet, Rfl: 6 .  simvastatin (ZOCOR) 20 MG tablet, Take 1 tablet (20 mg total) by mouth daily. (Patient taking differently: Take 20 mg by mouth every morning. ), Disp: 30 tablet, Rfl: 2   Recent Labs  11/14/14 0945 11/17/14 0140  WBC  --  6.4  HGB 14.6 13.9  HCT 43.0 40.8  PLT  --  287    CT of head:  Patient was placed in the lateral decub/sitting position.  Area was cleaned with betadine and anesthetized with lidocaine.  Under sterile conditions 20G LP needle was placed at approximately L4-5 without success.  Will plan for LP under fluoroscopy.    Elspeth Cho, DO [facility] [phone]  If 7pm- 7am, please page neurology on call as listed in AMION.

## 2014-11-17 NOTE — ED Notes (Signed)
Pt resting at this time; mother at bedside.

## 2014-11-17 NOTE — ED Notes (Signed)
Hospitalist and Neurologist at bedside. 

## 2014-11-17 NOTE — Progress Notes (Signed)
Pt wants to leave AMA, paged DR, PA here w/Pt.

## 2014-11-17 NOTE — ED Notes (Signed)
Pt transported to x-ray with Irving Burton.
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
## 2014-11-17 NOTE — ED Notes (Signed)
MRI notified that pt is ready and has been medicated
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
## 2014-11-17 NOTE — Progress Notes (Signed)
Spoke with magistrate re: IVC paperwork.  Per Magistrate, pt cannot be admitted solely for medical concerns/no hx of MI.  RN Maggie informed.  Pt currently calm, family at bedside, per RN.

## 2014-11-17 NOTE — Progress Notes (Signed)
EEG completed, results pending. 

## 2014-11-17 NOTE — ED Notes (Signed)
EEG at bedside.

## 2014-11-17 NOTE — ED Notes (Signed)
Called pt mother to tell her that pt will be admitted. I informed mother that I could not give her MRI results over the phone, she understands.
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
## 2014-11-18 ENCOUNTER — Inpatient Hospital Stay (HOSPITAL_COMMUNITY): Payer: Medicaid Other

## 2014-11-18 DIAGNOSIS — I6789 Other cerebrovascular disease: Secondary | ICD-10-CM

## 2014-11-18 LAB — CBC
HEMATOCRIT: 41.7 % (ref 39.0–52.0)
HEMOGLOBIN: 14.5 g/dL (ref 13.0–17.0)
MCH: 28.2 pg (ref 26.0–34.0)
MCHC: 34.8 g/dL (ref 30.0–36.0)
MCV: 81.1 fL (ref 78.0–100.0)
Platelets: 240 10*3/uL (ref 150–400)
RBC: 5.14 MIL/uL (ref 4.22–5.81)
RDW: 14.1 % (ref 11.5–15.5)
WBC: 5.9 10*3/uL (ref 4.0–10.5)

## 2014-11-18 LAB — BASIC METABOLIC PANEL
Anion gap: 10 (ref 5–15)
BUN: 17 mg/dL (ref 6–20)
CALCIUM: 8.9 mg/dL (ref 8.9–10.3)
CHLORIDE: 104 mmol/L (ref 101–111)
CO2: 23 mmol/L (ref 22–32)
CREATININE: 1.64 mg/dL — AB (ref 0.61–1.24)
GFR calc non Af Amer: 55 mL/min — ABNORMAL LOW (ref >60)
Glucose, Bld: 86 mg/dL (ref 65–99)
POTASSIUM: 3.2 mmol/L — AB (ref 3.5–5.1)
SODIUM: 137 mmol/L (ref 135–145)

## 2014-11-18 LAB — LIPID PANEL
CHOLESTEROL: 174 mg/dL (ref 0–200)
HDL: 33 mg/dL — ABNORMAL LOW (ref >40)
LDL CALC: 114 mg/dL — AB (ref 0–99)
Total CHOL/HDL Ratio: 5.3 RATIO
Triglycerides: 136 mg/dL (ref <150)
VLDL: 27 mg/dL (ref 0–40)

## 2014-11-18 LAB — GLUCOSE, CAPILLARY
Glucose-Capillary: 75 mg/dL (ref 65–99)
Glucose-Capillary: 104 mg/dL — ABNORMAL HIGH (ref 65–99)
Glucose-Capillary: 109 mg/dL — ABNORMAL HIGH (ref 65–99)

## 2014-11-18 LAB — HIV ANTIBODY (ROUTINE TESTING W REFLEX): HIV Screen 4th Generation wRfx: NONREACTIVE

## 2014-11-18 LAB — TSH: TSH: 1.127 u[IU]/mL (ref 0.350–4.500)

## 2014-11-18 LAB — VITAMIN B12: Vitamin B-12: 362 pg/mL (ref 180–914)

## 2014-11-18 LAB — HEMOGLOBIN A1C
Hgb A1c MFr Bld: 5.5 % (ref 4.8–5.6)
Mean Plasma Glucose: 111 mg/dL

## 2014-11-18 LAB — TROPONIN I: TROPONIN I: 0.37 ng/mL — AB (ref <0.031)

## 2014-11-18 MED ORDER — POTASSIUM CHLORIDE 10 MEQ/100ML IV SOLN
10.0000 meq | INTRAVENOUS | Status: AC
  Administered 2014-11-18 (×3): 10 meq via INTRAVENOUS
  Filled 2014-11-18 (×3): qty 100

## 2014-11-18 MED ORDER — PROMETHAZINE HCL 25 MG/ML IJ SOLN: 12.5000 mg | Freq: Four times a day (QID) | INTRAMUSCULAR | Status: DC | PRN

## 2014-11-18 MED ORDER — CYANOCOBALAMIN 1000 MCG/ML IJ SOLN
1000.0000 ug | Freq: Every day | INTRAMUSCULAR | Status: AC
  Administered 2014-11-18 – 2014-11-20 (×3): 1000 ug via SUBCUTANEOUS
  Filled 2014-11-18 (×3): qty 1

## 2014-11-18 MED ORDER — SODIUM CHLORIDE 0.9 % IV SOLN
INTRAVENOUS | Status: DC
  Administered 2014-11-18 – 2014-11-23 (×3): via INTRAVENOUS

## 2014-11-18 MED ORDER — HEPARIN (PORCINE) IN NACL 100-0.45 UNIT/ML-% IJ SOLN
1400.0000 [IU]/h | INTRAMUSCULAR | Status: DC
  Administered 2014-11-18: 1200 [IU]/h via INTRAVENOUS
  Administered 2014-11-19 – 2014-11-20 (×3): 1550 [IU]/h via INTRAVENOUS
  Administered 2014-11-21 – 2014-11-22 (×2): 1450 [IU]/h via INTRAVENOUS
  Administered 2014-11-23: 1400 [IU]/h via INTRAVENOUS
  Administered 2014-11-23: 1100 [IU]/h via INTRAVENOUS
  Administered 2014-11-24 – 2014-11-25 (×2): 1400 [IU]/h via INTRAVENOUS
  Filled 2014-11-18 (×10): qty 250

## 2014-11-18 MED ORDER — LORAZEPAM 2 MG/ML IJ SOLN
1.0000 mg | INTRAMUSCULAR | Status: DC | PRN
Start: 2014-11-18 — End: 2014-11-20
  Administered 2014-11-18 – 2014-11-19 (×2): 1 mg via INTRAVENOUS
  Filled 2014-11-18 (×2): qty 1

## 2014-11-18 MED ORDER — ONDANSETRON HCL 4 MG/2ML IJ SOLN
4.0000 mg | Freq: Four times a day (QID) | INTRAMUSCULAR | Status: DC | PRN
  Administered 2014-11-18: 4 mg via INTRAVENOUS
  Filled 2014-11-18: qty 2

## 2014-11-18 NOTE — Progress Notes (Signed)
PT Cancellation Note  Patient Details Name: Shawn [name] MRN: [id number] DOB: [birth year]   Cancelled Treatment:    Reason Eval/Treat Not Completed: Patient not medically ready (troponins trending up still).  Will monitor for downward trend of troponins and initiate PT evaluation at that time.   Olivia Canter 11/18/2014, 9:55 AM  11/18/2014 Corlis Hove, PT [phone]

## 2014-11-18 NOTE — Progress Notes (Signed)
Patient refuses to keep on tele monitor. Will continue to monitor.
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
## 2014-11-18 NOTE — Progress Notes (Signed)
  Echocardiogram 2D Echocardiogram has been performed.  Delcie Roch 11/18/2014, 3:36 PM
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
## 2014-11-19 DIAGNOSIS — I213 ST elevation (STEMI) myocardial infarction of unspecified site: Secondary | ICD-10-CM

## 2014-11-19 DIAGNOSIS — I5042 Chronic combined systolic (congestive) and diastolic (congestive) heart failure: Secondary | ICD-10-CM

## 2014-11-19 DIAGNOSIS — I429 Cardiomyopathy, unspecified: Secondary | ICD-10-CM

## 2014-11-19 LAB — CBC
HCT: 41.9 % (ref 39.0–52.0)
Hemoglobin: 14.4 g/dL (ref 13.0–17.0)
MCH: 27.5 pg (ref 26.0–34.0)
MCHC: 34.4 g/dL (ref 30.0–36.0)
MCV: 80.1 fL (ref 78.0–100.0)
PLATELETS: 260 10*3/uL (ref 150–400)
RBC: 5.23 MIL/uL (ref 4.22–5.81)
RDW: 14 % (ref 11.5–15.5)
WBC: 6.7 10*3/uL (ref 4.0–10.5)

## 2014-11-19 LAB — COMPREHENSIVE METABOLIC PANEL
ALT: 21 U/L (ref 17–63)
AST: 18 U/L (ref 15–41)
Albumin: 3.2 g/dL — ABNORMAL LOW (ref 3.5–5.0)
Alkaline Phosphatase: 47 U/L (ref 38–126)
Anion gap: 6 (ref 5–15)
BILIRUBIN TOTAL: 0.6 mg/dL (ref 0.3–1.2)
BUN: 14 mg/dL (ref 6–20)
CALCIUM: 8.8 mg/dL — AB (ref 8.9–10.3)
CO2: 27 mmol/L (ref 22–32)
Chloride: 105 mmol/L (ref 101–111)
Creatinine, Ser: 1.68 mg/dL — ABNORMAL HIGH (ref 0.61–1.24)
GFR calc Af Amer: >60 mL/min (ref >60)
GFR calc non Af Amer: 54 mL/min — ABNORMAL LOW (ref >60)
Glucose, Bld: 96 mg/dL (ref 65–99)
Potassium: 3.3 mmol/L — ABNORMAL LOW (ref 3.5–5.1)
SODIUM: 138 mmol/L (ref 135–145)
Total Protein: 6.6 g/dL (ref 6.5–8.1)

## 2014-11-19 LAB — HEPARIN LEVEL (UNFRACTIONATED)
HEPARIN UNFRACTIONATED: 0.43 [IU]/mL (ref 0.30–0.70)
Heparin Unfractionated: 0.15 IU/mL — ABNORMAL LOW (ref 0.30–0.70)
Heparin Unfractionated: 0.46 IU/mL (ref 0.30–0.70)

## 2014-11-19 LAB — HERPES SIMPLEX VIRUS(HSV) DNA BY PCR
HSV 1 DNA: NEGATIVE
HSV 2 DNA: NEGATIVE

## 2014-11-19 LAB — GLUCOSE, CAPILLARY: Glucose-Capillary: 87 mg/dL (ref 65–99)

## 2014-11-19 MED ORDER — HEPARIN BOLUS VIA INFUSION
3000.0000 [IU] | Freq: Once | INTRAVENOUS | Status: DC
  Filled 2014-11-19: qty 3000

## 2014-11-19 MED ORDER — CARVEDILOL 6.25 MG PO TABS
6.2500 mg | ORAL_TABLET | Freq: Two times a day (BID) | ORAL | Status: DC
  Administered 2014-11-20 – 2014-11-25 (×8): 6.25 mg via ORAL
  Filled 2014-11-19 (×12): qty 1

## 2014-11-19 MED ORDER — WARFARIN - PHARMACIST DOSING INPATIENT
Freq: Every day | Status: DC
  Administered 2014-11-20: 18:00:00

## 2014-11-19 MED ORDER — WARFARIN SODIUM 10 MG PO TABS
10.0000 mg | ORAL_TABLET | Freq: Once | ORAL | Status: AC
  Administered 2014-11-19: 10 mg via ORAL
  Filled 2014-11-19: qty 1

## 2014-11-19 MED ORDER — SIMVASTATIN 20 MG PO TABS
20.0000 mg | ORAL_TABLET | Freq: Every day | ORAL | Status: DC
  Administered 2014-11-19 – 2014-11-25 (×7): 20 mg via ORAL
  Filled 2014-11-19 (×7): qty 1

## 2014-11-19 MED ORDER — POTASSIUM CHLORIDE CRYS ER 20 MEQ PO TBCR
40.0000 meq | EXTENDED_RELEASE_TABLET | Freq: Once | ORAL | Status: AC
  Administered 2014-11-19: 40 meq via ORAL
  Filled 2014-11-19: qty 2

## 2014-11-19 NOTE — Progress Notes (Addendum)
PT Cancellation Note  Patient Details Name: Shawn [name] MRN: [id number] DOB: 27-Oct-1985   Cancelled Treatment:    Reason Eval/Treat Not Completed: Patient not medically ready. Pt found to have large L ventricle thrombosis and is now on heparin drip. Awaiting to see if troponins are still elevated. PT to return as able when pt medically stable.   Marcene Brawn 11/19/2014, 7:09 AM  Lewis Shock, PT, DPT Pager #: [phone] Office #: [phone]

## 2014-11-19 NOTE — Progress Notes (Signed)
Utilization Review Completed.Shawn [name] T7/07/2014  
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
## 2014-11-19 NOTE — Progress Notes (Addendum)
ANTICOAGULATION CONSULT NOTE - Follow Up Consult  Pharmacy Consult for Heparin Indication: LV thrombus, new CVA  No Known Allergies  Patient Measurements: Height: 5\' 11"  (180.3 cm) Weight: 271 lb (122.925 kg) IBW/kg (Calculated) : 75.3 Heparin Dosing Weight: 102 kg  Vital Signs: Temp: 98.9 F (37.2 C) (07/03 1156) Temp Source: Oral (07/03 1156) BP: 148/94 mmHg (07/03 1156) Pulse Rate: 72 (07/03 1156)  Labs:  Recent Labs  11/17/14 0831 11/17/14 1420 11/17/14 1913 11/18/14 0133 11/19/14 0024 11/19/14 0736 11/19/14 1343  HGB  --  14.5  --  14.5 14.4  --   --   HCT  --  42.7  --  41.7 41.9  --   --   PLT  --  274  --  240 260  --   --   HEPARINUNFRC  --   --   --   --  0.15* 0.43 0.46  CREATININE  --  1.74*  --  1.64* 1.68*  --   --   CKTOTAL 314  --   --   --   --   --   --   TROPONINI 0.08* 0.16* 0.27* 0.37*  --   --   --     Estimated Creatinine Clearance: 86.5 mL/min (by C-G formula based on Cr of 1.68).   Assessment:  Anticoag: Change lovenox to IV heparin due to large LV thrombus causing CVA. HL 0.43 now in goal. CBC stable. Repeat HL 0.46 remains in goal  Goal of Therapy:  HL 0.3-0.5 Monitor platelets by anticoagulation protocol: Yes   Plan:  Continue heparin at 1550 units/hr Reconfirm level in 6 hrs.   Crystal S. Merilynn Finland, PharmD, BCPS Clinical Staff Pharmacist Pager [phone]  Misty Stanley Stillinger 11/19/2014,3:20 PM  Addendum:  Asked to start warfarin - Will give 10mg  po x 1 dose.   Daily protimes.  Celedonio Miyamoto, PharmD, BCPS-AQ ID Clinical Pharmacist Pager [phone]
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
## 2014-11-19 NOTE — Progress Notes (Signed)
ANTICOAGULATION CONSULT NOTE - Follow Up Consult  Pharmacy Consult for Heparin Indication: LV thrombus, new CVA  No Known Allergies  Patient Measurements: Height: 5\' 11"  (180.3 cm) Weight: 271 lb (122.925 kg) IBW/kg (Calculated) : 75.3 Heparin Dosing Weight: 102 kg  Vital Signs: Temp: 98.8 F (37.1 C) (07/03 0733) Temp Source: Oral (07/03 0733) BP: 151/90 mmHg (07/03 0733) Pulse Rate: 68 (07/03 0733)  Labs:  Recent Labs  11/17/14 0831 11/17/14 1420 11/17/14 1913 11/18/14 0133 11/19/14 0024 11/19/14 0736  HGB  --  14.5  --  14.5 14.4  --   HCT  --  42.7  --  41.7 41.9  --   PLT  --  274  --  240 260  --   HEPARINUNFRC  --   --   --   --  0.15* 0.43  CREATININE  --  1.74*  --  1.64* 1.68*  --   CKTOTAL 314  --   --   --   --   --   TROPONINI 0.08* 0.16* 0.27* 0.37*  --   --     Estimated Creatinine Clearance: 86.5 mL/min (by C-G formula based on Cr of 1.68).   Assessment:  Anticoag: Change lovenox to IV heparin due to large LV thrombus causing CVA. HL 0.43 now in goal. CBC stable.  Goal of Therapy:  HL 0.3-0.5 Monitor platelets by anticoagulation protocol: Yes   Plan:  Continue heparin at 1550 units/hr Reconfirm level in 6 hrs.   Joda Braatz S. Merilynn Finland, PharmD, BCPS Clinical Staff Pharmacist Pager [phone]  Misty Stanley Stillinger 11/19/2014,9:16 AM
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
## 2014-11-20 DIAGNOSIS — I63132 Cerebral infarction due to embolism of left carotid artery: Secondary | ICD-10-CM

## 2014-11-20 DIAGNOSIS — R4701 Aphasia: Secondary | ICD-10-CM | POA: Diagnosis present

## 2014-11-20 LAB — BASIC METABOLIC PANEL
ANION GAP: 8 (ref 5–15)
BUN: 11 mg/dL (ref 6–20)
CO2: 25 mmol/L (ref 22–32)
Calcium: 8.6 mg/dL — ABNORMAL LOW (ref 8.9–10.3)
Chloride: 103 mmol/L (ref 101–111)
Creatinine, Ser: 1.6 mg/dL — ABNORMAL HIGH (ref 0.61–1.24)
GFR calc Af Amer: >60 mL/min (ref >60)
GFR calc non Af Amer: 57 mL/min — ABNORMAL LOW (ref >60)
Glucose, Bld: 88 mg/dL (ref 65–99)
Potassium: 3.6 mmol/L (ref 3.5–5.1)
SODIUM: 136 mmol/L (ref 135–145)

## 2014-11-20 LAB — CSF CULTURE W GRAM STAIN

## 2014-11-20 LAB — CBC
HEMATOCRIT: 40.4 % (ref 39.0–52.0)
Hemoglobin: 13.8 g/dL (ref 13.0–17.0)
MCH: 27.3 pg (ref 26.0–34.0)
MCHC: 34.2 g/dL (ref 30.0–36.0)
MCV: 80 fL (ref 78.0–100.0)
PLATELETS: 252 10*3/uL (ref 150–400)
RBC: 5.05 MIL/uL (ref 4.22–5.81)
RDW: 14.1 % (ref 11.5–15.5)
WBC: 5.6 10*3/uL (ref 4.0–10.5)

## 2014-11-20 LAB — MAGNESIUM: MAGNESIUM: 1.7 mg/dL (ref 1.7–2.4)

## 2014-11-20 LAB — HEPARIN LEVEL (UNFRACTIONATED): Heparin Unfractionated: 0.47 IU/mL (ref 0.30–0.70)

## 2014-11-20 LAB — CSF CULTURE: CULTURE: NO GROWTH

## 2014-11-20 LAB — PROTIME-INR
INR: 1.16 (ref 0.00–1.49)
PROTHROMBIN TIME: 15 s (ref 11.6–15.2)

## 2014-11-20 MED ORDER — POTASSIUM CHLORIDE CRYS ER 20 MEQ PO TBCR
40.0000 meq | EXTENDED_RELEASE_TABLET | Freq: Once | ORAL | Status: AC
  Administered 2014-11-20: 40 meq via ORAL
  Filled 2014-11-20: qty 2

## 2014-11-20 MED ORDER — WARFARIN SODIUM 10 MG PO TABS
10.0000 mg | ORAL_TABLET | Freq: Once | ORAL | Status: AC
  Administered 2014-11-20: 10 mg via ORAL
  Filled 2014-11-20: qty 1

## 2014-11-20 MED ORDER — FUROSEMIDE 40 MG PO TABS
40.0000 mg | ORAL_TABLET | Freq: Every day | ORAL | Status: DC
  Administered 2014-11-20 – 2014-11-25 (×6): 40 mg via ORAL
  Filled 2014-11-20 (×6): qty 1
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
## 2014-11-20 NOTE — Progress Notes (Signed)
Pt arrived  On unit 1945 hrs, CAO, no C/O, transfer orders implemented, Pt oriented to unit/equipment.

## 2014-11-20 NOTE — Progress Notes (Signed)
1900  Pt transferred to 4 North bed 05, accomp by nurse tech. Report given to RN.
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
## 2014-11-20 NOTE — Progress Notes (Signed)
ANTICOAGULATION CONSULT NOTE - Follow Up Consult  Pharmacy Consult for Heparin / Coumadin Indication: LV thrombus, new CVA  No Known Allergies  Patient Measurements: Height: 5\' 11"  (180.3 cm) Weight: 271 lb (122.925 kg) IBW/kg (Calculated) : 75.3 Heparin Dosing Weight: 102 kg  Vital Signs: Temp: 98.3 F (36.8 C) (07/04 0252) Temp Source: Oral (07/04 0746) BP: 145/89 mmHg (07/04 0746)  Labs:  Recent Labs  11/17/14 1420 11/17/14 1913 11/18/14 0133  11/19/14 0024 11/19/14 0736 11/19/14 1343 11/20/14 0256  HGB 14.5  --  14.5  --  14.4  --   --  13.8  HCT 42.7  --  41.7  --  41.9  --   --  40.4  PLT 274  --  240  --  260  --   --  252  LABPROT  --   --   --   --   --   --   --  15.0  INR  --   --   --   --   --   --   --  1.16  HEPARINUNFRC  --   --   --   < > 0.15* 0.43 0.46 0.47  CREATININE 1.74*  --  1.64*  --  1.68*  --   --  1.60*  TROPONINI 0.16* 0.27* 0.37*  --   --   --   --   --   < > = values in this interval not displayed.  Estimated Creatinine Clearance: 90.9 mL/min (by C-G formula based on Cr of 1.6).   Assessment:  Anticoag: Change lovenox to IV heparin due to large LV thrombus causing CVA.  Heparin level at goal  Goal of Therapy:  HL 0.3-0.5 Monitor platelets by anticoagulation protocol: Yes  INR = 2 to 3   Plan:  Continue heparin at 1550 units/hr Repeat Coumadin 10 mg po x 1  Thank you. Okey Regal, PharmD [phone]   11/20/2014,12:50 PM
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
## 2014-11-21 DIAGNOSIS — I240 Acute coronary thrombosis not resulting in myocardial infarction: Secondary | ICD-10-CM | POA: Diagnosis present

## 2014-11-21 DIAGNOSIS — N183 Chronic kidney disease, stage 3 (moderate): Secondary | ICD-10-CM

## 2014-11-21 DIAGNOSIS — I513 Intracardiac thrombosis, not elsewhere classified: Secondary | ICD-10-CM

## 2014-11-21 DIAGNOSIS — I639 Cerebral infarction, unspecified: Secondary | ICD-10-CM

## 2014-11-21 DIAGNOSIS — I6340 Cerebral infarction due to embolism of unspecified cerebral artery: Principal | ICD-10-CM

## 2014-11-21 LAB — CBC
HCT: 42.7 % (ref 39.0–52.0)
HEMOGLOBIN: 15.1 g/dL (ref 13.0–17.0)
MCH: 28.2 pg (ref 26.0–34.0)
MCHC: 35.4 g/dL (ref 30.0–36.0)
MCV: 79.7 fL (ref 78.0–100.0)
Platelets: 225 10*3/uL (ref 150–400)
RBC: 5.36 MIL/uL (ref 4.22–5.81)
RDW: 13.9 % (ref 11.5–15.5)
WBC: 5.2 10*3/uL (ref 4.0–10.5)

## 2014-11-21 LAB — HEPARIN LEVEL (UNFRACTIONATED): Heparin Unfractionated: 0.56 IU/mL (ref 0.30–0.70)

## 2014-11-21 LAB — PROTIME-INR
INR: 1.26 (ref 0.00–1.49)
Prothrombin Time: 15.9 seconds — ABNORMAL HIGH (ref 11.6–15.2)

## 2014-11-21 MED ORDER — WARFARIN SODIUM 5 MG PO TABS
10.0000 mg | ORAL_TABLET | Freq: Once | ORAL | Status: AC
  Administered 2014-11-21: 10 mg via ORAL
  Filled 2014-11-21: qty 2

## 2014-11-21 MED ORDER — HYDROCODONE-ACETAMINOPHEN 5-325 MG PO TABS
1.0000 | ORAL_TABLET | ORAL | Status: DC | PRN
  Administered 2014-11-23: 2 via ORAL
  Administered 2014-11-24 (×2): 1 via ORAL
  Filled 2014-11-21: qty 2
  Filled 2014-11-21 (×2): qty 1

## 2014-11-21 MED ORDER — HYDROCODONE-ACETAMINOPHEN 5-325 MG PO TABS
2.0000 | ORAL_TABLET | Freq: Once | ORAL | Status: DC
  Filled 2014-11-21: qty 2
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
## 2014-11-21 NOTE — Progress Notes (Signed)
ANTICOAGULATION CONSULT NOTE - Follow Up Consult  Pharmacy Consult for Heparin / Coumadin Indication: LV thrombus, new CVA  No Known Allergies  Patient Measurements: Height: 5\' 11"  (180.3 cm) Weight: 268 lb 8.3 oz (121.8 kg) IBW/kg (Calculated) : 75.3 Heparin Dosing Weight: 102 kg  Vital Signs: Temp: 98 F (36.7 C) (07/05 0545) Temp Source: Oral (07/05 0545) BP: 144/91 mmHg (07/05 0545) Pulse Rate: 71 (07/05 0545)  Labs:  Recent Labs  11/19/14 0024  11/19/14 1343 11/20/14 0256 11/21/14 0605  HGB 14.4  --   --  13.8 15.1  HCT 41.9  --   --  40.4 42.7  PLT 260  --   --  252 225  LABPROT  --   --   --  15.0 15.9*  INR  --   --   --  1.16 1.26  HEPARINUNFRC 0.15*  < > 0.46 0.47 0.56  CREATININE 1.68*  --   --  1.60*  --   < > = values in this interval not displayed.  Estimated Creatinine Clearance: 90.5 mL/min (by C-G formula based on Cr of 1.6).   Assessment: 28 yom continuing on heparin bridge + warfarin for large LV thrombus causing CVA until INR is therapeutic (no anticoag pta). HL 0.56 @1550  units/h - now slightly above HL goal 0.3-0.5. Will decrease slightly to keep in range. No bleed per RN. INR 1.16>>1.26, still subtherapeutic. Given repeat dose of warfarin 10mg  last night.  Goal of Therapy:  HL 0.3-0.5 Monitor platelets by anticoagulation protocol: Yes  INR = 2 to 3   Plan:  Decrease heparin slightly to 1450 units/hr  Repeat Warfarin 10mg  po x 1 dose  D/c heparin when INR therapeutic x2  Daily HL/CBC/INR  Mon s/sx bleed  Babs Bertin, PharmD Clinical Pharmacist - Resident Pager [phone] 11/21/2014 8:44 AM

## 2014-11-22 ENCOUNTER — Telehealth: Payer: Self-pay | Admitting: Cardiovascular Disease

## 2014-11-22 ENCOUNTER — Ambulatory Visit (HOSPITAL_COMMUNITY): Admission: RE | Admit: 2014-11-22 | Payer: MEDICAID | Source: Ambulatory Visit

## 2014-11-22 DIAGNOSIS — E669 Obesity, unspecified: Secondary | ICD-10-CM

## 2014-11-22 DIAGNOSIS — R4701 Aphasia: Secondary | ICD-10-CM

## 2014-11-22 LAB — HEPARIN LEVEL (UNFRACTIONATED)
HEPARIN UNFRACTIONATED: 0.82 [IU]/mL — AB (ref 0.30–0.70)
HEPARIN UNFRACTIONATED: 0.6 [IU]/mL (ref 0.30–0.70)
Heparin Unfractionated: 0.3 IU/mL (ref 0.30–0.70)

## 2014-11-22 LAB — CBC
HEMATOCRIT: 42.4 % (ref 39.0–52.0)
HEMOGLOBIN: 14.5 g/dL (ref 13.0–17.0)
MCH: 27.6 pg (ref 26.0–34.0)
MCHC: 34.2 g/dL (ref 30.0–36.0)
MCV: 80.6 fL (ref 78.0–100.0)
Platelets: 252 10*3/uL (ref 150–400)
RBC: 5.26 MIL/uL (ref 4.22–5.81)
RDW: 14.2 % (ref 11.5–15.5)
WBC: 5.8 10*3/uL (ref 4.0–10.5)

## 2014-11-22 LAB — PROTIME-INR
INR: 1.48 (ref 0.00–1.49)
Prothrombin Time: 17.9 seconds — ABNORMAL HIGH (ref 11.6–15.2)

## 2014-11-22 MED ORDER — MAGNESIUM SULFATE 2 GM/50ML IV SOLN
2.0000 g | Freq: Once | INTRAVENOUS | Status: AC
  Administered 2014-11-22: 2 g via INTRAVENOUS
  Filled 2014-11-22: qty 50

## 2014-11-22 MED ORDER — FUROSEMIDE 10 MG/ML IJ SOLN
40.0000 mg | Freq: Once | INTRAMUSCULAR | Status: AC
  Administered 2014-11-22: 40 mg via INTRAVENOUS
  Filled 2014-11-22: qty 4

## 2014-11-22 MED ORDER — WARFARIN SODIUM 5 MG PO TABS
10.0000 mg | ORAL_TABLET | Freq: Once | ORAL | Status: AC
  Administered 2014-11-22: 10 mg via ORAL
  Filled 2014-11-22: qty 2

## 2014-11-22 MED ORDER — POTASSIUM CHLORIDE CRYS ER 20 MEQ PO TBCR
60.0000 meq | EXTENDED_RELEASE_TABLET | Freq: Once | ORAL | Status: AC
  Administered 2014-11-22: 60 meq via ORAL
  Filled 2014-11-22: qty 3

## 2014-11-22 NOTE — Progress Notes (Signed)
INR still subtherapeutic. Continue heparin until INR > 2.0. Please call if we can help further.

## 2014-11-22 NOTE — Telephone Encounter (Signed)
I saw him in the hospital, thanks
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
## 2014-11-22 NOTE — Progress Notes (Signed)
ANTICOAGULATION CONSULT NOTE - Follow Up Consult  Pharmacy Consult for Heparin  Indication: stroke, LV thrombus  No Known Allergies  Patient Measurements: Height: 5\' 11"  (180.3 cm) Weight: 269 lb 13.5 oz (122.4 kg) IBW/kg (Calculated) : 75.3   Vital Signs: Temp: 98.5 F (36.9 C) (07/06 2209) Temp Source: Oral (07/06 2209) BP: 153/99 mmHg (07/06 2209) Pulse Rate: 82 (07/06 2209)  Labs:  Recent Labs  11/20/14 0256 11/21/14 0605 11/22/14 0536 11/22/14 1400 11/22/14 2311  HGB 13.8 15.1 14.5  --   --   HCT 40.4 42.7 42.4  --   --   PLT 252 225 252  --   --   LABPROT 15.0 15.9* 17.9*  --   --   INR 1.16 1.26 1.48  --   --   HEPARINUNFRC 0.47 0.56 0.82* 0.60 0.30  CREATININE 1.60*  --   --   --   --     Estimated Creatinine Clearance: 90.7 mL/min (by C-G formula based on Cr of 1.6).   Assessment: Therapeutic heparin level after rate decrease (note low goal of 0.3-0.5), other labs as above.   Goal of Therapy:  Heparin level 0.3-0.5 units/ml Monitor platelets by anticoagulation protocol: Yes   Plan:  -Continue heparin at 1100 units/hr -Check HL with AM labs -Daily CBC/HL -Monitor for bleeding  Abran Duke 11/22/2014,11:45 PM

## 2014-11-22 NOTE — Progress Notes (Signed)
ANTICOAGULATION CONSULT NOTE - Follow Up Consult  Pharmacy Consult for Heparin / Coumadin Indication: LV thrombus, new CVA  No Known Allergies  Patient Measurements: Height: 5\' 11"  (180.3 cm) Weight: 269 lb 13.5 oz (122.4 kg) IBW/kg (Calculated) : 75.3 Heparin Dosing Weight: 102 kg  Vital Signs: Temp: 98.6 F (37 C) (07/06 0946) Temp Source: Oral (07/06 0946) BP: 145/84 mmHg (07/06 0946) Pulse Rate: 79 (07/06 0946)  Labs:  Recent Labs  11/20/14 0256 11/21/14 0605 11/22/14 0536 11/22/14 1400  HGB 13.8 15.1 14.5  --   HCT 40.4 42.7 42.4  --   PLT 252 225 252  --   LABPROT 15.0 15.9* 17.9*  --   INR 1.16 1.26 1.48  --   HEPARINUNFRC 0.47 0.56 0.82* 0.60  CREATININE 1.60*  --   --   --     Estimated Creatinine Clearance: 90.7 mL/min (by C-G formula based on Cr of 1.6).   Assessment: 28 yom continuing on heparin bridge + warfarin for large LV thrombus causing CVA until INR is therapeutic (no anticoag pta). HL 0.6 on 1200 units/hr which is slightly above goal. No bleed noted. INR remains subtherapeutic but is trending up steadily.   Goal of Therapy:  HL 0.3-0.5 Monitor platelets by anticoagulation protocol: Yes  INR = 2 to 3   Plan:  - Decrease heparin gtt to 1100 units/hr - Check an 8 hour heparin level - Warfarin 10mg  PO x 1 tonight - Daily heparin level, INR and CBC  Lysle Pearl, PharmD, BCPS Pager # [phone] 11/22/2014 2:49 PM

## 2014-11-22 NOTE — Progress Notes (Signed)
ANTICOAGULATION CONSULT NOTE - Follow Up Consult  Pharmacy Consult for heparin Indication: LV thrombus w/ CVA   Labs:  Recent Labs  11/20/14 0256 11/21/14 0605 11/22/14 0536  HGB 13.8 15.1 14.5  HCT 40.4 42.7 42.4  PLT 252 225 252  LABPROT 15.0 15.9* 17.9*  INR 1.16 1.26 1.48  HEPARINUNFRC 0.47 0.56 0.82*  CREATININE 1.60*  --   --     Assessment: 28yo male now well above goal despite rate decrease yesterday.  Goal of Therapy:  Heparin level 0.3-0.5 units/ml   Plan:  Will decrease heparin gtt by 2 units/kg/hr to 1200 units/hr and check level in 6hr.  Vernard Gambles, PharmD, BCPS  11/22/2014,6:56 AM
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
## 2014-11-22 NOTE — Hospital Discharge Follow-Up (Signed)
Transitional Care Clinic Care Coordination Note:  Admit date: 11/17/14   Discharge date: TBD Discharge Disposition: Home with family Patient contact: [phone] (mobile) Emergency contact(s):  [phone] Rise Paganini Kirks-mother)  This Case Manager reviewed patient's EMR and determined patient would benefit from post-discharge medical management and chronic care management services through the [facility]. Patient has a history of acute systolic heart failure, hyperlipidemia, chronic kidney disease Stage 3, malignant HTN. Patient receiving treament for multifocal embolic left brain CVA, large LV thrombus, acute encephalopathy. Warfarin therapy initiated. Patient will be on IV heparin until warfarin fully therapeutic. This Case Manager met with patient to discuss the services and medical management that can be provided at the [facility]. Patient verbalized understanding and agreed to receive post-discharge care at the [facility].   Patient scheduled for Transitional Care appointment on 11/27/14 at 1100 with Dr. Doreene Burke.  Clinic information and appointment time provided to patient. Appointment information also placed on AVS.  Assessment:       Home Environment: Patient lives in a one level home with family.       Support System: Parents, siblings       Level of functioning: independent with daily activities       Home DME: none       Home care services: none       Transportation: Patient's mother indicates a family member will provide transportation to patient's [facility] appointments.        Food/Nutrition: Patient lives with parents and indicates he does not have difficulty accessing food.        Medications: Patient indicates he uses the [facility] and is able to obtain needed medications there.  Thoroughly discussed the pharmacy resources available at [facility] and [place] and  informed him that there are programs available to ensure he gets needed medications. Patient verbalized understanding.        Identified Barriers: uninsured-Patient will benefit from Financial Counselor appointment. Discussed available pharmacy resources available at [place]. Patient to discharge on Warfarin. Will need INR closely monitored after discharge. Patient and patient's mother uncertain if Cardiology will manage Coumadin.  If not, both aware that [place] has Coumadin clinic.  Lorne Skeens, RN CM updated.        PCP: Dr. Lenard [facility] [place]  Patient Education: [facility] pharmacy resources. Patient will also need to meet with Financial Counselor at clinic. He was educated about the close follow-up provided by [facility]. Patient appreciative of information.            Arranged services:        Services communicated to [employer], CM

## 2014-11-23 LAB — BASIC METABOLIC PANEL
ANION GAP: 11 (ref 5–15)
BUN: 15 mg/dL (ref 6–20)
CALCIUM: 9.2 mg/dL (ref 8.9–10.3)
CO2: 24 mmol/L (ref 22–32)
Chloride: 101 mmol/L (ref 101–111)
Creatinine, Ser: 1.76 mg/dL — ABNORMAL HIGH (ref 0.61–1.24)
GFR calc Af Amer: 59 mL/min — ABNORMAL LOW (ref >60)
GFR, EST NON AFRICAN AMERICAN: 51 mL/min — AB (ref >60)
Glucose, Bld: 86 mg/dL (ref 65–99)
Potassium: 3.5 mmol/L (ref 3.5–5.1)
SODIUM: 136 mmol/L (ref 135–145)

## 2014-11-23 LAB — CBC
HEMATOCRIT: 43.3 % (ref 39.0–52.0)
Hemoglobin: 15 g/dL (ref 13.0–17.0)
MCH: 27.9 pg (ref 26.0–34.0)
MCHC: 34.6 g/dL (ref 30.0–36.0)
MCV: 80.6 fL (ref 78.0–100.0)
Platelets: 255 10*3/uL (ref 150–400)
RBC: 5.37 MIL/uL (ref 4.22–5.81)
RDW: 14.5 % (ref 11.5–15.5)
WBC: 7 10*3/uL (ref 4.0–10.5)

## 2014-11-23 LAB — HEPARIN LEVEL (UNFRACTIONATED)
HEPARIN UNFRACTIONATED: 0.21 [IU]/mL — AB (ref 0.30–0.70)
HEPARIN UNFRACTIONATED: 0.17 [IU]/mL — AB (ref 0.30–0.70)
Heparin Unfractionated: 0.39 IU/mL (ref 0.30–0.70)

## 2014-11-23 LAB — PROTIME-INR
INR: 1.39 (ref 0.00–1.49)
PROTHROMBIN TIME: 17.2 s — AB (ref 11.6–15.2)

## 2014-11-23 LAB — MAGNESIUM: Magnesium: 1.7 mg/dL (ref 1.7–2.4)

## 2014-11-23 MED ORDER — POTASSIUM CHLORIDE CRYS ER 20 MEQ PO TBCR
60.0000 meq | EXTENDED_RELEASE_TABLET | Freq: Once | ORAL | Status: AC
  Administered 2014-11-23: 60 meq via ORAL
  Filled 2014-11-23: qty 3

## 2014-11-23 MED ORDER — WARFARIN SODIUM 7.5 MG PO TABS
12.5000 mg | ORAL_TABLET | Freq: Once | ORAL | Status: AC
  Administered 2014-11-23: 12.5 mg via ORAL
  Filled 2014-11-23 (×2): qty 1

## 2014-11-23 NOTE — Progress Notes (Signed)
ANTICOAGULATION CONSULT NOTE - Follow Up Consult  Pharmacy Consult for Heparin  Indication: stroke, LV thrombus  No Known Allergies  Patient Measurements: Height: 5\' 11"  (180.3 cm) Weight: 269 lb 13.5 oz (122.4 kg) IBW/kg (Calculated) : 75.3   Vital Signs: Temp: 99 F (37.2 C) (07/07 0145) Temp Source: Oral (07/07 0145) BP: 143/77 mmHg (07/07 0145) Pulse Rate: 62 (07/07 0145)  Labs:  Recent Labs  11/21/14 0605 11/22/14 0536 11/22/14 1400 11/22/14 2311 11/23/14 0311  HGB 15.1 14.5  --   --  15.0  HCT 42.7 42.4  --   --  43.3  PLT 225 252  --   --  255  LABPROT 15.9* 17.9*  --   --  17.2*  INR 1.26 1.48  --   --  1.39  HEPARINUNFRC 0.56 0.82* 0.60 0.30 0.21*  CREATININE  --   --   --   --  1.76*    Estimated Creatinine Clearance: 82.4 mL/min (by C-G formula based on Cr of 1.76).   Assessment: Sub-therapeutic heparin level (note low goal of 0.3-0.5), infusing ok per RN.   Goal of Therapy:  Heparin level 0.3-0.5 units/ml Monitor platelets by anticoagulation protocol: Yes   Plan:  -Increase heparin at 1200 units/hr -1200 HL -Daily CBC/HL -Monitor for bleeding  Abran Duke 11/23/2014,5:18 AM
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
## 2014-11-23 NOTE — Progress Notes (Signed)
ANTICOAGULATION CONSULT NOTE - Follow Up Consult  Pharmacy Consult for Heparin / Coumadin Indication: LV thrombus, new CVA  No Known Allergies  Patient Measurements: Height: 5\' 11"  (180.3 cm) Weight: 264 lb 12.4 oz (120.1 kg) IBW/kg (Calculated) : 75.3 Heparin Dosing Weight: 102 kg  Vital Signs: Temp: 97.9 F (36.6 C) (07/07 1013) Temp Source: Oral (07/07 1013) BP: 151/83 mmHg (07/07 1013) Pulse Rate: 76 (07/07 1013)  Labs:  Recent Labs  11/21/14 0605 11/22/14 0536  11/22/14 2311 11/23/14 0311 11/23/14 1125  HGB 15.1 14.5  --   --  15.0  --   HCT 42.7 42.4  --   --  43.3  --   PLT 225 252  --   --  255  --   LABPROT 15.9* 17.9*  --   --  17.2*  --   INR 1.26 1.48  --   --  1.39  --   HEPARINUNFRC 0.56 0.82*  < > 0.30 0.21* 0.17*  CREATININE  --   --   --   --  1.76*  --   < > = values in this interval not displayed.  Estimated Creatinine Clearance: 81.6 mL/min (by C-G formula based on Cr of 1.76).  Assessment: 28 yom continuing on heparin bridge + warfarin for large LV thrombus causing CVA until INR is therapeutic (no anticoag pta). Heparin level remains low despite rate increases. No problems with the line per RN. INR also decreased today. CBC is WNL and no bleeding noted.  Goal of Therapy:  HL 0.3-0.5 Monitor platelets by anticoagulation protocol: Yes  INR = 2 to 3   Plan:  - Increase heparin gtt to 1400 units/hr - Check an 8 hour heparin level - Warfarin 12.5mg  PO x 1 today - giving at noon to better assess effect on AM INR - Daily heparin level, INR and CBC  Lysle Pearl, PharmD, BCPS Pager # [phone] 11/23/2014 12:51 PM

## 2014-11-23 NOTE — Progress Notes (Signed)
ANTICOAGULATION CONSULT NOTE - Follow Up Consult  Pharmacy Consult for Heparin  Indication: stroke, LV thrombus  No Known Allergies  Patient Measurements: Height: 5\' 11"  (180.3 cm) Weight: 264 lb 12.4 oz (120.1 kg) IBW/kg (Calculated) : 75.3   Vital Signs: Temp: 98.1 F (36.7 C) (07/07 1750) Temp Source: Oral (07/07 1750) BP: 121/77 mmHg (07/07 1750) Pulse Rate: 78 (07/07 1750)  Labs:  Recent Labs  11/21/14 0605 11/22/14 0536  11/23/14 0311 11/23/14 1125 11/23/14 2032  HGB 15.1 14.5  --  15.0  --   --   HCT 42.7 42.4  --  43.3  --   --   PLT 225 252  --  255  --   --   LABPROT 15.9* 17.9*  --  17.2*  --   --   INR 1.26 1.48  --  1.39  --   --   HEPARINUNFRC 0.56 0.82*  < > 0.21* 0.17* 0.39  CREATININE  --   --   --  1.76*  --   --   < > = values in this interval not displayed.  Estimated Creatinine Clearance: 81.6 mL/min (by C-G formula based on Cr of 1.76).   Assessment: Heparin level now therapeutic  Goal of Therapy:  Heparin level 0.3-0.5 units/ml Monitor platelets by anticoagulation protocol: Yes   Plan:  Continue heparin at 1400 units  Follow up AM labs  Thank you Okey Regal, PharmD [phone]   Elwin Sleight 11/23/2014,9:04 PM

## 2014-11-24 ENCOUNTER — Ambulatory Visit: Payer: Self-pay | Admitting: Pharmacist Clinician (PhC)/ Clinical Pharmacy Specialist

## 2014-11-24 LAB — CBC
HCT: 44.2 % (ref 39.0–52.0)
HEMOGLOBIN: 15.2 g/dL (ref 13.0–17.0)
MCH: 27.7 pg (ref 26.0–34.0)
MCHC: 34.4 g/dL (ref 30.0–36.0)
MCV: 80.7 fL (ref 78.0–100.0)
PLATELETS: 253 10*3/uL (ref 150–400)
RBC: 5.48 MIL/uL (ref 4.22–5.81)
RDW: 14.5 % (ref 11.5–15.5)
WBC: 6.1 10*3/uL (ref 4.0–10.5)

## 2014-11-24 LAB — BASIC METABOLIC PANEL
Anion gap: 9 (ref 5–15)
BUN: 16 mg/dL (ref 6–20)
CALCIUM: 9.4 mg/dL (ref 8.9–10.3)
CHLORIDE: 102 mmol/L (ref 101–111)
CO2: 24 mmol/L (ref 22–32)
CREATININE: 1.84 mg/dL — AB (ref 0.61–1.24)
GFR calc Af Amer: 56 mL/min — ABNORMAL LOW (ref >60)
GFR calc non Af Amer: 48 mL/min — ABNORMAL LOW (ref >60)
Glucose, Bld: 93 mg/dL (ref 65–99)
Potassium: 4.1 mmol/L (ref 3.5–5.1)
Sodium: 135 mmol/L (ref 135–145)

## 2014-11-24 LAB — HEPARIN LEVEL (UNFRACTIONATED): Heparin Unfractionated: 0.45 IU/mL (ref 0.30–0.70)

## 2014-11-24 LAB — PROTIME-INR
INR: 1.84 — ABNORMAL HIGH (ref 0.00–1.49)
Prothrombin Time: 21.2 seconds — ABNORMAL HIGH (ref 11.6–15.2)

## 2014-11-24 MED ORDER — COUMADIN BOOK
Freq: Once | Status: AC
  Administered 2014-11-24: 14:00:00
  Filled 2014-11-24: qty 1

## 2014-11-24 MED ORDER — WARFARIN VIDEO: Freq: Once | Status: DC

## 2014-11-24 MED ORDER — WARFARIN SODIUM 7.5 MG PO TABS
12.5000 mg | ORAL_TABLET | Freq: Once | ORAL | Status: AC
  Administered 2014-11-24: 12.5 mg via ORAL
  Filled 2014-11-24 (×2): qty 1
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
## 2014-11-24 NOTE — Discharge Instructions (Signed)
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
## 2014-11-24 NOTE — Progress Notes (Signed)
ANTICOAGULATION CONSULT NOTE - Follow Up Consult  Pharmacy Consult for Heparin / Coumadin Indication: LV thrombus, new CVA  No Known Allergies  Patient Measurements: Height: 5\' 11"  (180.3 cm) Weight: 264 lb 12.4 oz (120.1 kg) IBW/kg (Calculated) : 75.3 Heparin Dosing Weight: 102 kg  Vital Signs: Temp: 98.9 F (37.2 C) (07/08 0617) Temp Source: Oral (07/08 0617) BP: 150/82 mmHg (07/08 0617) Pulse Rate: 79 (07/08 0617)  Labs:  Recent Labs  11/22/14 0536  11/23/14 0311 11/23/14 1125 11/23/14 2032 11/24/14 0540  HGB 14.5  --  15.0  --   --  15.2  HCT 42.4  --  43.3  --   --  44.2  PLT 252  --  255  --   --  253  LABPROT 17.9*  --  17.2*  --   --  21.2*  INR 1.48  --  1.39  --   --  1.84*  HEPARINUNFRC 0.82*  < > 0.21* 0.17* 0.39 0.45  CREATININE  --   --  1.76*  --   --  1.84*  < > = values in this interval not displayed.  Estimated Creatinine Clearance: 78.1 mL/min (by C-G formula based on Cr of 1.84).  Assessment: 28 yom continuing on heparin bridge + warfarin for large LV thrombus causing CVA until INR is therapeutic (no anticoag pta). Heparin level is now therapeutic at 0.45. INR is now trending back up at 1.84. Anticipate INR to be therapeutic in next 1-2 days. CBC is WNL and no bleeding noted.  Goal of Therapy:  HL 0.3-0.5 Monitor platelets by anticoagulation protocol: Yes  INR = 2 to 3   Plan:  - Continue heparin gtt at 1400 units/hr - Warfarin 12.5mg  PO x 1 tonight - Daily heparin level, INR and CBC  Lysle Pearl, PharmD, BCPS Pager # [phone] 11/24/2014 8:16 AM

## 2014-11-24 NOTE — Hospital Discharge Follow-Up (Signed)
This Case Manager met with patient again at bedside to remind him of his [name] appointment on 11/27/14 at 1100 with Dr. Doreene Burke. Patient appreciative of reminder and indicates he will be at appointment. Spoke with Lorne Skeens, RN CM regarding patient. She anticipates a weekend discharge if INR>2.0.  No other discharge needs identified at this time.

## 2014-11-25 LAB — BASIC METABOLIC PANEL WITH GFR
Anion gap: 11 (ref 5–15)
BUN: 20 mg/dL (ref 6–20)
CO2: 25 mmol/L (ref 22–32)
Calcium: 9.5 mg/dL (ref 8.9–10.3)
Chloride: 101 mmol/L (ref 101–111)
Creatinine, Ser: 1.74 mg/dL — ABNORMAL HIGH (ref 0.61–1.24)
GFR calc Af Amer: 60 mL/min — ABNORMAL LOW
GFR calc non Af Amer: 51 mL/min — ABNORMAL LOW
Glucose, Bld: 93 mg/dL (ref 65–99)
Potassium: 4 mmol/L (ref 3.5–5.1)
Sodium: 137 mmol/L (ref 135–145)

## 2014-11-25 LAB — CBC
HCT: 42.4 % (ref 39.0–52.0)
Hemoglobin: 14.6 g/dL (ref 13.0–17.0)
MCH: 27.8 pg (ref 26.0–34.0)
MCHC: 34.4 g/dL (ref 30.0–36.0)
MCV: 80.8 fL (ref 78.0–100.0)
Platelets: 275 10*3/uL (ref 150–400)
RBC: 5.25 MIL/uL (ref 4.22–5.81)
RDW: 14.4 % (ref 11.5–15.5)
WBC: 6.2 10*3/uL (ref 4.0–10.5)

## 2014-11-25 LAB — PROTIME-INR
INR: 1.99 — ABNORMAL HIGH (ref 0.00–1.49)
INR: 1.99 — ABNORMAL HIGH (ref 0.00–1.49)
Prothrombin Time: 22.5 s — ABNORMAL HIGH (ref 11.6–15.2)
Prothrombin Time: 22.4 s — ABNORMAL HIGH (ref 11.6–15.2)

## 2014-11-25 LAB — HEPARIN LEVEL (UNFRACTIONATED): Heparin Unfractionated: 0.38 [IU]/mL (ref 0.30–0.70)

## 2014-11-25 MED ORDER — SIMVASTATIN 20 MG PO TABS: 20.0000 mg | ORAL_TABLET | Freq: Every day | ORAL | Status: DC

## 2014-11-25 MED ORDER — WARFARIN SODIUM 10 MG PO TABS: 10.0000 mg | ORAL_TABLET | Freq: Every day | ORAL | Status: DC

## 2014-11-25 MED ORDER — CARVEDILOL 6.25 MG PO TABS: 6.2500 mg | ORAL_TABLET | Freq: Two times a day (BID) | ORAL | Status: DC

## 2014-11-25 NOTE — Progress Notes (Signed)
Patient DC'd via car home.  DC instructions and prescriptions given to patient.  Both fully understood.  Vital signs and assessments were stable.

## 2014-11-25 NOTE — Discharge Summary (Signed)
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 11/18/14 2144 11/19/14 0752  GLUCAP 109* 87       Signed:  Ivie Maese A  [facility] 11/25/2014, 11:35 AM

## 2014-11-27 ENCOUNTER — Ambulatory Visit: Payer: Medicaid Other | Attending: Internal Medicine | Admitting: Family Medicine

## 2014-11-27 ENCOUNTER — Telehealth: Payer: Self-pay

## 2014-11-27 ENCOUNTER — Telehealth: Payer: Self-pay | Admitting: General Practice

## 2014-11-27 ENCOUNTER — Encounter: Payer: Self-pay | Admitting: Family Medicine

## 2014-11-27 VITALS — BP 133/89 | HR 84 | Temp 98.0°F | Ht 71.0 in | Wt 257.0 lb

## 2014-11-27 DIAGNOSIS — I213 ST elevation (STEMI) myocardial infarction of unspecified site: Secondary | ICD-10-CM

## 2014-11-27 DIAGNOSIS — I5042 Chronic combined systolic (congestive) and diastolic (congestive) heart failure: Secondary | ICD-10-CM | POA: Diagnosis not present

## 2014-11-27 DIAGNOSIS — I429 Cardiomyopathy, unspecified: Secondary | ICD-10-CM | POA: Insufficient documentation

## 2014-11-27 DIAGNOSIS — I10 Essential (primary) hypertension: Secondary | ICD-10-CM

## 2014-11-27 DIAGNOSIS — Z7901 Long term (current) use of anticoagulants: Secondary | ICD-10-CM | POA: Diagnosis not present

## 2014-11-27 DIAGNOSIS — Z8673 Personal history of transient ischemic attack (TIA), and cerebral infarction without residual deficits: Secondary | ICD-10-CM | POA: Diagnosis not present

## 2014-11-27 DIAGNOSIS — Z87891 Personal history of nicotine dependence: Secondary | ICD-10-CM | POA: Diagnosis not present

## 2014-11-27 DIAGNOSIS — I129 Hypertensive chronic kidney disease with stage 1 through stage 4 chronic kidney disease, or unspecified chronic kidney disease: Secondary | ICD-10-CM | POA: Insufficient documentation

## 2014-11-27 DIAGNOSIS — I513 Intracardiac thrombosis, not elsewhere classified: Secondary | ICD-10-CM

## 2014-11-27 DIAGNOSIS — E785 Hyperlipidemia, unspecified: Secondary | ICD-10-CM | POA: Insufficient documentation

## 2014-11-27 DIAGNOSIS — N183 Chronic kidney disease, stage 3 unspecified: Secondary | ICD-10-CM

## 2014-11-27 DIAGNOSIS — I313 Pericardial effusion (noninflammatory): Secondary | ICD-10-CM | POA: Diagnosis not present

## 2014-11-27 DIAGNOSIS — I6310 Cerebral infarction due to embolism of unspecified precerebral artery: Secondary | ICD-10-CM

## 2014-11-27 LAB — COMPREHENSIVE METABOLIC PANEL
ALT: 30 U/L (ref 0–53)
AST: 19 U/L (ref 0–37)
Albumin: 4.3 g/dL (ref 3.5–5.2)
Alkaline Phosphatase: 47 U/L (ref 39–117)
BUN: 28 mg/dL — ABNORMAL HIGH (ref 6–23)
CO2: 26 mEq/L (ref 19–32)
CREATININE: 1.74 mg/dL — AB (ref 0.50–1.35)
Calcium: 10 mg/dL (ref 8.4–10.5)
Chloride: 100 mEq/L (ref 96–112)
GLUCOSE: 93 mg/dL (ref 70–99)
POTASSIUM: 5.4 meq/L — AB (ref 3.5–5.3)
Sodium: 136 mEq/L (ref 135–145)
Total Bilirubin: 0.5 mg/dL (ref 0.2–1.2)
Total Protein: 7.5 g/dL (ref 6.0–8.3)

## 2014-11-27 LAB — POCT INR: INR: 2.7

## 2014-11-27 MED ORDER — HYDRALAZINE HCL 50 MG PO TABS: 50.0000 mg | ORAL_TABLET | Freq: Three times a day (TID) | ORAL | Status: DC

## 2014-11-27 MED ORDER — ISOSORBIDE MONONITRATE ER 60 MG PO TB24: 60.0000 mg | ORAL_TABLET | Freq: Every day | ORAL | Status: DC

## 2014-11-27 MED ORDER — WARFARIN SODIUM 5 MG PO TABS: 10.0000 mg | ORAL_TABLET | Freq: Every day | ORAL | Status: DC
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
## 2014-11-27 NOTE — Progress Notes (Signed)
Patient is an established patient who is here for hfu after CVA He reports taking his Coreg and Warfarin but needs to get his other medication filled.He states he does have Rx for them He is not a drug, tobacco, or ETOH user
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
## 2014-11-27 NOTE — Patient Instructions (Addendum)
Ischemic Stroke A stroke (cerebrovascular accident) is the sudden death of brain tissue. It is a medical emergency. A stroke can cause permanent loss of brain function. This can cause problems with different parts of your body. A transient ischemic attack (TIA) is different because it does not cause permanent damage. A TIA is a short-lived problem of poor blood flow affecting a part of the brain. A TIA is also a serious problem because having a TIA greatly increases the chances of having a stroke. When symptoms first develop, you cannot know if the problem might be a stroke or a TIA. CAUSES  A stroke is caused by a decrease of oxygen supply to an area of your brain. It is usually the result of a small blood clot or collection of cholesterol or fat (plaque) that blocks blood flow in the brain. A stroke can also be caused by blocked or damaged carotid arteries.  RISK FACTORS  High blood pressure (hypertension).  High cholesterol.  Diabetes mellitus.  Heart disease.  The buildup of plaque in the blood vessels (peripheral artery disease or atherosclerosis).  The buildup of plaque in the blood vessels providing blood and oxygen to the brain (carotid artery stenosis).  An abnormal heart rhythm (atrial fibrillation).  Obesity.  Smoking.  Taking oral contraceptives (especially in combination with smoking).  Physical inactivity.  A diet high in fats, salt (sodium), and calories.  Alcohol use.  Use of illegal drugs (especially cocaine and methamphetamine).  Being African American.  Being over the age of 39.  Family history of stroke.  Previous history of blood clots, stroke, TIA, or heart attack.  Sickle cell disease. SYMPTOMS  These symptoms usually develop suddenly, or may be newly present upon awakening from sleep:  Sudden weakness or numbness of the face, arm, or leg, especially on one side of the body.  Sudden trouble walking or difficulty moving arms or legs.  Sudden  confusion.  Sudden personality changes.  Trouble speaking (aphasia) or understanding.  Difficulty swallowing.  Sudden trouble seeing in one or both eyes.  Double vision.  Dizziness.  Loss of balance or coordination.  Sudden severe headache with no known cause.  Trouble reading or writing. DIAGNOSIS  Your health care provider can often determine the presence or absence of a stroke based on your symptoms, history, and physical exam. Computed tomography (CT) of the brain is usually performed to confirm the stroke, determine causes, and determine stroke severity. Other tests may be done to find the cause of the stroke. These tests may include:  Electrocardiography.  Continuous heart monitoring.  Echocardiography.  Carotid ultrasonography.  Magnetic resonance imaging (MRI).  A scan of the brain circulation.  Blood tests. PREVENTION  The risk of a stroke can be decreased by appropriately treating high blood pressure, high cholesterol, diabetes, heart disease, and obesity and by quitting smoking, limiting alcohol, and staying physically active. TREATMENT  Time is of the essence. It is important to seek treatment at the first sign of these symptoms because you may receive a medicine to dissolve the clot (thrombolytic) that cannot be given if too much time has passed since your symptoms began. Even if you do not know when your symptoms began, get treatment as soon as possible as there are other treatment options available including oxygen, intravenous (IV) fluids, and medicines to thin the blood (anticoagulants). Treatment of stroke depends on the duration, severity, and cause of your symptoms. Medicines and dietary changes may be used to address diabetes, high blood  pressure, and other risk factors. Physical, speech, and occupational therapists will assess you and work with you to improve any functions impaired by the stroke. Measures will be taken to prevent short-term and long-term  complications, including infection from breathing foreign material into the lungs (aspiration pneumonia), blood clots in the legs, bedsores, and falls. Rarely, surgery may be needed to remove large blood clots or to open up blocked arteries. HOME CARE INSTRUCTIONS   Take medicines only as directed by your health care provider. Follow the directions carefully. Medicines may be used to control risk factors for a stroke. Be sure you understand all your medicine instructions.  You may be told to take a medicine to thin the blood, such as aspirin or the anticoagulant warfarin. Warfarin needs to be taken exactly as instructed.  Too much and too little warfarin are both dangerous. Too much warfarin increases the risk of bleeding. Too little warfarin continues to allow the risk for blood clots. While taking warfarin, you will need to have regular blood tests to measure your blood clotting time. These blood tests usually include both the PT and INR tests. The PT and INR results allow your health care provider to adjust your dose of warfarin. The dose can change for many reasons. It is critically important that you take warfarin exactly as prescribed, and that you have your PT and INR levels drawn exactly as directed.  Many foods, especially foods high in vitamin K, can interfere with warfarin and affect the PT and INR results. Foods high in vitamin K include spinach, kale, broccoli, cabbage, collard and turnip greens, brussels sprouts, peas, cauliflower, seaweed, and parsley, as well as beef and pork liver, green tea, and soybean oil. You should eat a consistent amount of foods high in vitamin K. Avoid major changes in your diet, or notify your health care provider before changing your diet. Arrange a visit with a dietitian to answer your questions.  Many medicines can interfere with warfarin and affect the PT and INR results. You must tell your health care provider about any and all medicines you take. This  includes all vitamins and supplements. Be especially cautious with aspirin and anti-inflammatory medicines. Do not take or discontinue any prescribed or over-the-counter medicine except on the advice of your health care provider or pharmacist.  Warfarin can have side effects, such as excessive bruising or bleeding. You will need to hold pressure over cuts for longer than usual. Your health care provider or pharmacist will discuss other potential side effects.  Avoid sports or activities that may cause injury or bleeding.  Be mindful when shaving, flossing your teeth, or handling sharp objects.  Alcohol can change the body's ability to handle warfarin. It is best to avoid alcoholic drinks or consume only very small amounts while taking warfarin. Notify your health care provider if you change your alcohol intake.  Notify your dentist or other health care providers before procedures.  If swallow studies have determined that your swallowing reflex is present, you should eat healthy foods. Including 5 or more servings of fruits and vegetables a day may reduce the risk of stroke. Foods may need to be a certain consistency (soft or pureed), or small bites may need to be taken in order to avoid aspirating or choking. Certain dietary changes may be advised to address high blood pressure, high cholesterol, diabetes, or obesity.  Food choices that are low in sodium, saturated fat, trans fat, and cholesterol are recommended to manage high blood pressure.  Food choies that are high in fiber, and low in saturated fat, trans fat, and cholesterol may control cholesterol levels.  Controlling carbohydrates and sugar intake is recommended to manage diabetes.  Reducing calorie intake and making food choices that are low in sodium, saturated fat, trans fat, and cholesterol are recommended to manage obesity.  Maintain a healthy weight.  Stay physically active. It is recommended that you get at least 30 minutes of  activity on all or most days.  Do not use any tobacco products including cigarettes, chewing tobacco, or electronic cigarettes.  Limit alcohol use even if you are not taking warfarin. Moderate alcohol use is considered to be:  No more than 2 drinks each day for men.  No more than 1 drink each day for nonpregnant women.  Home safety. A safe home environment is important to reduce the risk of falls. Your health care provider may arrange for specialists to evaluate your home. Having grab bars in the bedroom and bathroom is often important. Your health care provider may arrange for equipment to be used at home, such as raised toilets and a seat for the shower.  Physical, occupational, and speech therapy. Ongoing therapy may be needed to maximize your recovery after a stroke. If you have been advised to use a walker or a cane, use it at all times. Be sure to keep your therapy appointments.  Follow all instructions for follow-up with your health care provider. This is very important. This includes any referrals, physical therapy, rehabilitation, and lab tests. Proper follow-up can prevent another stroke from occurring. SEEK MEDICAL CARE IF:  You have personality changes.  You have difficulty swallowing.  You are seeing double.  You have dizziness.  You have a fever.  You have skin breakdown. SEEK IMMEDIATE MEDICAL CARE IF:  Any of these symptoms may represent a serious problem that is an emergency. Do not wait to see if the symptoms will go away. Get medical help right away. Call your local emergency services (911 in [place] Do not drive yourself to the hospital.  You have sudden weakness or numbness of the face, arm, or leg, especially on one side of the body.  You have sudden trouble walking or difficulty moving arms or legs.  You have sudden confusion.  You have trouble speaking (aphasia) or understanding.  You have sudden trouble seeing in one or both eyes.  You have a loss of  balance or coordination.  You have a sudden, severe headache with no known cause.  You have new chest pain or an irregular heartbeat.  You have a partial or total loss of consciousness. Document Released: 05/05/2005 Document Revised: 09/19/2013 Document Reviewed: 12/14/2011 [facility] Patient Information 2015 [place], [place]. This information is not intended to replace advice given to you by your health care provider. Make sure you discuss any questions you have with your health care provider. Warfarin tablets What is this medicine? WARFARIN (WAR far in) is an anticoagulant. It is used to treat or prevent clots in the veins, arteries, lungs, or heart. This medicine may be used for other purposes; ask your health care provider or pharmacist if you have questions. COMMON BRAND NAME(S): Coumadin, Jantoven What should I tell my health care provider before I take this medicine? They need to know if you have any of these conditions: -alcoholism -anemia -bleeding disorders -cancer -diabetes -heart disease -high blood pressure -history of bleeding in the gastrointestinal tract -history of stroke or other brain injury or disease -kidney or  liver disease -protein C deficiency -protein S deficiency -psychosis or dementia -recent injury, recent or planned surgery or procedure -an unusual or allergic reaction to warfarin, other medicines, foods, dyes, or preservatives -pregnant or trying to get pregnant -breast-feeding How should I use this medicine? Take this medicine by mouth with a glass of water. Follow the directions on the prescription label. You can take this medicine with or without food. Take your medicine at the same time each day. Do not take it more often than directed. Do not stop taking except on your doctor's advice. Stopping this medicine may increase your risk of a blood clot. Be sure to refill your prescription before you run out of medicine. If your doctor or healthcare  professional calls to change your dose, write down the dose and any other instructions. Always read the dose and instructions back to him or her to make sure you understand them. Tell your doctor or healthcare professional what strength of tablets you have on hand. Ask how many tablets you should take to equal your new dose. Write the date on the new instructions and keep them near your medicine. If you are told to stop taking your medicine until your next blood test, call your doctor or healthcare professional if you do not hear anything within 24 hours of the test to find out your new dose or when to restart your prior dose. A special MedGuide will be given to you by the pharmacist with each prescription and refill. Be sure to read this information carefully each time. Talk to your pediatrician regarding the use of this medicine in children. Special care may be needed. Overdosage: If you think you have taken too much of this medicine contact a poison control center or emergency room at once. NOTE: This medicine is only for you. Do not share this medicine with others. What if I miss a dose? It is important not to miss a dose. If you miss a dose, call your healthcare provider. Take the dose as soon as possible on the same day. If it is almost time for your next dose, take only that dose. Do not take double or extra doses to make up for a missed dose. What may interact with this medicine? Do not take this medicine with any of the following medications: -agents that prevent or dissolve blood clots -aspirin or other salicylates -danshen -dextrothyroxine -mifepristone -St. John's Wort -red yeast rice This medicine may also interact with the following medications: -acetaminophen -agents that lower cholesterol -alcohol -allopurinol -amiodarone -antibiotics or medicines for treating bacterial, fungal or viral infections -azathioprine -barbiturate medicines for inducing sleep or treating  seizures -certain medicines for diabetes -certain medicines for heart rhythm problems -certain medicines for high blood pressure -chloral hydrate -cisapride -disulfiram -male hormones, including contraceptive or birth control pills -general anesthetics -herbal or dietary products like garlic, ginkgo, ginseng, green tea, or kava kava -influenza virus vaccine -male hormones -medicines for mental depression or psychosis -medicines for some types of cancer -medicines for stomach problems -methylphenidate -NSAIDs, medicines for pain and inflammation, like ibuprofen or naproxen -propoxyphene -quinidine, quinine -raloxifene -seizure or epilepsy medicine like carbamazepine, phenytoin, and valproic acid -steroids like cortisone and prednisone -tamoxifen -thyroid medicine -tramadol -vitamin c, vitamin e, and vitamin K -zafirlukast -zileuton This list may not describe all possible interactions. Give your health care provider a list of all the medicines, herbs, non-prescription drugs, or dietary supplements you use. Also tell them if you smoke, drink alcohol, or use illegal drugs.  Some items may interact with your medicine. What should I watch for while using this medicine? Visit your doctor or health care professional for regular checks on your progress. You will need to have a blood test called a PT/INR regularly. The PT/INR blood test is done to make sure you are getting the right dose of this medicine. It is important to not miss your appointment for the blood tests. When you first start taking this medicine, these tests are done often. Once the correct dose is determined and you take your medicine properly, these tests can be done less often. Notify your doctor or health care professional and seek emergency treatment if you develop breathing problems; changes in vision; chest pain; severe, sudden headache; pain, swelling, warmth in the leg; trouble speaking; sudden numbness or weakness of  the face, arm or leg. These can be signs that your condition has gotten worse. While you are taking this medicine, carry an identification card with your name, the name and dose of medicine(s) being used, and the name and phone number of your doctor or health care professional or person to contact in an emergency. Do not start taking or stop taking any medicines or over-the-counter medicines except on the advice of your doctor or health care professional. You should discuss your diet with your doctor or health care professional. Do not make major changes in your diet. Vitamin K can affect how well this medicine works. Many foods contain vitamin K. It is important to eat a consistent amount of foods with vitamin K. Other foods with vitamin K that you should eat in consistent amounts are asparagus, basil, beef or pork liver, black eyed peas, broccoli, brussel sprouts, cabbage, chickpeas, cucumber with peel, green onions, green tea, okra, parsley, peas, thyme, and green leafy vegetables like beet greens, collard greens, endive, kale, mustard greens, spinach, turnip greens, watercress, or certain lettuces like green leaf or romaine. This medicine can cause birth defects or bleeding in an unborn child. Women of childbearing age should use effective birth control while taking this medicine. If a woman becomes pregnant while taking this medicine, she should discuss the potential risks and her options with her health care professional. Avoid sports and activities that might cause injury while you are using this medicine. Severe falls or injuries can cause unseen bleeding. Be careful when using sharp tools or knives. Consider using an Neurosurgeon. Take special care brushing or flossing your teeth. Report any injuries, bruising, or red spots on the skin to your doctor or health care professional. If you have an illness that causes vomiting, diarrhea, or fever for more than a few days, contact your doctor. Also check  with your doctor if you are unable to eat for several days. These problems can change the effect of this medicine. Even after you stop taking this medicine, it takes several days before your body recovers its normal ability to clot blood. Ask your doctor or health care professional how long you need to be careful. If you are going to have surgery or dental work, tell your doctor or health care professional that you have been taking this medicine. What side effects may I notice from receiving this medicine? Side effects that you should report to your doctor or health care professional as soon as possible: -back pain -chills -dizziness -fever -heavy menstrual bleeding or vaginal bleeding -painful, blue, or purple toes -painful, prolonged erection -signs and symptoms of bleeding such as bloody or black, tarry stools; red or  dark-brown urine; spitting up blood or brown material that looks like coffee grounds; red spots on the skin; unusual bruising or bleeding from the eye, gums, or nose-skin rash, itching or skin damage -stomach pain -unusually weak or tired -yellowing of skin or eyes Side effects that usually do not require medical attention (report to your doctor or health care professional if they continue or are bothersome): -diarrhea -hair loss This list may not describe all possible side effects. Call your doctor for medical advice about side effects. You may report side effects to FDA at 1-800-FDA-1088. Where should I keep my medicine? Keep out of the reach of children. Store at room temperature between 15 and 30 degrees C (59 and 86 degrees F). Protect from light. Throw away any unused medicine after the expiration date. Do not flush down the toilet. NOTE: This sheet is a summary. It may not cover all possible information. If you have questions about this medicine, talk to your doctor, pharmacist, or health care provider.  2015, [employer]. (2012-11-24 12:17:56)

## 2014-11-28 ENCOUNTER — Telehealth: Payer: Self-pay | Admitting: *Deleted

## 2014-11-28 NOTE — Telephone Encounter (Signed)
-----   Message from Jaclyn Shaggy, MD sent at 11/28/2014  8:20 AM EDT ----- Mildly elevated potassium and creatinine; i will repeat at next office visit.

## 2014-11-28 NOTE — Telephone Encounter (Signed)
Verified name and date of birth and gave results 

## 2014-11-30 ENCOUNTER — Telehealth: Payer: Self-pay | Admitting: *Deleted

## 2014-11-30 NOTE — Telephone Encounter (Signed)
Called patient to verify that he would be at his [facility] appointment on Monday at 1430.  Patient states he has gotten all of his medications and is taking them.

## 2014-12-04 ENCOUNTER — Ambulatory Visit: Payer: Medicaid Other | Attending: Family Medicine | Admitting: Family Medicine

## 2014-12-04 ENCOUNTER — Encounter: Payer: Self-pay | Admitting: Family Medicine

## 2014-12-04 VITALS — BP 131/86 | HR 86 | Temp 98.8°F | Resp 16 | Ht 71.0 in | Wt 260.0 lb

## 2014-12-04 DIAGNOSIS — N183 Chronic kidney disease, stage 3 unspecified: Secondary | ICD-10-CM

## 2014-12-04 DIAGNOSIS — I129 Hypertensive chronic kidney disease with stage 1 through stage 4 chronic kidney disease, or unspecified chronic kidney disease: Secondary | ICD-10-CM | POA: Insufficient documentation

## 2014-12-04 DIAGNOSIS — I10 Essential (primary) hypertension: Secondary | ICD-10-CM

## 2014-12-04 DIAGNOSIS — E875 Hyperkalemia: Secondary | ICD-10-CM | POA: Diagnosis present

## 2014-12-04 DIAGNOSIS — I5042 Chronic combined systolic (congestive) and diastolic (congestive) heart failure: Secondary | ICD-10-CM | POA: Diagnosis not present

## 2014-12-04 DIAGNOSIS — I213 ST elevation (STEMI) myocardial infarction of unspecified site: Secondary | ICD-10-CM | POA: Diagnosis not present

## 2014-12-04 DIAGNOSIS — Z87891 Personal history of nicotine dependence: Secondary | ICD-10-CM | POA: Insufficient documentation

## 2014-12-04 DIAGNOSIS — I513 Intracardiac thrombosis, not elsewhere classified: Secondary | ICD-10-CM

## 2014-12-04 LAB — BASIC METABOLIC PANEL
BUN: 24 mg/dL — ABNORMAL HIGH (ref 6–23)
CO2: 26 meq/L (ref 19–32)
Calcium: 9.7 mg/dL (ref 8.4–10.5)
Chloride: 103 mEq/L (ref 96–112)
Creat: 1.77 mg/dL — ABNORMAL HIGH (ref 0.50–1.35)
GLUCOSE: 84 mg/dL (ref 70–99)
Potassium: 4.3 mEq/L (ref 3.5–5.3)
SODIUM: 139 meq/L (ref 135–145)

## 2014-12-04 LAB — POCT INR: INR: 3.6

## 2014-12-04 MED ORDER — AMLODIPINE BESYLATE 5 MG PO TABS: 5.0000 mg | ORAL_TABLET | Freq: Every day | ORAL | Status: DC

## 2014-12-04 NOTE — Patient Instructions (Signed)
Warfarin: What You Need to Know Warfarin is an anticoagulant. Anticoagulants help prevent the formation of blood clots. They also help stop the growth of blood clots. Warfarin is sometimes referred to as a "blood thinner."  Normally, when body tissues are cut or damaged, the blood clots in order to prevent blood loss. Sometimes clots form inside your blood vessels and obstruct the flow of blood through your circulatory system (thrombosis). These clots may travel through your bloodstream and become lodged in smaller blood vessels in your brain, which can cause a stroke, or in your lungs (pulmonary embolism). WHO SHOULD USE WARFARIN? Warfarin is prescribed for people at risk of developing harmful blood clots:  People with surgically implanted mechanical heart valves, irregular heart rhythms called atrial fibrillation, and certain clotting disorders.  People who have developed harmful blood clotting in the past, including those who have had a stroke or a pulmonary embolism, or thrombosis in their legs (deep vein thrombosis [DVT]).  People with an existing blood clot, such as a pulmonary embolism. WARFARIN DOSING Warfarin tablets come in different strengths. Each tablet strength is a different color, with the amount of warfarin (in milligrams) clearly printed on the tablet. If the color of your tablet is different than usual when you receive a new prescription, report it immediately to your pharmacist or health care provider. WARFARIN MONITORING The goal of warfarin therapy is to lessen the clotting tendency of blood but not prevent clotting completely. Your health care provider will monitor the anticoagulation effect of warfarin closely and adjust your dose as needed. For your safety, blood tests called prothrombin time (PT) or international normalized ratio (INR) are used to measure the effects of warfarin. Both of these tests can be done with a finger stick or a blood draw. The longer it takes the  blood to clot, the higher the PT or INR. Your health care provider will inform you of your "target" PT or INR range. If, at any time, your PT or INR is above the target range, there is a risk of bleeding. If your PT or INR is below the target range, there is a risk of clotting. Whether you are started on warfarin while you are in the hospital or in your health care provider's office, you will need to have your PT or INR checked within one week of starting the medicine. Initially, some people are asked to have their PT or INR checked as much as twice a week. Once you are on a stable maintenance dose, the PT or INR is checked less often, usually once every 2 to 4 weeks. The warfarin dose may be adjusted if the PT or INR is not within the target range. It is important to keep all laboratory and health care provider follow-up appointments. Not keeping appointments could result in a chronic or permanent injury, pain, or disability because warfarin is a medicine that requires close monitoring. WHAT ARE THE SIDE EFFECTS OF WARFARIN?  Too much warfarin can cause bleeding (hemorrhage) from any part of the body. This may include bleeding from the gums, blood in the urine, bloody or dark stools, a nosebleed that is not easily stopped, coughing up blood, or vomiting blood.  Too little warfarin can increase the risk of blood clots.  Too little or too much warfarin can also increase the risk of a stroke.  Warfarin use may cause a skin rash or irritation, an unusual fever, continual nausea or stomach upset, or severe pain in your joints or back.   SPECIAL PRECAUTIONS WHILE TAKING WARFARIN Warfarin should be taken exactly as directed. It is very important to take warfarin as directed since bleeding or blood clots could result in chronic or permanent injury, pain, or disability.  Take your medicine at the same time every day. If you forget to take your dose, you can take it if it is within 6 hours of when it was  due.  Do not change the dose of warfarin on your own to make up for missed or extra doses.  If you miss more than 2 doses in a row, you should contact your health care provider for advice. Avoid situations that cause bleeding. You may have a tendency to bleed more easily than usual while taking warfarin. The following actions can limit bleeding:  Using a softer toothbrush.  Flossing with waxed floss rather than unwaxed floss.  Shaving with an electric razor rather than a blade.  Limiting the use of sharp objects.  Avoiding potentially harmful activities, such as contact sports. Warfarin and Pregnancy or Breastfeeding  Warfarin is not advised during the first trimester of pregnancy due to an increased risk of birth defects. In certain situations, a woman may take warfarin after her first trimester of pregnancy. A woman who becomes pregnant or plans to become pregnant while taking warfarin should notify her health care provider immediately.  Although warfarin does not pass into breast milk, a woman who wishes to breastfeed while taking warfarin should also consult with her health care provider. Alcohol, Smoking, and Illicit Drug Use  Alcohol affects how warfarin works in the body. It is best to avoid alcoholic drinks or consume very small amounts while taking warfarin. In general, alcohol intake should be limited to 1 oz (30 mL) of liquor, 6 oz (180 mL) of wine, or 12 oz (360 mL) of beer each day. Notify your health care provider if you change your alcohol intake.  Smoking affects how warfarin works. It is best to avoid smoking while taking warfarin. Notify your health care provider if you change your smoking habits.  It is best to avoid all illicit drugs while taking warfarin since there are few studies that show how warfarin interacts with these drugs. Other Medicines and Dietary Supplements Many prescription and over-the-counter medicines can interfere with warfarin. Be sure all of your  health care providers know you are taking warfarin. Notify your health care provider who prescribed warfarin for you or your pharmacist before starting or stopping any new medicines, including over-the-counter vitamins, dietary supplements, and pain medicines. Your warfarin dose may need to be adjusted. Some common over-the-counter medicines that may increase the risk of bleeding while taking warfarin include:   Acetaminophen.  Aspirin.  Nonsteroidal anti-inflammatory medicines (NSAIDs), such as ibuprofen or naproxen.  Vitamin E. Dietary Considerations  Foods that have moderate or high amounts of vitamin K can interfere with warfarin. Avoid major changes in your diet or notify your health care provider before changing your diet. Eat a consistent amount of foods that have moderate or high amounts of vitamin K. Eating less foods containing vitamin K can increase the risk of bleeding. Eating more foods containing vitamin K can increase the risk of blood clots. Additional questions about dietary considerations can be discussed with a dietitian. Foods that are very high in vitamin K:  Greens, such as Swiss chard and beet, collard, mustard, or turnip greens (fresh or frozen, cooked).  Kale (fresh or frozen, cooked).  Parsley (raw).  Spinach (cooked). Foods that are high   in vitamin K:  Asparagus (frozen, cooked).  Beans, green (frozen, cooked).  Broccoli.  Bok choy (cooked).  Brussels sprouts (fresh or frozen, cooked).  Cabbage (cooked).   Coleslaw. Foods that are moderately high in vitamin K:  Blueberries.  Black-eyed peas.  Endive (raw).  Green leaf lettuce (raw).  Green scallions (raw).  Kale (raw).  Okra (frozen, cooked).  Plantains (fried).  Romaine lettuce (raw).  Sauerkraut (canned).  Spinach (raw). CALL YOUR CLINIC OR HEALTH CARE PROVIDER IF YOU:  Plan to have any surgery or procedure.  Feel sick, especially if you have diarrhea or  vomiting.  Experience or anticipate any major changes in your diet.  Start or stop a prescription or over-the-counter medicine.  Become, plan to become, or think you may be pregnant.  Are having heavier than usual menstrual periods.  Have had a fall, accident, or any symptoms of bleeding or unusual bruising.  Develop an unusual fever. CALL 911 IN THE [place] OR GO TO THE EMERGENCY DEPARTMENT IF YOU:   Think you may be having an allergic reaction to warfarin. The signs of an allergic reaction could include itching, rash, hives, swelling, chest tightness, or trouble breathing.  See signs of blood in your urine. The signs could include reddish, pinkish, or tea-colored urine.  See signs of blood in your stools. The signs could include bright red or black stools.  Vomit or cough up blood. In these instances, the blood could have either a bright red or a "coffee-grounds" appearance.  Have bleeding that will not stop after applying pressure for 30 minutes such as cuts, nosebleeds, or other injuries.  Have severe pain in your joints or back.  Have a new and severe headache.  Have sudden weakness or numbness of your face, arm, or leg, especially on one side of your body.  Have sudden confusion or trouble understanding.  Have sudden trouble seeing in one or both eyes.  Have sudden trouble walking, dizziness, loss of balance, or coordination.  Have trouble speaking or understanding (aphasia). Document Released: 05/05/2005 Document Revised: 09/19/2013 Document Reviewed: 10/29/2012 Exit[employer] Patient Information 2015 [employer]. This information is not intended to replace advice given to you by your health care provider. Make sure you discuss any questions you have with your health care provider.  
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
## 2014-12-04 NOTE — Progress Notes (Signed)
Patient reports no complaints and is taking all his medication appropriately Reports no depression, cigarette, drug, or ETOH use

## 2014-12-05 ENCOUNTER — Telehealth: Payer: Self-pay | Admitting: *Deleted

## 2014-12-05 NOTE — Telephone Encounter (Signed)
Gave results to patient and verified name and date of birth.

## 2014-12-05 NOTE — Telephone Encounter (Signed)
-----   Message from Jaclyn Shaggy, MD sent at 12/05/2014  8:28 AM EDT ----- Please inform him that his potassium level is back to normal. Renal function is stable.

## 2014-12-08 ENCOUNTER — Telehealth: Payer: Self-pay

## 2014-12-08 ENCOUNTER — Other Ambulatory Visit: Payer: Self-pay

## 2014-12-08 MED ORDER — WARFARIN SODIUM 5 MG PO TABS: 10.0000 mg | ORAL_TABLET | Freq: Every day | ORAL | Status: DC

## 2014-12-08 NOTE — Telephone Encounter (Signed)
Received return call from patient.  Patient reminded of appointment on 12/11/14 at 1030 with Dr. Venetia Night. He indicates he will be at appointment. No transportation barriers noted.  Reviewed patient's medication list and stressed importance of medication compliance. Patient indicates he needs Coumadin 5 mg tablets; he indicates he only has Coumadin 10 mg tablets at this time. Spoke with [facility] pharmacy, and they indicate patient already picked up medication. Spoke with patient again, and he indicates he "lost it."  Discussed with Dr. Venetia Night who indicates Coumadin can be refilled.  Verbal order entered.  Patient updated. Patient verbalized understanding and indicates he will pick up medication today.  Also stressed importance of a low sodium diet. Patient indicates he is being compliant with a low sodium diet. Encouraged patient to weigh himself daily and bring weight log into his appointment with Dr. Venetia Night on 12/11/14. Patient verbalized understanding. No other needs identified.

## 2014-12-08 NOTE — Telephone Encounter (Signed)
Call placed to patient to check on status, to determine if patient being compliant with medications, and to remind patient of appointment on 12/11/14 at 1030 with Dr. Venetia Night. Call placed to patient at [phone]; unable to reach. HIPPA compliant voicemail left for patient requesting return call. Call also placed to [phone]; message left for patient requesting return call.

## 2014-12-11 ENCOUNTER — Encounter: Payer: Self-pay | Admitting: Family Medicine

## 2014-12-11 ENCOUNTER — Ambulatory Visit: Payer: Medicaid Other | Attending: Family Medicine | Admitting: Family Medicine

## 2014-12-11 VITALS — BP 140/89 | HR 87 | Temp 98.7°F | Resp 18 | Ht 71.0 in | Wt 260.0 lb

## 2014-12-11 DIAGNOSIS — Z7901 Long term (current) use of anticoagulants: Secondary | ICD-10-CM | POA: Insufficient documentation

## 2014-12-11 DIAGNOSIS — I5042 Chronic combined systolic (congestive) and diastolic (congestive) heart failure: Secondary | ICD-10-CM | POA: Diagnosis not present

## 2014-12-11 DIAGNOSIS — I6310 Cerebral infarction due to embolism of unspecified precerebral artery: Secondary | ICD-10-CM

## 2014-12-11 DIAGNOSIS — Z8673 Personal history of transient ischemic attack (TIA), and cerebral infarction without residual deficits: Secondary | ICD-10-CM | POA: Diagnosis present

## 2014-12-11 DIAGNOSIS — I429 Cardiomyopathy, unspecified: Secondary | ICD-10-CM | POA: Insufficient documentation

## 2014-12-11 DIAGNOSIS — N183 Chronic kidney disease, stage 3 unspecified: Secondary | ICD-10-CM

## 2014-12-11 DIAGNOSIS — R51 Headache: Secondary | ICD-10-CM | POA: Diagnosis not present

## 2014-12-11 DIAGNOSIS — I129 Hypertensive chronic kidney disease with stage 1 through stage 4 chronic kidney disease, or unspecified chronic kidney disease: Secondary | ICD-10-CM | POA: Diagnosis not present

## 2014-12-11 DIAGNOSIS — Z87891 Personal history of nicotine dependence: Secondary | ICD-10-CM | POA: Diagnosis not present

## 2014-12-11 DIAGNOSIS — I509 Heart failure, unspecified: Secondary | ICD-10-CM | POA: Insufficient documentation

## 2014-12-11 DIAGNOSIS — E875 Hyperkalemia: Secondary | ICD-10-CM | POA: Diagnosis not present

## 2014-12-11 DIAGNOSIS — E785 Hyperlipidemia, unspecified: Secondary | ICD-10-CM | POA: Insufficient documentation

## 2014-12-11 DIAGNOSIS — I513 Intracardiac thrombosis, not elsewhere classified: Secondary | ICD-10-CM

## 2014-12-11 DIAGNOSIS — I82890 Acute embolism and thrombosis of other specified veins: Secondary | ICD-10-CM | POA: Diagnosis not present

## 2014-12-11 DIAGNOSIS — I213 ST elevation (STEMI) myocardial infarction of unspecified site: Secondary | ICD-10-CM

## 2014-12-11 DIAGNOSIS — Z79899 Other long term (current) drug therapy: Secondary | ICD-10-CM | POA: Diagnosis not present

## 2014-12-11 DIAGNOSIS — I10 Essential (primary) hypertension: Secondary | ICD-10-CM

## 2014-12-11 LAB — POCT INR: INR: 2.4

## 2014-12-11 MED ORDER — LISINOPRIL 10 MG PO TABS: 10.0000 mg | ORAL_TABLET | Freq: Every day | ORAL | Status: DC

## 2014-12-11 NOTE — Progress Notes (Signed)
Patient here for TCC follow up. Patient reports feel good until he takes all his medications. Patient has headache after taking medications.  Patient reports losing 26 lbs and thinks medications may be "messing me up". Patient requesting potassium and kidney lab results. Patient denies pain at this time.   Patient has taken carvedilol, furosemide, and 5mg  warfarin with breakfast. Patient takes the rest with lunch.

## 2014-12-11 NOTE — Patient Instructions (Signed)

## 2014-12-11 NOTE — Progress Notes (Signed)
Subjective:    Patient ID: Shawn [name], male    DOB: 01-Nov-1985, 29 y.o.   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   He was hyperkalemic at his last visit with a potassium of 5.4 which subsequently resolved after cessation of potassium intake. Complains of headaches every time he takes his medications and headache is a 3-4/10. Denies blurry vision, nausea or vomiting.  Past Medical History  Diagnosis Date  . Hypertension     at age 71  . Malignant hypertension     dx age 52  . Nonischemic cardiomyopathy   . Penile adhesions   . CKD (chronic kidney disease), stage III   . Dyspnea on exertion   . Hyperlipidemia   . At risk for sleep apnea     STOP-BANG= 4      SENT TO PCP 11-10-2014  . Systolic and diastolic CHF, chronic dx 09-18-2014    cardiologist-  dr Rachelle Hora croitoru----  ef 25-30% per last note 11-03-2014    Past Surgical History  Procedure Laterality Date  . Transthoracic echocardiogram  09-19-2014  dr croitoru    moderate LVH,  ef 35% (per dr croitoru note, ef 25-30%), diffuse hypokinesis of basal inferior myocardium,  trivial AR,  mild MR  . No past surgeries    . Circumcision revision N/A 11/14/2014    Procedure: CIRCUMCISION REVISION, LYSIS OF ADHESIONS;  Surgeon: Su Grand, MD;  Location: [facility];  Service: Urology;  Laterality: N/A;    History   Social History  . Marital Status: Single    Spouse Name: N/A  . Number of Children:  N/A  . Years of Education: N/A   Occupational History  . Not on file.   Social History Main Topics  . Smoking status: Former Smoker -- 0.50 packs/day for 10 years    Types: Cigarettes    Quit date: 04/19/2014  . Smokeless tobacco: Never Used  . Alcohol Use: No  . Drug Use: Yes    Special: Marijuana     Comment: Last used marijuana 3 weeks ago  . Sexual Activity: Not on file   Other Topics Concern  . Not on file   Social History Narrative    No Known Allergies  Current Outpatient Prescriptions on File Prior to Visit  Medication Sig Dispense Refill  . amLODipine (NORVASC) 5 MG tablet Take 1 tablet (5 mg total) by mouth daily. 30 tablet 3  . carvedilol (COREG) 6.25 MG tablet Take 1 tablet (6.25 mg total) by mouth 2 (two) times daily with a meal. 60 tablet 0  . furosemide (LASIX) 40 MG tablet Take 0.5 tablets (20 mg total) by mouth daily. 30 tablet 6  . isosorbide mononitrate (IMDUR) 60 MG 24 hr tablet Take 1 tablet (60 mg total) by mouth daily. 30 tablet 2  . simvastatin (ZOCOR) 20 MG tablet Take 1 tablet (20 mg total) by mouth daily at 6 PM. 30 tablet 2  . warfarin (COUMADIN) 5 MG tablet Take  2 tablets (10 mg total) by mouth daily. Except on mondays and wednesdays when you will take one and a half tabs (7.5mg ) 60 tablet 1   No current facility-administered medications on file prior to visit.      Review of Systems General: negative for fever, weight loss, appetite change Eyes: no visual symptoms. ENT: no ear symptoms, no sinus tenderness, no nasal congestion or sore throat. Neck: no pain  Respiratory: no wheezing, shortness of breath, cough Cardiovascular: no chest pain, no dyspnea on exertion, no pedal edema, no orthopnea. Gastrointestinal: no abdominal pain, no diarrhea, no constipation Genito-Urinary: no urinary frequency, no dysuria, no polyuria. Hematologic: no bruising Endocrine: no cold or heat intolerance Neurological: + headaches, no seizures, no  tremors Musculoskeletal: no joint pains, no joint swelling Skin: no pruritus, no rash. Psychological: no depression, no anxiety,      Objective: Filed Vitals:   12/11/14 1055  BP: 140/89  Pulse: 87  Temp: 98.7 F (37.1 C)  TempSrc: Oral  Resp: 18  Height:  (1.803 m)  Weight: 260 lb (117.935 kg)  SpO2: 98%      Physical Exam  Constitutional: normal appearing,  Neck: normal range of motion, no thyromegaly, no JVD Cardiovascular: normal rate and rhythm, normal heart sounds, no murmurs, rub or gallop, no pedal edema Respiratory: clear to auscultation bilaterally, no wheezes, no rales, no rhonchi Abdomen: soft, not tender to palpation, normal bowel sounds, no enlarged organs Extremities: Full ROM, no tenderness in joints Skin: warm and dry, no lesions. Neurological: alert, oriented x3, cranial nerves I-XII grossly intact , normal motor strength, normal sensation. Psychological: normal mood.         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  Hyperkalemia: Resolved.  LV Thrombus: INR is therapeutic at 2.4; continue current dose of 7.5 mg of Coumadin on Monday and Wednesday and 10 mg on other days. . Repeat INR in 1 week at his next visit.   Congestive heart failure: EF 35% Not in acute failure, weight is stable since the last visit Continue Coreg, Imdur Continue daily weight checks, limit fluids to 2 L per day, low-sodium diet.  CVA: No residual deficits.  CK D: Stage III. Creatinine functions stable at 1.7.  Hypertension: Controlled. Discontinue hydralazine due to complaints of headache and I will place him on lisinopril which is easier with regards to the dosing and also since his  creatinine has improved from 2 to about 1.7.  This note has been created with Education officer, environmental. Any transcriptional errors are unintentional.

## 2014-12-15 ENCOUNTER — Telehealth: Payer: Self-pay

## 2014-12-15 NOTE — Telephone Encounter (Signed)
Call placed to the patient to check on his status and compliance w/ his coumadin.  He said that he is " feeling a lot better" and his breathing is "great."  He noted that he is checking his weight every morning and it was 260 lbs this morning which is what it has been. He also reported that he is trying to watch his fluid intake.  He said that he has his coumadin and he has been taking it.  He noted that he takes 7.5 mg  on Monday and Wednesday and 10 mg on other days. He stated that he had no other questions /concerns. No signs of bleeding reported. Reminded him that he has an appointment on 12/20/14 @ 1400 w/ Dr Venetia Night.

## 2014-12-19 ENCOUNTER — Telehealth: Payer: Self-pay

## 2014-12-19 NOTE — Telephone Encounter (Signed)
Attempted to contact the patient to remind him of his appointment tomorrow, 12/19/14 at [facility] @ 1400 and to check on his status.  Call placed to is cell # [phone] and the voice mail box is full. Call then placed to # [phone] and his mother, Meriam Sprague, answered and said he was out but she would have him return the call to the [place] [facility].

## 2014-12-20 ENCOUNTER — Other Ambulatory Visit: Payer: Self-pay

## 2014-12-20 ENCOUNTER — Ambulatory Visit: Payer: Medicaid Other | Attending: Family Medicine | Admitting: Family Medicine

## 2014-12-20 ENCOUNTER — Encounter: Payer: Self-pay | Admitting: Family Medicine

## 2014-12-20 ENCOUNTER — Ambulatory Visit (HOSPITAL_BASED_OUTPATIENT_CLINIC_OR_DEPARTMENT_OTHER): Payer: Medicaid Other

## 2014-12-20 VITALS — BP 133/85 | HR 81 | Temp 98.8°F | Ht 71.0 in | Wt 263.4 lb

## 2014-12-20 DIAGNOSIS — Z7901 Long term (current) use of anticoagulants: Secondary | ICD-10-CM | POA: Insufficient documentation

## 2014-12-20 DIAGNOSIS — I10 Essential (primary) hypertension: Secondary | ICD-10-CM

## 2014-12-20 DIAGNOSIS — I428 Other cardiomyopathies: Secondary | ICD-10-CM

## 2014-12-20 DIAGNOSIS — Z79899 Other long term (current) drug therapy: Secondary | ICD-10-CM | POA: Diagnosis not present

## 2014-12-20 DIAGNOSIS — Z8673 Personal history of transient ischemic attack (TIA), and cerebral infarction without residual deficits: Secondary | ICD-10-CM | POA: Diagnosis not present

## 2014-12-20 DIAGNOSIS — I517 Cardiomegaly: Secondary | ICD-10-CM

## 2014-12-20 DIAGNOSIS — N189 Chronic kidney disease, unspecified: Secondary | ICD-10-CM

## 2014-12-20 DIAGNOSIS — I129 Hypertensive chronic kidney disease with stage 1 through stage 4 chronic kidney disease, or unspecified chronic kidney disease: Secondary | ICD-10-CM | POA: Insufficient documentation

## 2014-12-20 DIAGNOSIS — I669 Occlusion and stenosis of unspecified cerebral artery: Secondary | ICD-10-CM | POA: Insufficient documentation

## 2014-12-20 DIAGNOSIS — N183 Chronic kidney disease, stage 3 unspecified: Secondary | ICD-10-CM

## 2014-12-20 DIAGNOSIS — I6310 Cerebral infarction due to embolism of unspecified precerebral artery: Secondary | ICD-10-CM

## 2014-12-20 DIAGNOSIS — E785 Hyperlipidemia, unspecified: Secondary | ICD-10-CM | POA: Insufficient documentation

## 2014-12-20 DIAGNOSIS — I351 Nonrheumatic aortic (valve) insufficiency: Secondary | ICD-10-CM | POA: Insufficient documentation

## 2014-12-20 DIAGNOSIS — I340 Nonrheumatic mitral (valve) insufficiency: Secondary | ICD-10-CM

## 2014-12-20 DIAGNOSIS — I668 Occlusion and stenosis of other cerebral arteries: Secondary | ICD-10-CM | POA: Diagnosis present

## 2014-12-20 DIAGNOSIS — I5042 Chronic combined systolic (congestive) and diastolic (congestive) heart failure: Secondary | ICD-10-CM | POA: Insufficient documentation

## 2014-12-20 DIAGNOSIS — Z87891 Personal history of nicotine dependence: Secondary | ICD-10-CM | POA: Insufficient documentation

## 2014-12-20 DIAGNOSIS — I213 ST elevation (STEMI) myocardial infarction of unspecified site: Secondary | ICD-10-CM

## 2014-12-20 DIAGNOSIS — I513 Intracardiac thrombosis, not elsewhere classified: Secondary | ICD-10-CM

## 2014-12-20 DIAGNOSIS — I429 Cardiomyopathy, unspecified: Secondary | ICD-10-CM

## 2014-12-20 LAB — POCT INR: INR: 2.9
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
## 2014-12-20 NOTE — Progress Notes (Signed)
Follow up TCC Patient states he has not picked up his lisinopril Pressure today is 133.85 pulse 81 No other complaints

## 2014-12-20 NOTE — Progress Notes (Unsigned)
2D Echocardiogram Complete.  12/20/2014   Shawn [name], RDCS   Preliminary Technician Findings:  The Ejection Fraction is still reduced in the 30-35% range as seen in prior echo (11-18-14).  No Thrombus was clearly identified, although Shawn [name] declined the use of Definity Contrast.  I explained that with his function being decreased and his prior thrombus, it would be recommended to use contrast to rule out any residual thrombus, but he still decided against it.  There is at least moderate Mitral Regurgitation by color flow, however because it is eccentric, a full jet was not able to be obtained.

## 2014-12-29 ENCOUNTER — Telehealth: Payer: Self-pay

## 2014-12-29 NOTE — Telephone Encounter (Signed)
Received return call from patient who asked this Case Manager to call him back at [phone]. Return call placed to [phone]. Patient unavailable, voicemail box full so unable to leave message.

## 2014-12-29 NOTE — Telephone Encounter (Signed)
This Case Manager placed call to patient to check on status, to ensure he has all needed medications, and to remind him of upcoming appointments at [facility] (01/02/15 at 1030 with Financial Counselor), 01/11/15 @ 1500 with Dr. Armen Pickup, 01/11/15 @1530  with Cou[facility]in Clinic). Unable to reach patient; voicemail left requesting return call.

## 2015-01-02 ENCOUNTER — Ambulatory Visit: Payer: Self-pay | Attending: Family Medicine

## 2015-01-05 ENCOUNTER — Other Ambulatory Visit (HOSPITAL_COMMUNITY): Payer: Self-pay

## 2015-01-11 ENCOUNTER — Ambulatory Visit: Payer: Self-pay | Admitting: Family Medicine

## 2015-01-11 ENCOUNTER — Ambulatory Visit: Payer: Self-pay

## 2015-01-12 ENCOUNTER — Ambulatory Visit: Payer: Self-pay | Admitting: Cardiovascular Disease

## 2015-01-16 ENCOUNTER — Telehealth: Payer: Self-pay

## 2015-01-16 NOTE — Telephone Encounter (Signed)
Attempted to contact the patient to check on his status and to inquire why he missed his appointment at [facility] on 01/11/15. Call placed to # [phone] (H) - his mother answered and said that she would have him call the clinic at # [phone]. Call then placed to # [phone] (M) and the voice mail box was full.

## 2015-02-20 ENCOUNTER — Telehealth: Payer: Self-pay

## 2015-02-20 NOTE — Telephone Encounter (Signed)
Called the patient to check on his status and to confirm his visit tomorrow, 02/21/15 @ 1215. He said that he will be at the appointment and he has transportation.  He said that he is feeling " all right" and he has no problems breathing and has not had any chest pain.  He noted that he is still on coumadin and will need to have an INR done when he is at [facility]. He reported no s/s of bleeding and stated that he does not have any problems/questions at this time.  Instructed him to bring his medications with him to the appointment for review and he verbalized understanding.

## 2015-02-21 ENCOUNTER — Ambulatory Visit: Payer: Medicaid Other | Attending: Family Medicine | Admitting: Family Medicine

## 2015-02-21 ENCOUNTER — Encounter: Payer: Self-pay | Admitting: Family Medicine

## 2015-02-21 VITALS — BP 100/67 | HR 69 | Temp 98.6°F | Resp 16 | Ht 71.0 in | Wt 271.0 lb

## 2015-02-21 DIAGNOSIS — I5042 Chronic combined systolic (congestive) and diastolic (congestive) heart failure: Secondary | ICD-10-CM

## 2015-02-21 DIAGNOSIS — Z87891 Personal history of nicotine dependence: Secondary | ICD-10-CM | POA: Diagnosis not present

## 2015-02-21 DIAGNOSIS — I10 Essential (primary) hypertension: Secondary | ICD-10-CM

## 2015-02-21 DIAGNOSIS — I513 Intracardiac thrombosis, not elsewhere classified: Secondary | ICD-10-CM

## 2015-02-21 DIAGNOSIS — I213 ST elevation (STEMI) myocardial infarction of unspecified site: Secondary | ICD-10-CM

## 2015-02-21 DIAGNOSIS — N183 Chronic kidney disease, stage 3 unspecified: Secondary | ICD-10-CM

## 2015-02-21 DIAGNOSIS — I130 Hypertensive heart and chronic kidney disease with heart failure and stage 1 through stage 4 chronic kidney disease, or unspecified chronic kidney disease: Secondary | ICD-10-CM | POA: Insufficient documentation

## 2015-02-21 DIAGNOSIS — Z7901 Long term (current) use of anticoagulants: Secondary | ICD-10-CM | POA: Diagnosis not present

## 2015-02-21 DIAGNOSIS — Z79899 Other long term (current) drug therapy: Secondary | ICD-10-CM | POA: Diagnosis not present

## 2015-02-21 DIAGNOSIS — Z5181 Encounter for therapeutic drug level monitoring: Secondary | ICD-10-CM

## 2015-02-21 LAB — COMPLETE METABOLIC PANEL WITH GFR
ALT: 21 U/L (ref 9–46)
AST: 19 U/L (ref 10–40)
Albumin: 4.1 g/dL (ref 3.6–5.1)
Alkaline Phosphatase: 42 U/L (ref 40–115)
BILIRUBIN TOTAL: 0.5 mg/dL (ref 0.2–1.2)
BUN: 24 mg/dL (ref 7–25)
CHLORIDE: 106 mmol/L (ref 98–110)
CO2: 27 mmol/L (ref 20–31)
CREATININE: 1.53 mg/dL — AB (ref 0.60–1.35)
Calcium: 8.9 mg/dL (ref 8.6–10.3)
GFR, EST AFRICAN AMERICAN: 70 mL/min (ref >=60)
GFR, Est Non African American: 60 mL/min (ref >=60)
GLUCOSE: 91 mg/dL (ref 65–99)
Potassium: 4.8 mmol/L (ref 3.5–5.3)
SODIUM: 138 mmol/L (ref 135–146)
TOTAL PROTEIN: 6.8 g/dL (ref 6.1–8.1)

## 2015-02-21 LAB — POCT INR: INR: 1.6

## 2015-02-21 MED ORDER — WARFARIN SODIUM 5 MG PO TABS: 10.0000 mg | ORAL_TABLET | Freq: Every day | ORAL | Status: DC

## 2015-02-21 MED ORDER — CARVEDILOL 25 MG PO TABS: 25.0000 mg | ORAL_TABLET | Freq: Two times a day (BID) | ORAL | Status: DC

## 2015-02-21 NOTE — Assessment & Plan Note (Signed)
A: declining EF. Decline in BP w/o symptoms.  Patient missed f/u with his cardiologist but has rescheduled.  P; Continue current pharmacotherapy Close cardiology f/u

## 2015-02-21 NOTE — Assessment & Plan Note (Deleted)
A: declining EF. Decline in BP w/o symptoms.  Patient missed f/u with his cardiologist but has rescheduled.  P; Continue current pharmacotherapy Close cardiology f/u  

## 2015-02-21 NOTE — Progress Notes (Signed)
F/U HTN  INR check  No Hx tobacco  No pain today

## 2015-02-21 NOTE — Patient Instructions (Addendum)
Shawn [name] was seen today for hypertension and anticoagulation.  Diagnoses and all orders for this visit:  Encounter for monitoring coumadin therapy -     INR -     warfarin (COUMADIN) 5 MG tablet; Take 2 tablets (10 mg total) by mouth daily.  LV (left ventricular) mural thrombus (HCC) -     warfarin (COUMADIN) 5 MG tablet; Take 2 tablets (10 mg total) by mouth daily.   Goal INR 2-3 Increase coumadin dose  F/u next week Tuesday or Thursday for INR check with coumadin clinic  F/u with me in 2 months for HTN, CKD, CHF    Dr. Armen Pickup

## 2015-02-21 NOTE — Assessment & Plan Note (Signed)
A: normotensive on current regimen P: Continue current regimen

## 2015-02-21 NOTE — Progress Notes (Signed)
Patient ID: Shawn [name], male   DOB: [birth year], 29 y.o.   MRN: [id number]   Subjective:  Patient ID: Shawn [name], male    DOB: 1985-09-19  Age: 29 y.o. MRN: [id number]  CC: Hypertension and Anticoagulation   HPI SYE SCHROEPFER presents for   1. CHRONIC HYPERTENSION in setting of systolic CHF   Disease Monitoring  Blood pressure range: not checking   Chest pain: no   Dyspnea: no   Claudication: no   Medication compliance: yes  Medication Side Effects  Lightheadedness: no   Urinary frequency: no   Edema: no     2. L mural thrombus: compliant with coumadin. Not careful with diet. No bleeding or bruising.   Social History  Substance Use Topics  . Smoking status: Former Smoker -- 0.50 packs/day for 10 years    Types: Cigarettes    Quit date: 04/19/2014  . Smokeless tobacco: Never Used  . Alcohol Use: 0.0 oz/week    0 Standard drinks or equivalent per week     Comment: 3 beers per week    Outpatient Prescriptions Prior to Visit  Medication Sig Dispense Refill  . amLODipine (NORVASC) 5 MG tablet Take 1 tablet (5 mg total) by mouth daily. 30 tablet 3  . carvedilol (COREG) 6.25 MG tablet Take 1 tablet (6.25 mg total) by mouth 2 (two) times daily with a meal. 60 tablet 0  . furosemide (LASIX) 40 MG tablet Take 0.5 tablets (20 mg total) by mouth daily. 30 tablet 6  . isosorbide mononitrate (IMDUR) 60 MG 24 hr tablet Take 1 tablet (60 mg total) by mouth daily. 30 tablet 2  . lisinopril (PRINIVIL,ZESTRIL) 10 MG tablet Take 1 tablet (10 mg total) by mouth daily. 30 tablet 3  . simvastatin (ZOCOR) 20 MG tablet Take 1 tablet (20 mg total) by mouth daily at 6 PM. 30 tablet 2  . warfarin (COUMADIN) 5 MG tablet Take 2 tablets (10 mg total) by mouth daily. Except on mondays and wednesdays when you will take one and a half tabs (7.5mg ) 60 tablet 1   No facility-administered medications prior to visit.    ROS Review of Systems  Constitutional: Negative for fever, chills,  fatigue and unexpected weight change.  Eyes: Negative for visual disturbance.  Respiratory: Negative for cough and shortness of breath.   Cardiovascular: Negative for chest pain, palpitations and leg swelling.  Gastrointestinal: Negative for nausea, vomiting, abdominal pain, diarrhea, constipation and blood in stool.  Endocrine: Negative for polydipsia, polyphagia and polyuria.  Musculoskeletal: Negative for myalgias, back pain, arthralgias, gait problem and neck pain.  Skin: Negative for rash.  Allergic/Immunologic: Negative for immunocompromised state.  Neurological: Negative for dizziness and light-headedness.  Hematological: Negative for adenopathy. Does not bruise/bleed easily.  Psychiatric/Behavioral: Negative for suicidal ideas, sleep disturbance and dysphoric mood. The patient is not nervous/anxious.     Objective:  BP 100/67 mmHg  Pulse 69  Temp(Src) 98.6 F (37 C) (Oral)  Resp 16  Ht  (1.803 m)  Wt 271 lb (122.925 kg)  BMI 37.81 kg/m2  SpO2 99%  BP/Weight 02/21/2015 12/20/2014 12/11/2014  Systolic BP 100 133 140  Diastolic BP 67 85 89  Wt. (Lbs) 271 263.4 260  BMI 37.81 36.75 36.28   Physical Exam  Constitutional: He appears well-developed and well-nourished. No distress.  HENT:  Head: Normocephalic and atraumatic.  Neck: Normal range of motion. Neck supple.  Cardiovascular: Normal rate, regular rhythm, normal heart sounds and intact distal pulses.  Pulmonary/Chest: Effort normal and breath sounds normal.  Musculoskeletal: He exhibits no edema.  Neurological: He is alert.  Skin: Skin is warm and dry. No rash noted. No erythema.  Psychiatric: He has a normal mood and affect.   INR 1.6   Assessment & Plan:   Problem List Items Addressed This Visit    Chronic combined systolic and diastolic CHF (congestive heart failure) (HCC) (Chronic)    A: declining EF. Decline in BP w/o symptoms.  Patient missed f/u with his cardiologist but has rescheduled.   P; Continue current pharmacotherapy Close cardiology f/u       Relevant Medications   warfarin (COUMADIN) 5 MG tablet   carvedilol (COREG) 25 MG tablet   CKD (chronic kidney disease) stage 3, GFR 30-59 ml/min (Chronic)   Relevant Orders   COMPLETE METABOLIC PANEL WITH GFR   Encounter for monitoring coumadin therapy (Chronic)   Relevant Medications   warfarin (COUMADIN) 5 MG tablet   Other Relevant Orders   INR (Completed)   Hypertension - Primary (Chronic)    A: normotensive on current regimen P: Continue current regimen       Relevant Medications   warfarin (COUMADIN) 5 MG tablet   carvedilol (COREG) 25 MG tablet   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    Follow-up: No Follow-up on file.   Dessa Phi MD

## 2015-02-23 ENCOUNTER — Telehealth: Payer: Self-pay | Admitting: Family Medicine

## 2015-02-23 NOTE — Telephone Encounter (Signed)
Patient called requesting his blood work results. Please f/u.

## 2015-02-27 ENCOUNTER — Ambulatory Visit: Payer: Self-pay | Attending: Family Medicine | Admitting: Pharmacist

## 2015-02-27 DIAGNOSIS — I513 Intracardiac thrombosis, not elsewhere classified: Secondary | ICD-10-CM

## 2015-02-27 LAB — POCT INR: INR: 2.3

## 2015-02-28 ENCOUNTER — Other Ambulatory Visit: Payer: Self-pay

## 2015-03-01 NOTE — Telephone Encounter (Signed)
LVM with results    Geroge Crisman authorizes all [facility] practices to release information to [place]([phone]..okay to leave a detailed message on cell phone voicemail.)   Renal function improved, CR down to 1.5 continue medication as prescribed  If any question return call

## 2015-03-01 NOTE — Telephone Encounter (Signed)
-----   Message from Dessa Phi, MD sent at 02/23/2015  9:03 AM EDT ----- Renal function improved, Cr down to to 1.5 continue current regimen

## 2015-03-15 ENCOUNTER — Ambulatory Visit (INDEPENDENT_AMBULATORY_CARE_PROVIDER_SITE_OTHER): Payer: Self-pay | Admitting: Cardiovascular Disease

## 2015-03-15 ENCOUNTER — Encounter: Payer: Self-pay | Admitting: Cardiovascular Disease

## 2015-03-15 VITALS — BP 138/98 | HR 72 | Ht 71.0 in | Wt 271.0 lb

## 2015-03-15 DIAGNOSIS — I513 Intracardiac thrombosis, not elsewhere classified: Secondary | ICD-10-CM

## 2015-03-15 DIAGNOSIS — I213 ST elevation (STEMI) myocardial infarction of unspecified site: Secondary | ICD-10-CM

## 2015-03-15 DIAGNOSIS — I5022 Chronic systolic (congestive) heart failure: Secondary | ICD-10-CM

## 2015-03-15 DIAGNOSIS — I130 Hypertensive heart and chronic kidney disease with heart failure and stage 1 through stage 4 chronic kidney disease, or unspecified chronic kidney disease: Secondary | ICD-10-CM

## 2015-03-15 DIAGNOSIS — N183 Chronic kidney disease, stage 3 unspecified: Secondary | ICD-10-CM

## 2015-03-15 DIAGNOSIS — I428 Other cardiomyopathies: Secondary | ICD-10-CM

## 2015-03-15 DIAGNOSIS — I5042 Chronic combined systolic (congestive) and diastolic (congestive) heart failure: Secondary | ICD-10-CM

## 2015-03-15 DIAGNOSIS — I429 Cardiomyopathy, unspecified: Secondary | ICD-10-CM

## 2015-03-15 DIAGNOSIS — I129 Hypertensive chronic kidney disease with stage 1 through stage 4 chronic kidney disease, or unspecified chronic kidney disease: Secondary | ICD-10-CM

## 2015-03-15 DIAGNOSIS — I509 Heart failure, unspecified: Secondary | ICD-10-CM

## 2015-03-15 DIAGNOSIS — E669 Obesity, unspecified: Secondary | ICD-10-CM
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
## 2015-03-15 NOTE — Patient Instructions (Signed)
TAKE YOUR CARVEDILOL 25 MG TWICE DAILY.  Your physician has requested that you have an echocardiogram January 2017. Echocardiography is a painless test that uses sound waves to create images of your heart. It provides your doctor with information about the size and shape of your heart and how well your heart's chambers and valves are working. This procedure takes approximately one hour. There are no restrictions for this procedure.  Your physician recommends that you schedule a follow-up appointment in: 4-6 WEEKS WITH A PA/NP OR KRISTIN  Dr. Royann Shivers recommends that you schedule a follow-up appointment in: January 2017 FOLLOWING THE ECHOCARDIOGRAM

## 2015-03-20 ENCOUNTER — Ambulatory Visit: Payer: Self-pay | Attending: Family Medicine | Admitting: Pharmacist

## 2015-03-20 DIAGNOSIS — I513 Intracardiac thrombosis, not elsewhere classified: Secondary | ICD-10-CM

## 2015-03-20 LAB — POCT INR: INR: 1.9

## 2015-04-03 ENCOUNTER — Encounter: Payer: Self-pay | Admitting: Pharmacist

## 2015-04-23 ENCOUNTER — Ambulatory Visit (INDEPENDENT_AMBULATORY_CARE_PROVIDER_SITE_OTHER): Payer: Self-pay | Admitting: Cardiology

## 2015-04-23 ENCOUNTER — Ambulatory Visit (INDEPENDENT_AMBULATORY_CARE_PROVIDER_SITE_OTHER): Payer: Self-pay | Admitting: Pharmacist Clinician (PhC)/ Clinical Pharmacy Specialist

## 2015-04-23 ENCOUNTER — Encounter: Payer: Self-pay | Admitting: Cardiology

## 2015-04-23 VITALS — BP 122/80 | HR 69 | Ht 71.0 in | Wt 275.1 lb

## 2015-04-23 DIAGNOSIS — I429 Cardiomyopathy, unspecified: Secondary | ICD-10-CM

## 2015-04-23 DIAGNOSIS — I213 ST elevation (STEMI) myocardial infarction of unspecified site: Secondary | ICD-10-CM

## 2015-04-23 DIAGNOSIS — N183 Chronic kidney disease, stage 3 unspecified: Secondary | ICD-10-CM

## 2015-04-23 DIAGNOSIS — Z79899 Other long term (current) drug therapy: Secondary | ICD-10-CM

## 2015-04-23 DIAGNOSIS — I119 Hypertensive heart disease without heart failure: Secondary | ICD-10-CM | POA: Insufficient documentation

## 2015-04-23 DIAGNOSIS — I10 Essential (primary) hypertension: Secondary | ICD-10-CM

## 2015-04-23 LAB — POCT INR: INR: 1.9

## 2015-04-23 MED ORDER — LISINOPRIL 20 MG PO TABS: 20.0000 mg | ORAL_TABLET | Freq: Every day | ORAL | Status: DC

## 2015-04-23 NOTE — Patient Instructions (Signed)
Medication Instructions:  INCREASE Lisinopril to 20 mg daily  >>You make take 2 (two) 10 mg tablets until your current prescription runs out.  >>An updated prescription has been sent to your pharmacy electronically.  Labwork: Your physician recommends that you return for lab work SAME DAY AS ECHOCARDIOGRAM AT [facility].  Testing/Procedures: Please schedule the echocardiogram (ultrasound of your heart) for sometime in JANUARY 2017.  Follow-Up: Your physician recommends that you schedule a blood pressure check with the pharmacist at [facility] SAME DAY AS ECHO.  Corine Shelter, PA-C, recommends that you schedule a follow-up appointment after your echo with Dr Royann Shivers.  If you need a refill on your cardiac medications before your next appointment, please call your pharmacy.   [place] location  >>[address], [address]  >>[phone]

## 2015-04-23 NOTE — Progress Notes (Signed)
04/23/2015 Shawn [name]   10-17-1985  [id number]  Primary Physician Lora Paula, MD Primary Cardiologist: Dr Royann Shivers  HPI:  29 y/o AA male with a history of HTN, HCVD, NICM, CRI-3,  embolic CVA July 2016- on Coumadin x 12 months for LA thrombus, seen in follow up for medication titration. The pt works unloading boxes. He is asymptomatic. He say Dr Royann Shivers in Oct and his Coreg was increased to BID (pt was only taking it QD).    Current Outpatient Prescriptions  Medication Sig Dispense Refill  . amLODipine (NORVASC) 5 MG tablet Take 1 tablet (5 mg total) by mouth daily. 30 tablet 3  . carvedilol (COREG) 25 MG tablet Take 1 tablet (25 mg total) by mouth 2 (two) times daily with a meal. 60 tablet 2  . furosemide (LASIX) 40 MG tablet Take 0.5 tablets (20 mg total) by mouth daily. 30 tablet 6  . isosorbide mononitrate (IMDUR) 60 MG 24 hr tablet Take 1 tablet (60 mg total) by mouth daily. 30 tablet 2  . lisinopril (PRINIVIL,ZESTRIL) 20 MG tablet Take 1 tablet (20 mg total) by mouth daily. 30 tablet 11  . simvastatin (ZOCOR) 20 MG tablet Take 1 tablet (20 mg total) by mouth daily at 6 PM. 30 tablet 2  . warfarin (COUMADIN) 5 MG tablet Take 2 tablets (10 mg total) by mouth daily. 60 tablet 1   No current facility-administered medications for this visit.    No Known Allergies  Social History   Social History  . Marital Status: Single    Spouse Name: N/A  . Number of Children: N/A  . Years of Education: N/A   Occupational History  . Not on file.   Social History Main Topics  . Smoking status: Former Smoker -- 0.50 packs/day for 10 years    Types: Cigarettes    Quit date: 04/19/2014  . Smokeless tobacco: Never Used  . Alcohol Use: 0.0 oz/week    0 Standard drinks or equivalent per week     Comment: 3 beers per week   . Drug Use: No     Comment: Last used marijuana 02/18/2015  . Sexual Activity: Not on file   Other Topics Concern  . Not on file   Social History  Narrative     Review of Systems: General: negative for chills, fever, night sweats or weight changes.  Cardiovascular: negative for chest pain, dyspnea on exertion, edema, orthopnea, palpitations, paroxysmal nocturnal dyspnea or shortness of breath Dermatological: negative for rash Respiratory: negative for cough or wheezing Urologic: negative for hematuria Abdominal: negative for nausea, vomiting, diarrhea, bright red blood per rectum, melena, or hematemesis Neurologic: negative for visual changes, syncope, or dizziness All other systems reviewed and are otherwise negative except as noted above.    Blood pressure 122/80, pulse 69, height 5\' 11"  (1.803 m), weight 275 lb 1.6 oz (124.785 kg).  General appearance: alert, cooperative, no distress and mildly obese Neck: no carotid bruit and no JVD Lungs: clear to auscultation bilaterally Heart: regular rate and rhythm Extremities: no edema  EKG NSR, IVCD- QRS duration= 118 ms.   ASSESSMENT AND PLAN:   Nonischemic cardiomyopathy (HCC) NICM secondary to HTN HCVD. Last EF 20% Aug 2016.   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 Check BMP, B/P, and echo in Jan then f/u with Dr Royann Shivers. I did have them check an INR today- (he did not show at the clinic for his Nov 15 INR check).   Rukiya Hodgkins K PA-C 04/23/2015 11:35 AM

## 2015-04-23 NOTE — Assessment & Plan Note (Addendum)
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

## 2015-04-23 NOTE — Assessment & Plan Note (Signed)
On Coumadin for a minimum of 12 months

## 2015-04-23 NOTE — Assessment & Plan Note (Signed)
Controlled.  

## 2015-04-23 NOTE — Assessment & Plan Note (Signed)
Grade 2 DD with EF 20%

## 2015-04-23 NOTE — Assessment & Plan Note (Signed)
Last Scr 1.53 Oct 2016

## 2015-04-23 NOTE — Assessment & Plan Note (Signed)
NICM secondary to HTN HCVD. Last EF 20% Aug 2016.

## 2015-05-08 ENCOUNTER — Ambulatory Visit: Payer: Self-pay | Attending: Family Medicine | Admitting: Pharmacist

## 2015-05-08 DIAGNOSIS — I513 Intracardiac thrombosis, not elsewhere classified: Secondary | ICD-10-CM

## 2015-05-08 LAB — POCT INR: INR: 2

## 2015-05-29 ENCOUNTER — Ambulatory Visit: Payer: Self-pay | Admitting: Pharmacist

## 2015-05-29 ENCOUNTER — Other Ambulatory Visit: Payer: Self-pay

## 2015-05-29 ENCOUNTER — Ambulatory Visit (HOSPITAL_COMMUNITY): Payer: Medicaid Other | Attending: Cardiology

## 2015-05-29 DIAGNOSIS — E785 Hyperlipidemia, unspecified: Secondary | ICD-10-CM | POA: Insufficient documentation

## 2015-05-29 DIAGNOSIS — I10 Essential (primary) hypertension: Secondary | ICD-10-CM | POA: Insufficient documentation

## 2015-05-29 DIAGNOSIS — I5022 Chronic systolic (congestive) heart failure: Secondary | ICD-10-CM | POA: Diagnosis not present

## 2015-05-29 DIAGNOSIS — I517 Cardiomegaly: Secondary | ICD-10-CM | POA: Diagnosis not present

## 2015-05-29 DIAGNOSIS — I213 ST elevation (STEMI) myocardial infarction of unspecified site: Secondary | ICD-10-CM

## 2015-05-29 DIAGNOSIS — Z87891 Personal history of nicotine dependence: Secondary | ICD-10-CM | POA: Diagnosis not present

## 2015-05-29 DIAGNOSIS — I5021 Acute systolic (congestive) heart failure: Secondary | ICD-10-CM | POA: Diagnosis present

## 2015-05-29 DIAGNOSIS — I513 Intracardiac thrombosis, not elsewhere classified: Secondary | ICD-10-CM

## 2015-05-30 ENCOUNTER — Telehealth: Payer: Self-pay | Admitting: Cardiovascular Disease

## 2015-05-30 NOTE — Telephone Encounter (Signed)
Pt would like his echo results please. °

## 2015-05-30 NOTE — Telephone Encounter (Signed)
Returned call to patient Echo results given. 

## 2015-05-31 ENCOUNTER — Ambulatory Visit: Payer: Self-pay | Attending: Family Medicine | Admitting: Pharmacist

## 2015-05-31 DIAGNOSIS — I513 Intracardiac thrombosis, not elsewhere classified: Secondary | ICD-10-CM

## 2015-05-31 LAB — POCT INR: INR: 1.5

## 2015-06-07 ENCOUNTER — Ambulatory Visit: Payer: Self-pay | Attending: Family Medicine | Admitting: Pharmacist

## 2015-06-07 DIAGNOSIS — I513 Intracardiac thrombosis, not elsewhere classified: Secondary | ICD-10-CM

## 2015-06-07 LAB — POCT INR: INR: 2

## 2015-06-07 MED FILL — SIMVASTATIN 20 MG TABLET: 20 | 30 days supply | Qty: 30 | Fill #1

## 2015-06-07 MED FILL — ?WARFARIN SODIUM 5 MG TABLE: 5 | 30 days supply | Qty: 60 | Fill #1

## 2015-06-07 MED FILL — ?FUROSEMIDE 40 MG TABLET: 40 | 30 days supply | Qty: 15 | Fill #7

## 2015-06-14 ENCOUNTER — Encounter: Payer: Self-pay | Admitting: Pharmacist

## 2015-06-25 ENCOUNTER — Other Ambulatory Visit: Payer: Self-pay | Admitting: Family Medicine

## 2015-06-25 MED FILL — ?AMLODIPINE BESYLATE 5 MG T: 5 | 30 days supply | Qty: 30 | Fill #0

## 2015-06-25 MED FILL — ?CARVEDILOL 25 MG TABLET: 25 | 30 days supply | Qty: 60 | Fill #1

## 2015-06-25 MED FILL — LISINOPRIL 20 MG TABLET: 20 | 30 days supply | Qty: 30 | Fill #0

## 2015-06-27 ENCOUNTER — Ambulatory Visit (INDEPENDENT_AMBULATORY_CARE_PROVIDER_SITE_OTHER): Payer: Self-pay | Admitting: Cardiovascular Disease

## 2015-06-27 VITALS — BP 160/98 | HR 84 | Ht 71.0 in | Wt 288.4 lb

## 2015-06-27 DIAGNOSIS — E669 Obesity, unspecified: Secondary | ICD-10-CM

## 2015-06-27 DIAGNOSIS — N183 Chronic kidney disease, stage 3 unspecified: Secondary | ICD-10-CM

## 2015-06-27 DIAGNOSIS — I213 ST elevation (STEMI) myocardial infarction of unspecified site: Secondary | ICD-10-CM

## 2015-06-27 DIAGNOSIS — Z5181 Encounter for therapeutic drug level monitoring: Secondary | ICD-10-CM

## 2015-06-27 DIAGNOSIS — I130 Hypertensive heart and chronic kidney disease with heart failure and stage 1 through stage 4 chronic kidney disease, or unspecified chronic kidney disease: Secondary | ICD-10-CM

## 2015-06-27 DIAGNOSIS — I129 Hypertensive chronic kidney disease with stage 1 through stage 4 chronic kidney disease, or unspecified chronic kidney disease: Secondary | ICD-10-CM

## 2015-06-27 DIAGNOSIS — I5042 Chronic combined systolic (congestive) and diastolic (congestive) heart failure: Secondary | ICD-10-CM

## 2015-06-27 DIAGNOSIS — Z7901 Long term (current) use of anticoagulants: Secondary | ICD-10-CM

## 2015-06-27 DIAGNOSIS — I509 Heart failure, unspecified: Secondary | ICD-10-CM

## 2015-06-27 DIAGNOSIS — I513 Intracardiac thrombosis, not elsewhere classified: Secondary | ICD-10-CM

## 2015-06-27 DIAGNOSIS — E785 Hyperlipidemia, unspecified: Secondary | ICD-10-CM

## 2015-06-27 MED ORDER — SIMVASTATIN 20 MG PO TABS: 20.0000 mg | ORAL_TABLET | Freq: Every day | ORAL | Status: DC

## 2015-06-27 MED ORDER — CARVEDILOL 25 MG PO TABS: 25.0000 mg | ORAL_TABLET | Freq: Two times a day (BID) | ORAL | Status: DC

## 2015-06-27 MED ORDER — VALSARTAN 320 MG PO TABS: 320.0000 mg | ORAL_TABLET | Freq: Every day | ORAL | Status: DC

## 2015-06-27 MED ORDER — AMLODIPINE BESYLATE 5 MG PO TABS: 5.0000 mg | ORAL_TABLET | Freq: Every day | ORAL | Status: DC

## 2015-06-27 MED ORDER — FUROSEMIDE 40 MG PO TABS: 20.0000 mg | ORAL_TABLET | Freq: Every day | ORAL | Status: DC

## 2015-06-27 MED FILL — VALSARTAN 320 MG TABLET: 320 | 30 days supply | Qty: 30 | Fill #0

## 2015-06-27 NOTE — Progress Notes (Signed)
Patient ID: Shawn [name], male   DOB: 31-Dec-1985, 31 y.o.   MRN: [id number]    Cardiology Office Note    Date:  06/28/2015   ID:  Shawn [name], DOB Sep 18, 1985, MRN [id number]  PCP:  Lora Paula, MD  Cardiologist:   Thurmon Fair, MD   Chief Complaint  Patient presents with  . Follow-up    ECHO//pt states he missed a few days of taking IMDUR, when he started back he got severe headaches so stopped taking//no other Sx.    History of Present Illness:  Shawn [name] is a 30 y.o. 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 He has had some problems with noncompliance and stopped most of his meds except warfarin. When he tried to restart imdur he had severe headaches and stopped it. He also noticed that his cough was lisinopril connected. He has gained weight. INR today was therapeutic at 2.0.   He has NYHA class 1-2 dyspnea, no angina, no new neuro events and no bleeding, no leg edema or syncope.  Echo showed a marked improvement in LVEF, now 40-45%, but the LV is still moderately dilated. No LV clot was seen, no signs of high filling pressures.    Past Medical History  Diagnosis Date  . Hypertension     at age 38  . Malignant hypertension     dx age 48  . Nonischemic cardiomyopathy (HCC)   . Penile adhesions   . CKD (chronic kidney disease), stage III   . Dyspnea on exertion   . Hyperlipidemia   . At risk for sleep apnea     STOP-BANG= 4      SENT TO PCP 11-10-2014  . Systolic and diastolic CHF, chronic [facility]) dx 09-18-2014    cardiologist-  dr Rachelle Hora Sahira Cataldi----  ef 25-30% per last note 11-03-2014    Past Surgical History  Procedure Laterality Date  . Transthoracic echocardiogram  09-19-2014  dr Saraih Lorton    moderate LVH,  ef 35% (per dr Avantika Shere note, ef 25-30%), diffuse hypokinesis of basal inferior myocardium,  trivial AR,  mild  MR  . No past surgeries    . Circumcision revision N/A 11/14/2014    Procedure: CIRCUMCISION REVISION, LYSIS OF ADHESIONS;  Surgeon: Su Grand, MD;  Location: [facility];  Service: Urology;  Laterality: N/A;    Outpatient Prescriptions Prior to Visit  Medication Sig Dispense Refill  . warfarin (COUMADIN) 5 MG tablet Take 2 tablets (10 mg total) by mouth daily. 60 tablet 1  . amLODipine (NORVASC) 5 MG tablet TAKE 1 TABLET BY MOUTH DAILY. 30 tablet 2  . carvedilol (COREG) 25 MG tablet Take 1 tablet (25 mg total) by mouth 2 (two) times daily with a meal. 60 tablet 2  . furosemide (LASIX) 40 MG tablet Take 0.5 tablets (20 mg total) by mouth daily. 30 tablet 6  . lisinopril (PRINIVIL,ZESTRIL) 20 MG tablet Take 1 tablet (20 mg total) by mouth daily. 30 tablet 11  . simvastatin (ZOCOR) 20 MG tablet Take 1 tablet (20 mg total) by mouth daily at 6 PM. 30 tablet 2  . isosorbide mononitrate (IMDUR) 60 MG 24 hr tablet Take 1 tablet (60 mg total) by mouth daily. (Patient not taking: Reported on 06/27/2015) 30 tablet 2   No facility-administered medications prior to visit.     Allergies:   Isosorbide and Lisinopril   Social  History   Social History  . Marital Status: Single    Spouse Name: N/A  . Number of Children: N/A  . Years of Education: N/A   Social History Main Topics  . Smoking status: Former Smoker -- 0.50 packs/day for 10 years    Types: Cigarettes    Quit date: 04/19/2014  . Smokeless tobacco: Never Used  . Alcohol Use: 0.0 oz/week    0 Standard drinks or equivalent per week     Comment: 3 beers per week   . Drug Use: No     Comment: Last used marijuana 02/18/2015  . Sexual Activity: Not on file   Other Topics Concern  . Not on file   Social History Narrative     Family History:  The patient's family history includes Diabetes in his mother; Hypertension in his mother.   ROS:   Please see the history of present illness.    ROS All other systems reviewed  and are negative.   PHYSICAL EXAM:   VS:  BP 160/98 mmHg  Pulse 84  Ht 5\' 11"  (1.803 m)  Wt 130.817 kg (288 lb 6.4 oz)  BMI 40.24 kg/m2   GEN: Well nourished, well developed, in no acute distress HEENT: normal Neck: no JVD, carotid bruits, or masses Cardiac: laterally displace apical impulse; RRR; no murmurs, rubs, or gallops,no edema  Respiratory:  clear to auscultation bilaterally, normal work of breathing GI: soft, nontender, nondistended, + BS MS: no deformity or atrophy Skin: warm and dry, no rash Neuro:  Alert and Oriented x 3, Strength and sensation are intact Psych: euthymic mood, full affect  Wt Readings from Last 3 Encounters:  06/27/15 130.817 kg (288 lb 6.4 oz)  04/23/15 124.785 kg (275 lb 1.6 oz)  03/15/15 122.925 kg (271 lb)      Studies/Labs Reviewed:   EKG:  EKG is not ordered today.   Recent Labs: 09/18/2014: B Natriuretic Peptide 391.7* 11/18/2014: TSH 1.127 11/23/2014: Magnesium 1.7 11/25/2014: Hemoglobin 14.6; Platelets 275 02/21/2015: ALT 21; BUN 24; Creat 1.53*; Potassium 4.8; Sodium 138   Lipid Panel    Component Value Date/Time   CHOL 174 11/18/2014 0133   TRIG 136 11/18/2014 0133   HDL 33* 11/18/2014 0133   CHOLHDL 5.3 11/18/2014 0133   VLDL 27 11/18/2014 0133   LDLCALC 114* 11/18/2014 0133     ASSESSMENT:    1. Chronic combined systolic and diastolic CHF (congestive heart failure) (HCC)   2. Malignant hypertension, heart failure, and stage III chronic kidney disease (HCC)   3. LV (left ventricular) mural thrombus (HCC)   4. Encounter for monitoring coumadin therapy   5. Hyperlipidemia   6. Obesity (BMI 30-39.9)      PLAN:  In order of problems listed above:  1. CHF:  EF improved to 40-45%, will not need ICD. Appears euvolemic, good functional status on very low dose loop diuretic. Continue efforts to aggressively Rx medically. Replace ACEi with ARB due to cough. Imdur of questionable value without hydralazine - will stop it. Would  benefit from [facility] if we can get drug coverage. 2. HTN:  Continue aggressive BP reduction, target < 130/80  3. LV thrombus:  Plan to continue warfarin at least through July 2017 4. INR 2.0 today, no bleeding complications 5. Target LDL <100, no CAD 6. Obesity:  Notwithstanding "obesity CHF paradox", his degree of weight gain is clearly disadvantageous. Reinforce exercise (moderate aerobic) and dietary control.    Medication Adjustments/Labs and Tests Ordered: Current medicines are  reviewed at length with the patient today.  Concerns regarding medicines are outlined above.  Medication changes, Labs and Tests ordered today are listed in the Patient Instructions below. Patient Instructions  Your physician has recommended you make the following change in your medication:   STOP:  ISOSORBIDE and LISINOPRIL  START:  VALSARTAN 320 MG TAKE ONE TABLET BY MOUTH DAILY  Dr. Royann Shivers recommends that you schedule a follow-up appointment in: 6 MONTHS         Signed, Karson Reede, MD  06/28/2015 11:55 PM    [facility] [address] [place], [place], [place]  [address] Phone: [phone]; Fax: [phone]

## 2015-06-27 NOTE — Patient Instructions (Signed)
Your physician has recommended you make the following change in your medication:   STOP:  ISOSORBIDE and LISINOPRIL  START:  VALSARTAN 320 MG TAKE ONE TABLET BY MOUTH DAILY  Dr. Royann Shivers recommends that you schedule a follow-up appointment in: 6 MONTHS

## 2015-06-29 ENCOUNTER — Encounter: Payer: Self-pay | Admitting: Cardiovascular Disease

## 2015-07-04 ENCOUNTER — Other Ambulatory Visit: Payer: Self-pay | Admitting: Family Medicine

## 2015-07-04 MED FILL — WARFARIN SODIUM 5 MG TABLET: 5 | 30 days supply | Qty: 64 | Fill #0

## 2015-07-17 MED FILL — AMLODIPINE BESYLATE 5 MG TA: 5 | 90 days supply | Qty: 90 | Fill #0

## 2015-07-17 MED FILL — CARVEDILOL 25 MG TABLET: 25 | 90 days supply | Qty: 180 | Fill #0

## 2015-07-17 MED FILL — LISINOPRIL 20 MG TABLET: 20 | 90 days supply | Qty: 90 | Fill #1

## 2015-08-02 ENCOUNTER — Ambulatory Visit: Payer: Self-pay | Attending: Family Medicine | Admitting: Pharmacist

## 2015-08-02 DIAGNOSIS — I513 Intracardiac thrombosis, not elsewhere classified: Secondary | ICD-10-CM

## 2015-08-02 LAB — POCT INR: INR: 1.9

## 2015-08-02 MED ORDER — WARFARIN SODIUM 5 MG PO TABS: ORAL_TABLET | ORAL | Status: DC

## 2015-08-02 MED FILL — WARFARIN SODIUM 5 MG TABLET: 5 | 30 days supply | Qty: 64 | Fill #1

## 2015-08-02 MED FILL — FUROSEMIDE 40 MG TABLET: 40 | 30 days supply | Qty: 15 | Fill #0

## 2015-08-02 MED FILL — SIMVASTATIN 20 MG TABLET: 20 | 30 days supply | Qty: 30 | Fill #2

## 2015-08-09 ENCOUNTER — Encounter: Payer: Self-pay | Admitting: Pharmacist

## 2015-08-16 ENCOUNTER — Ambulatory Visit: Payer: Medicaid Other | Attending: Family Medicine | Admitting: Pharmacist

## 2015-08-16 DIAGNOSIS — I513 Intracardiac thrombosis, not elsewhere classified: Secondary | ICD-10-CM

## 2015-08-16 LAB — POCT INR: INR: 2.5

## 2015-09-03 MED FILL — WARFARIN SODIUM 5 MG TABLET: 5 | 30 days supply | Qty: 64 | Fill #2

## 2015-09-27 ENCOUNTER — Ambulatory Visit: Payer: Medicaid Other | Attending: Family Medicine | Admitting: Pharmacist

## 2015-09-27 DIAGNOSIS — I513 Intracardiac thrombosis, not elsewhere classified: Secondary | ICD-10-CM

## 2015-09-27 LAB — POCT INR: INR: 1.8

## 2015-09-27 MED FILL — FUROSEMIDE 40 MG TABLET: 40 | 30 days supply | Qty: 15 | Fill #1

## 2015-09-27 MED FILL — AMLODIPINE BESYLATE 5 MG TA: 5 | 30 days supply | Qty: 30 | Fill #1

## 2015-09-27 MED FILL — WARFARIN SODIUM 5 MG TABLET: 5 | 30 days supply | Qty: 64 | Fill #3

## 2015-09-27 MED FILL — SIMVASTATIN 20 MG TABLET: 20 | 30 days supply | Qty: 30 | Fill #0

## 2015-10-18 ENCOUNTER — Ambulatory Visit: Payer: Medicaid Other | Attending: Family Medicine | Admitting: Pharmacist

## 2015-10-18 DIAGNOSIS — I513 Intracardiac thrombosis, not elsewhere classified: Secondary | ICD-10-CM

## 2015-10-18 LAB — POCT INR: INR: 2.2

## 2015-10-31 MED FILL — SIMVASTATIN 20 MG TABLET: 20 | 30 days supply | Qty: 30 | Fill #1

## 2015-10-31 MED FILL — WARFARIN SODIUM 5 MG TABLET: 5 | 6 days supply | Qty: 14 | Fill #4

## 2015-10-31 MED FILL — AMLODIPINE BESYLATE 5 MG TA: 5 | 30 days supply | Qty: 30 | Fill #2

## 2015-10-31 MED FILL — FUROSEMIDE 40 MG TABLET: 40 | 30 days supply | Qty: 15 | Fill #2

## 2015-11-13 ENCOUNTER — Ambulatory Visit: Payer: Medicaid Other | Attending: Family Medicine | Admitting: Pharmacist

## 2015-11-13 DIAGNOSIS — I513 Intracardiac thrombosis, not elsewhere classified: Secondary | ICD-10-CM

## 2015-11-13 LAB — POCT INR: INR: 1

## 2015-11-13 MED FILL — WARFARIN SODIUM 5 MG TABLET: 5 | 30 days supply | Qty: 90 | Fill #0

## 2015-11-13 MED FILL — VALSARTAN 320 MG TABLET: 320 | 30 days supply | Qty: 30 | Fill #1

## 2015-11-26 MED FILL — AMLODIPINE BESYLATE 5 MG TA: 5 | 30 days supply | Qty: 30 | Fill #3

## 2015-12-28 MED FILL — AMLODIPINE BESYLATE 5 MG TA: 5 | 30 days supply | Qty: 30 | Fill #4

## 2015-12-28 MED FILL — CARVEDILOL 25 MG TABLET: 25 | 30 days supply | Qty: 60 | Fill #1

## 2015-12-28 MED FILL — VALSARTAN 320 MG TABLET: 320 | 30 days supply | Qty: 30 | Fill #2

## 2015-12-28 MED FILL — SIMVASTATIN 20 MG TABLET: 20 | 30 days supply | Qty: 30 | Fill #2

## 2016-01-01 MED FILL — FUROSEMIDE 20 MG TABLET: 20 | 30 days supply | Qty: 30 | Fill #0

## 2016-01-10 ENCOUNTER — Ambulatory Visit (INDEPENDENT_AMBULATORY_CARE_PROVIDER_SITE_OTHER): Payer: Medicaid Other | Admitting: Cardiovascular Disease

## 2016-01-10 ENCOUNTER — Encounter: Payer: Self-pay | Admitting: Cardiovascular Disease

## 2016-01-10 VITALS — BP 130/82 | HR 75 | Ht 71.0 in | Wt 304.4 lb

## 2016-01-10 DIAGNOSIS — N183 Chronic kidney disease, stage 3 (moderate): Secondary | ICD-10-CM

## 2016-01-10 DIAGNOSIS — I513 Intracardiac thrombosis, not elsewhere classified: Secondary | ICD-10-CM

## 2016-01-10 DIAGNOSIS — I213 ST elevation (STEMI) myocardial infarction of unspecified site: Secondary | ICD-10-CM | POA: Diagnosis not present

## 2016-01-10 DIAGNOSIS — I5022 Chronic systolic (congestive) heart failure: Secondary | ICD-10-CM

## 2016-01-10 DIAGNOSIS — I509 Heart failure, unspecified: Secondary | ICD-10-CM

## 2016-01-10 DIAGNOSIS — I130 Hypertensive heart and chronic kidney disease with heart failure and stage 1 through stage 4 chronic kidney disease, or unspecified chronic kidney disease: Secondary | ICD-10-CM

## 2016-01-10 DIAGNOSIS — I129 Hypertensive chronic kidney disease with stage 1 through stage 4 chronic kidney disease, or unspecified chronic kidney disease: Secondary | ICD-10-CM

## 2016-01-10 LAB — COMPREHENSIVE METABOLIC PANEL
ALBUMIN: 3.9 g/dL (ref 3.6–5.1)
ALT: 39 U/L (ref 9–46)
AST: 24 U/L (ref 10–40)
Alkaline Phosphatase: 40 U/L (ref 40–115)
BILIRUBIN TOTAL: 0.5 mg/dL (ref 0.2–1.2)
BUN: 17 mg/dL (ref 7–25)
CALCIUM: 9.1 mg/dL (ref 8.6–10.3)
CHLORIDE: 107 mmol/L (ref 98–110)
CO2: 26 mmol/L (ref 20–31)
Creat: 1.3 mg/dL (ref 0.60–1.35)
Glucose, Bld: 92 mg/dL (ref 65–99)
Potassium: 4.1 mmol/L (ref 3.5–5.3)
Sodium: 139 mmol/L (ref 135–146)
TOTAL PROTEIN: 6.9 g/dL (ref 6.1–8.1)

## 2016-01-10 NOTE — Patient Instructions (Signed)
Dr Royann Shiversroitoru recommends that you continue on your current medications as directed. Please refer to the Current Medication list given to you today.  Your physician has requested that you have an echocardiogram. Echocardiography is a painless test that uses sound waves to create images of your heart. It provides your doctor with information about the size and shape of your heart and how well your heart's chambers and valves are working. This procedure takes approximately one hour. There are no restrictions for this procedure.  Your physician recommends that you return for lab work at your earliest convenience.  Dr Royann Shiversroitoru recommends that you schedule a follow-up appointment in 6 months. You will receive a reminder letter in the mail two months in advance. If you don't receive a letter, please call our office to schedule the follow-up appointment.  If you need a refill on your cardiac medications before your next appointment, please call your pharmacy.   Your physician encouraged you to lose weight for better health.  Fat and Cholesterol Restricted Diet High levels of fat and cholesterol in your blood may lead to various health problems, such as diseases of the heart, blood vessels, gallbladder, liver, and pancreas. Fats are concentrated sources of energy that come in various forms. Certain types of fat, including saturated fat, may be harmful in excess. Cholesterol is a substance needed by your body in small amounts. Your body makes all the cholesterol it needs. Excess cholesterol comes from the food you eat. When you have high levels of cholesterol and saturated fat in your blood, health problems can develop because the excess fat and cholesterol will gather along the walls of your blood vessels, causing them to narrow. Choosing the right foods will help you control your intake of fat and cholesterol. This will help keep the levels of these substances in your blood within normal limits and reduce your  risk of disease. WHAT IS MY PLAN? Your health care provider recommends that you:  Get no more than __________ % of the total calories in your daily diet from fat.  Limit your intake of saturated fat to less than ______% of your total calories each day.  Limit the amount of cholesterol in your diet to less than _________mg per day. WHAT TYPES OF FAT SHOULD I CHOOSE?  Choose healthy fats more often. Choose monounsaturated and polyunsaturated fats, such as olive and canola oil, flaxseeds, walnuts, almonds, and seeds.  Eat more omega-3 fats. Good choices include salmon, mackerel, sardines, tuna, flaxseed oil, and ground flaxseeds. Aim to eat fish at least two times a week.  Limit saturated fats. Saturated fats are primarily found in animal products, such as meats, butter, and cream. Plant sources of saturated fats include palm oil, palm kernel oil, and coconut oil.  Avoid foods with partially hydrogenated oils in them. These contain trans fats. Examples of foods that contain trans fats are stick margarine, some tub margarines, cookies, crackers, and other baked goods. WHAT GENERAL GUIDELINES DO I NEED TO FOLLOW? These guidelines for healthy eating will help you control your intake of fat and cholesterol:  Check food labels carefully to identify foods with trans fats or high amounts of saturated fat.  Fill one half of your plate with vegetables and green salads.  Fill one fourth of your plate with whole grains. Look for the word "whole" as the first word in the ingredient list.  Fill one fourth of your plate with lean protein foods.  Limit fruit to two servings a day. Choose  fruit instead of juice.  Eat more foods that contain soluble fiber. Examples of foods that contain this type of fiber are apples, broccoli, carrots, beans, peas, and barley. Aim to get 20-30 g of fiber per day.  Eat more home-cooked food and less restaurant, buffet, and fast food.  Limit or avoid alcohol.  Limit  foods high in starch and sugar.  Limit fried foods.  Cook foods using methods other than frying. Baking, boiling, grilling, and broiling are all great options.  Lose weight if you are overweight. Losing just 5-10% of your initial body weight can help your overall health and prevent diseases such as diabetes and heart disease. WHAT FOODS CAN I EAT? Grains Whole grains, such as whole wheat or whole grain breads, crackers, cereals, and pasta. Unsweetened oatmeal, bulgur, barley, quinoa, or brown rice. Corn or whole wheat flour tortillas. Vegetables Fresh or frozen vegetables (raw, steamed, roasted, or grilled). Green salads. Fruits All fresh, canned (in natural juice), or frozen fruits. Meat and Other Protein Products Ground beef (85% or leaner), grass-fed beef, or beef trimmed of fat. Skinless chicken or [place]. Ground chicken or [place]. Pork trimmed of fat. All fish and seafood. Eggs. Dried beans, peas, or lentils. Unsalted nuts or seeds. Unsalted canned or dry beans. Dairy Low-fat dairy products, such as skim or 1% milk, 2% or reduced-fat cheeses, low-fat ricotta or cottage cheese, or plain low-fat yogurt. Fats and Oils Tub margarines without trans fats. Light or reduced-fat mayonnaise and salad dressings. Avocado. Olive, canola, sesame, or safflower oils. Natural peanut or almond butter (choose ones without added sugar and oil). The items listed above may not be a complete list of recommended foods or beverages. Contact your dietitian for more options. WHAT FOODS ARE NOT RECOMMENDED? Grains White bread. White pasta. White rice. Cornbread. Bagels, pastries, and croissants. Crackers that contain trans fat. Vegetables White potatoes. Corn. Creamed or fried vegetables. Vegetables in a cheese sauce. Fruits Dried fruits. Canned fruit in light or heavy syrup. Fruit juice. Meat and Other Protein Products Fatty cuts of meat. Ribs, chicken wings, bacon, sausage, bologna, salami, chitterlings,  fatback, hot dogs, bratwurst, and packaged luncheon meats. Liver and organ meats. Dairy Whole or 2% milk, cream, half-and-half, and cream cheese. Whole milk cheeses. Whole-fat or sweetened yogurt. Full-fat cheeses. Nondairy creamers and whipped toppings. Processed cheese, cheese spreads, or cheese curds. Sweets and Desserts Corn syrup, sugars, honey, and molasses. Candy. Jam and jelly. Syrup. Sweetened cereals. Cookies, pies, cakes, donuts, muffins, and ice cream. Fats and Oils Butter, stick margarine, lard, shortening, ghee, or bacon fat. Coconut, palm kernel, or palm oils. Beverages Alcohol. Sweetened drinks (such as sodas, lemonade, and fruit drinks or punches). The items listed above may not be a complete list of foods and beverages to avoid. Contact your dietitian for more information.   This information is not intended to replace advice given to you by your health care provider. Make sure you discuss any questions you have with your health care provider.   Document Released: 05/05/2005 Document Revised: 05/26/2014 Document Reviewed: 08/03/2013 Elsevier Interactive Patient Education [employer]

## 2016-01-10 NOTE — Progress Notes (Signed)
Patient ID: Shawn [name], male   DOB: [birth year], 30 y.o.   MRN: [id number]    Cardiology Office Note    Date:  01/10/2016   ID:  Shawn [name], DOB 22-Feb-1986, MRN [id number]  PCP:  Lora Paula, MD  Cardiologist:   Thurmon Fair, MD   Chief Complaint  Patient presents with  . Follow-up    pt c/o weight gain    History of Present Illness:  Shawn [name] is a 30 y.o. 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 He has been compliant with his medication and feels well. He is working as a Naval architect and does not have problems with shortness of breath when he sometimes has to help load or unload the truck. He wants to go back to working out in the gym. He has now been off warfarin for about 3 weeks. Unfortunately he has continued to gain weight and is now in the morbid obese range.   He has NYHA class 1-2 dyspnea, no angina, no new neuro events and no bleeding, no leg edema or syncope.  Last echo in January 2017 showed a marked improvement in LVEF at 40-45%, but the LV was still moderately dilated. No LV clot was seen, no signs of high filling pressures.  Past Medical History:  Diagnosis Date  . At risk for sleep apnea    STOP-BANG= 4      SENT TO PCP 11-10-2014  . CKD (chronic kidney disease), stage III   . Dyspnea on exertion   . Hyperlipidemia   . Hypertension    at age 61  . Malignant hypertension    dx age 69  . Nonischemic cardiomyopathy (HCC)   . Penile adhesions   . Systolic and diastolic CHF, chronic (HCC) dx 09-18-2014   cardiologist-  dr Rachelle Hora Corin Tilly----  ef 25-30% per last note 11-03-2014    Past Surgical History:  Procedure Laterality Date  . CIRCUMCISION REVISION N/A 11/14/2014   Procedure: CIRCUMCISION REVISION, LYSIS OF ADHESIONS;  Surgeon: Su Grand, MD;  Location: [facility];  Service: Urology;   Laterality: N/A;  . NO PAST SURGERIES    . TRANSTHORACIC ECHOCARDIOGRAM  09-19-2014  dr Syvanna Ciolino   moderate LVH,  ef 35% (per dr Bobbiejo Ishikawa note, ef 25-30%), diffuse hypokinesis of basal inferior myocardium,  trivial AR,  mild MR    Outpatient Medications Prior to Visit  Medication Sig Dispense Refill  . amLODipine (NORVASC) 5 MG tablet Take 1 tablet (5 mg total) by mouth daily. 90 tablet 2  . carvedilol (COREG) 25 MG tablet Take 1 tablet (25 mg total) by mouth 2 (two) times daily with a meal. 180 tablet 2  . furosemide (LASIX) 40 MG tablet Take 0.5 tablets (20 mg total) by mouth daily. 90 tablet 2  . simvastatin (ZOCOR) 20 MG tablet Take 1 tablet (20 mg total) by mouth daily at 6 PM. 90 tablet 2  . valsartan (DIOVAN) 320 MG tablet Take 1 tablet (320 mg total) by mouth daily. 90 tablet 2  . warfarin (COUMADIN) 5 MG tablet Take 3 tablets on Tuesdays and Thursdays, and 2 tablets all other days 90 tablet 2   No facility-administered medications prior to visit.      Allergies:   Isosorbide nitrate and Lisinopril   Social History   Social History  . Marital status: Single    Spouse name:  N/A  . Number of children: N/A  . Years of education: N/A   Social History Main Topics  . Smoking status: Former Smoker    Packs/day: 0.50    Years: 10.00    Types: Cigarettes    Quit date: 04/19/2014  . Smokeless tobacco: Never Used  . Alcohol use 0.0 oz/week     Comment: 3 beers per week   . Drug use: No     Comment: Last used marijuana 02/18/2015  . Sexual activity: Not Asked   Other Topics Concern  . None   Social History Narrative  . None     Family History:  The patient's family history includes Diabetes in his mother; Hypertension in his mother.   ROS:   Please see the history of present illness.    ROS All other systems reviewed and are negative.   PHYSICAL EXAM:   VS:  BP 130/82 (BP Location: Right Arm, Patient Position: Sitting, Cuff Size: Large)   Pulse 75   Ht 5\' 11"   (1.803 m)   Wt (!) 304 lb 6.4 oz (138.1 kg)   SpO2 96%   BMI 42.46 kg/m    GEN: Morbidly obese, well developed, in no acute distress HEENT: normal Neck: no JVD, carotid bruits, or masses Cardiac: laterally displace apical impulse; RRR; no murmurs, rubs, or gallops,no edema  Respiratory:  clear to auscultation bilaterally, normal work of breathing GI: soft, nontender, nondistended, + BS MS: no deformity or atrophy Skin: warm and dry, no rash Neuro:  Alert and Oriented x 3, Strength and sensation are intact Psych: euthymic mood, full affect  Wt Readings from Last 3 Encounters:  01/10/16 (!) 304 lb 6.4 oz (138.1 kg)  06/27/15 288 lb 6.4 oz (130.8 kg)  04/23/15 275 lb 1.6 oz (124.8 kg)      Studies/Labs Reviewed:   EKG:  EKG is ordered today. It shows normal sinus rhythm with vertical axis, mild ST segment depression and T-wave inversion in leads 3 and aVF only, QTC 437 ms  Recent Labs: 02/21/2015: ALT 21; BUN 24; Creat 1.53; Potassium 4.8; Sodium 138   Lipid Panel    Component Value Date/Time   CHOL 174 11/18/2014 0133   TRIG 136 11/18/2014 0133   HDL 33 (L) 11/18/2014 0133   CHOLHDL 5.3 11/18/2014 0133   VLDL 27 11/18/2014 0133   LDLCALC 114 (H) 11/18/2014 0133     ASSESSMENT:    1. Chronic systolic congestive heart failure (HCC)   2. Malignant hypertension, heart failure, and stage III chronic kidney disease (HCC)   3. LV (left ventricular) mural thrombus (HCC)   4. Morbid obesity due to excess calories (HCC)      PLAN:  In order of problems listed above:  1. CHF:  EF improved to 40-45%, maybe even better now? Will recheck echo. Appears euvolemic, good functional status on very low dose loop diuretic. Continue efforts to aggressively Rx medically.  2. HTN:  Fairly effective BP reduction, target < 130/80  3. History of LV thrombus:  No thrombus seen on his last echo and substantial improvement in ejection fraction. Warfarin has been stopped. Recheck  echo. 4. Morbid besity:  I think he should follow a calorie/carbohydrate restricted diet. He should definitely stop eating honey buns.. Reinforce exercise (moderate aerobic, light weights okay, avoid sudden intense bursts of activity such as heavy weight lifting).   Medication Adjustments/Labs and Tests Ordered: Current medicines are reviewed at length with the patient today.  Concerns regarding medicines are  outlined above.  Medication changes, Labs and Tests ordered today are listed in the Patient Instructions below. Patient Instructions  Dr Royann Shivers recommends that you continue on your current medications as directed. Please refer to the Current Medication list given to you today.  Your physician has requested that you have an echocardiogram. Echocardiography is a painless test that uses sound waves to create images of your heart. It provides your doctor with information about the size and shape of your heart and how well your heart's chambers and valves are working. This procedure takes approximately one hour. There are no restrictions for this procedure.  Your physician recommends that you return for lab work at your earliest convenience.  Dr Royann Shivers recommends that you schedule a follow-up appointment in 6 months. You will receive a reminder letter in the mail two months in advance. If you don't receive a letter, please call our office to schedule the follow-up appointment.  If you need a refill on your cardiac medications before your next appointment, please call your pharmacy.   Your physician encouraged you to lose weight for better health.  Fat and Cholesterol Restricted Diet High levels of fat and cholesterol in your blood may lead to various health problems, such as diseases of the heart, blood vessels, gallbladder, liver, and pancreas. Fats are concentrated sources of energy that come in various forms. Certain types of fat, including saturated fat, may be harmful in excess.  Cholesterol is a substance needed by your body in small amounts. Your body makes all the cholesterol it needs. Excess cholesterol comes from the food you eat. When you have high levels of cholesterol and saturated fat in your blood, health problems can develop because the excess fat and cholesterol will gather along the walls of your blood vessels, causing them to narrow. Choosing the right foods will help you control your intake of fat and cholesterol. This will help keep the levels of these substances in your blood within normal limits and reduce your risk of disease. WHAT IS MY PLAN? Your health care provider recommends that you:  Get no more than __________ % of the total calories in your daily diet from fat.  Limit your intake of saturated fat to less than ______% of your total calories each day.  Limit the amount of cholesterol in your diet to less than _________mg per day. WHAT TYPES OF FAT SHOULD I CHOOSE?  Choose healthy fats more often. Choose monounsaturated and polyunsaturated fats, such as olive and canola oil, flaxseeds, walnuts, almonds, and seeds.  Eat more omega-3 fats. Good choices include salmon, mackerel, sardines, tuna, flaxseed oil, and ground flaxseeds. Aim to eat fish at least two times a week.  Limit saturated fats. Saturated fats are primarily found in animal products, such as meats, butter, and cream. Plant sources of saturated fats include palm oil, palm kernel oil, and coconut oil.  Avoid foods with partially hydrogenated oils in them. These contain trans fats. Examples of foods that contain trans fats are stick margarine, some tub margarines, cookies, crackers, and other baked goods. WHAT GENERAL GUIDELINES DO I NEED TO FOLLOW? These guidelines for healthy eating will help you control your intake of fat and cholesterol:  Check food labels carefully to identify foods with trans fats or high amounts of saturated fat.  Fill one half of your plate with vegetables and  green salads.  Fill one fourth of your plate with whole grains. Look for the word "whole" as the first word in the ingredient  list.  Fill one fourth of your plate with lean protein foods.  Limit fruit to two servings a day. Choose fruit instead of juice.  Eat more foods that contain soluble fiber. Examples of foods that contain this type of fiber are apples, broccoli, carrots, beans, peas, and barley. Aim to get 20-30 g of fiber per day.  Eat more home-cooked food and less restaurant, buffet, and fast food.  Limit or avoid alcohol.  Limit foods high in starch and sugar.  Limit fried foods.  Cook foods using methods other than frying. Baking, boiling, grilling, and broiling are all great options.  Lose weight if you are overweight. Losing just 5-10% of your initial body weight can help your overall health and prevent diseases such as diabetes and heart disease. WHAT FOODS CAN I EAT? Grains Whole grains, such as whole wheat or whole grain breads, crackers, cereals, and pasta. Unsweetened oatmeal, bulgur, barley, quinoa, or brown rice. Corn or whole wheat flour tortillas. Vegetables Fresh or frozen vegetables (raw, steamed, roasted, or grilled). Green salads. Fruits All fresh, canned (in natural juice), or frozen fruits. Meat and Other Protein Products Ground beef (85% or leaner), grass-fed beef, or beef trimmed of fat. Skinless chicken or [place]. Ground chicken or [place]. Pork trimmed of fat. All fish and seafood. Eggs. Dried beans, peas, or lentils. Unsalted nuts or seeds. Unsalted canned or dry beans. Dairy Low-fat dairy products, such as skim or 1% milk, 2% or reduced-fat cheeses, low-fat ricotta or cottage cheese, or plain low-fat yogurt. Fats and Oils Tub margarines without trans fats. Light or reduced-fat mayonnaise and salad dressings. Avocado. Olive, canola, sesame, or safflower oils. Natural peanut or almond butter (choose ones without added sugar and oil). The items listed  above may not be a complete list of recommended foods or beverages. Contact your dietitian for more options. WHAT FOODS ARE NOT RECOMMENDED? Grains White bread. White pasta. White rice. Cornbread. Bagels, pastries, and croissants. Crackers that contain trans fat. Vegetables White potatoes. Corn. Creamed or fried vegetables. Vegetables in a cheese sauce. Fruits Dried fruits. Canned fruit in light or heavy syrup. Fruit juice. Meat and Other Protein Products Fatty cuts of meat. Ribs, chicken wings, bacon, sausage, bologna, salami, chitterlings, fatback, hot dogs, bratwurst, and packaged luncheon meats. Liver and organ meats. Dairy Whole or 2% milk, cream, half-and-half, and cream cheese. Whole milk cheeses. Whole-fat or sweetened yogurt. Full-fat cheeses. Nondairy creamers and whipped toppings. Processed cheese, cheese spreads, or cheese curds. Sweets and Desserts Corn syrup, sugars, honey, and molasses. Candy. Jam and jelly. Syrup. Sweetened cereals. Cookies, pies, cakes, donuts, muffins, and ice cream. Fats and Oils Butter, stick margarine, lard, shortening, ghee, or bacon fat. Coconut, palm kernel, or palm oils. Beverages Alcohol. Sweetened drinks (such as sodas, lemonade, and fruit drinks or punches). The items listed above may not be a complete list of foods and beverages to avoid. Contact your dietitian for more information.   This information is not intended to replace advice given to you by your health care provider. Make sure you discuss any questions you have with your health care provider.   Document Released: 05/05/2005 Document Revised: 05/26/2014 Document Reviewed: 08/03/2013 Elsevier Interactive Patient Education [address]2016 [employer]       Signed, Thurmon FairMihai Harrington Jobe, MD  01/10/2016 2:29 PM    [facility] [address], [place], [place]  [address] Phone: [phone]; Fax: [phone]

## 2016-01-31 MED FILL — CARVEDILOL 25 MG TABLET: 25 | 30 days supply | Qty: 60 | Fill #2

## 2016-01-31 MED FILL — FUROSEMIDE 20 MG TABLET: 20 | 30 days supply | Qty: 30 | Fill #1

## 2016-01-31 MED FILL — SIMVASTATIN 20 MG TABLET: 20 | 30 days supply | Qty: 30 | Fill #3

## 2016-01-31 MED FILL — VALSARTAN 320 MG TABLET: 320 | 30 days supply | Qty: 30 | Fill #3

## 2016-01-31 MED FILL — AMLODIPINE BESYLATE 5 MG TA: 5 | 30 days supply | Qty: 30 | Fill #5

## 2016-03-11 ENCOUNTER — Other Ambulatory Visit (HOSPITAL_COMMUNITY): Payer: Medicaid Other

## 2016-03-31 MED FILL — FUROSEMIDE 20 MG TABLET: 20 | 30 days supply | Qty: 30 | Fill #2

## 2016-03-31 MED FILL — CARVEDILOL 25 MG TABLET: 25 | 30 days supply | Qty: 60 | Fill #3

## 2016-03-31 MED FILL — AMLODIPINE BESYLATE 5 MG TA: 5 | 30 days supply | Qty: 30 | Fill #6

## 2016-03-31 MED FILL — VALSARTAN 320 MG TABLET: 320 | 30 days supply | Qty: 30 | Fill #4

## 2016-03-31 MED FILL — SIMVASTATIN 20 MG TABLET: 20 | 30 days supply | Qty: 30 | Fill #4

## 2016-04-01 ENCOUNTER — Other Ambulatory Visit: Payer: Self-pay

## 2016-04-01 ENCOUNTER — Ambulatory Visit (HOSPITAL_COMMUNITY): Payer: Medicaid Other | Attending: Cardiovascular Disease

## 2016-04-01 DIAGNOSIS — I5022 Chronic systolic (congestive) heart failure: Secondary | ICD-10-CM

## 2016-04-07 ENCOUNTER — Telehealth: Payer: Self-pay | Admitting: Cardiovascular Disease

## 2016-04-07 NOTE — Telephone Encounter (Signed)
From ECHO report: LV function remains mildly depressed (40-45%) and it looks like it will not recover completely back to normal (55%). But this is still a really great improvement from the initial diagnosis and there is no sign of a clot in the heart chambers. Stay on the current meds please. Pt notified, pt states he will continue current meds and that he will try to adjust diet and loose 40# as directed by Dr C.

## 2016-04-07 NOTE — Telephone Encounter (Signed)
F/u message ° °Pt returning RN call. Please call back to discuss  °

## 2016-06-10 ENCOUNTER — Other Ambulatory Visit: Payer: Self-pay | Admitting: Cardiovascular Disease

## 2016-06-10 MED FILL — FUROSEMIDE 20 MG TABLET: 20 | 30 days supply | Qty: 30 | Fill #3

## 2016-06-10 MED FILL — VALSARTAN 320 MG TABLET: 320 | 30 days supply | Qty: 30 | Fill #5

## 2016-06-10 MED FILL — AMLODIPINE BESYLATE 5 MG TA: 5 | 30 days supply | Qty: 30 | Fill #0

## 2016-06-10 MED FILL — SIMVASTATIN 20 MG TABLET: 20 | 30 days supply | Qty: 30 | Fill #5

## 2016-06-10 MED FILL — CARVEDILOL 25 MG TABLET: 25 | 30 days supply | Qty: 60 | Fill #4

## 2016-06-10 NOTE — Telephone Encounter (Signed)
REFILL 

## 2016-07-17 IMAGING — CT CT HEAD W/O CM
4 of 6 series · 19 of 47 positions shown, 22 images · non-contrast
Comparison: None.

EXAM:
CT HEAD WITHOUT CONTRAST

CT CERVICAL SPINE WITHOUT CONTRAST
TECHNIQUE: Multidetector CT imaging of the head and cervical spine was
performed following the standard protocol without intravenous
contrast. Multiplanar CT image reconstructions of the cervical spine
were also generated.

[Series 202: head w/o bone, idose (1) · axial · non-contrast · 0.49mm/px · z∈[+26,+121]mm · 4 of 64 slices shown]
[im 13/64  bone]
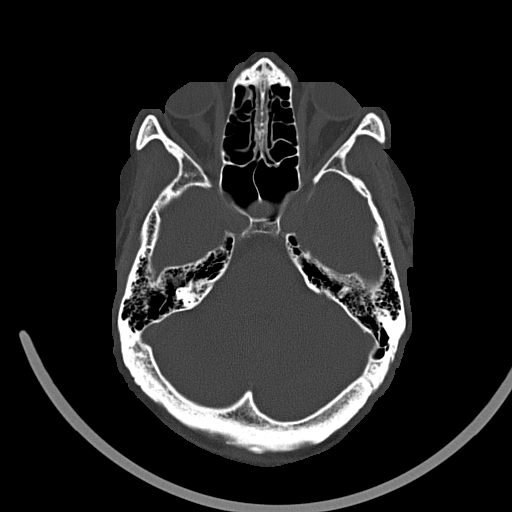
[im 26/64  bone]
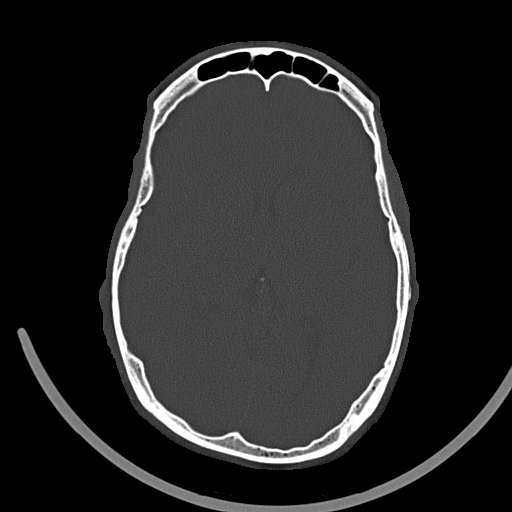
[im 38/64  bone]
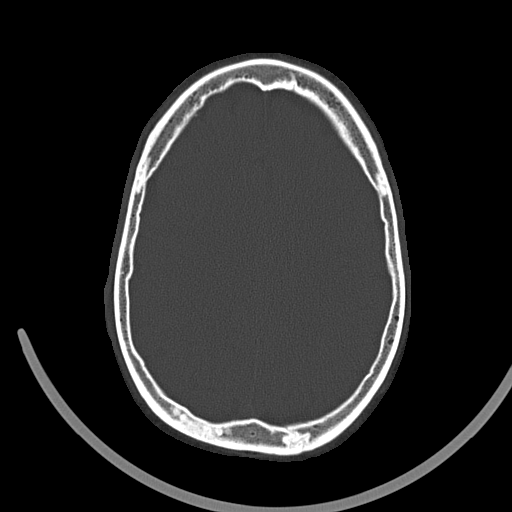
[im 51/64  bone]
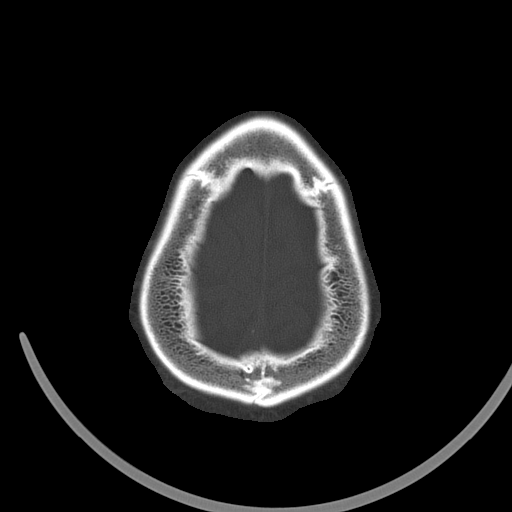

[Series 304: coronal, idose (2) · coronal · 0.35mm/px · 3 of 87 slices shown]
[im 29/87  brain]
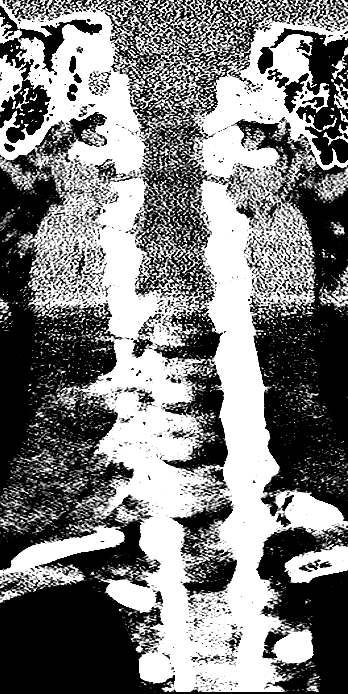
[im 39/87  brain]
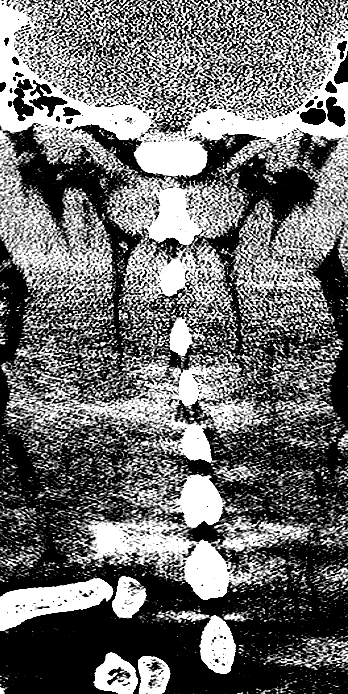
[im 48/87  brain]
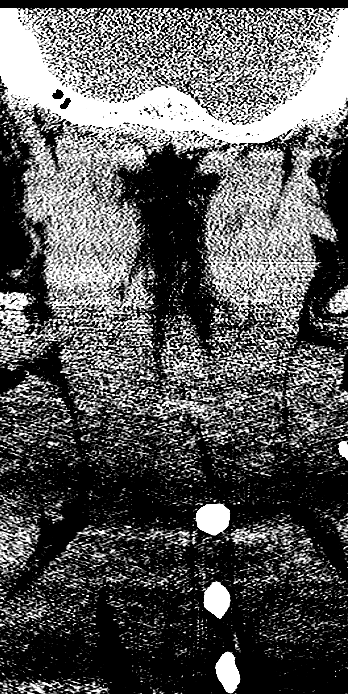

[Series 305: sagittal, idose (2) · sagittal · 0.35mm/px · 3 of 61 slices shown]
[im 21/61  brain]
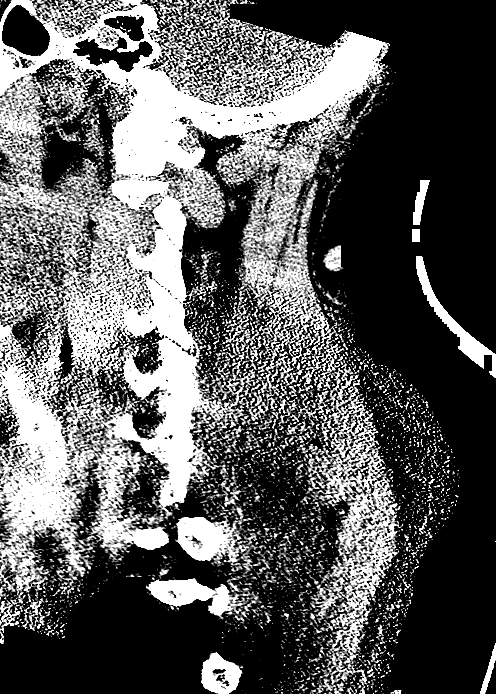
[im 31/61  brain]
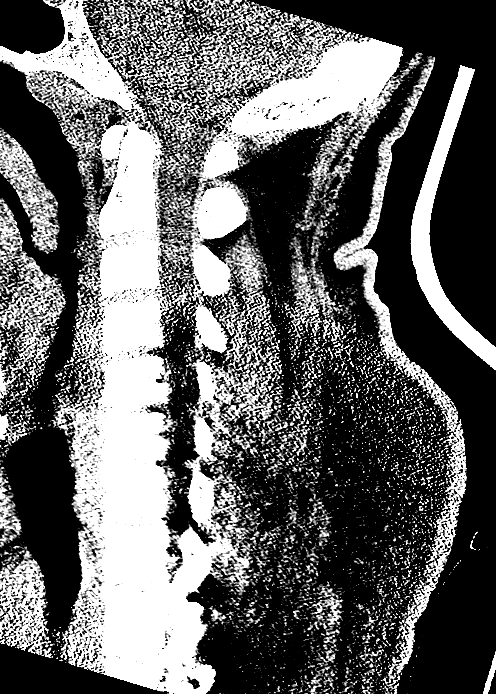
[im 41/61  brain]
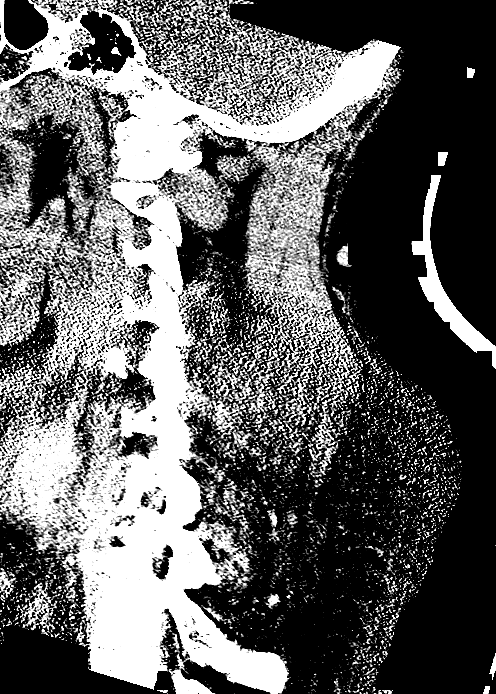

[Series 306: orthogonals, idose (2) · axial · 0.46mm/px · z∈[-187,-3]mm · 9 of 121 slices shown, 12 images]
[im 13/121  brain]
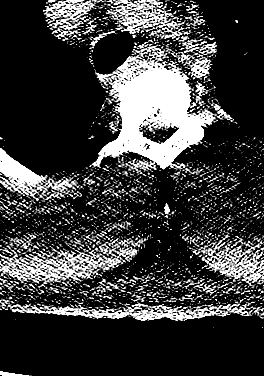
[im 13/121  bone]
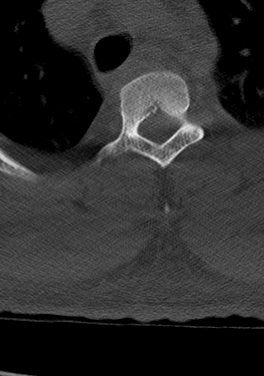
[im 25/121  brain]
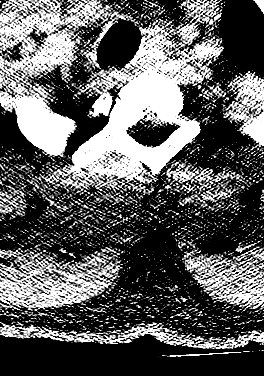
[im 37/121  brain]
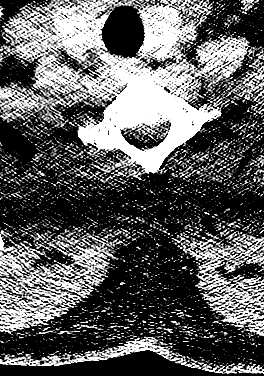
[im 49/121  brain]
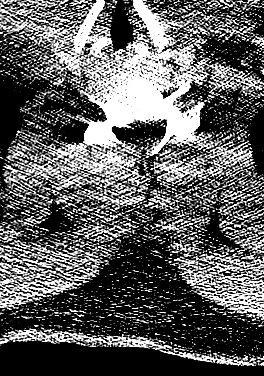
[im 61/121  brain]
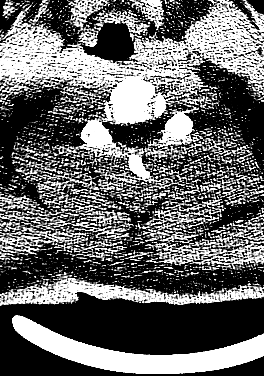
[im 61/121  bone]
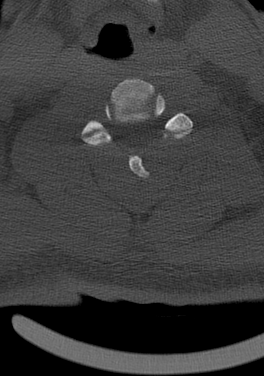
[im 73/121  brain]
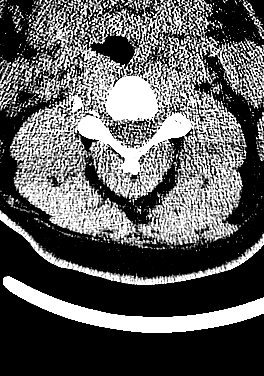
[im 85/121  brain]
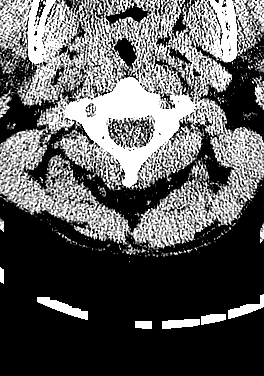
[im 97/121  brain]
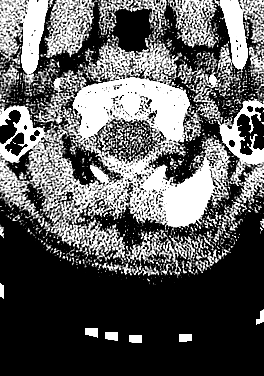
[im 109/121  brain]
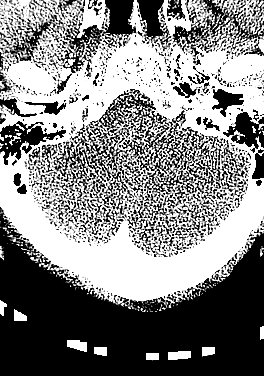
[im 109/121  bone]
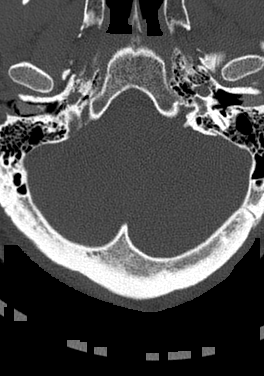

[19 of 47 positions shown; findings below may reference images not displayed]

FINDINGS: CT HEAD FINDINGS

There is no acute intracranial hemorrhage or infarct. No mass lesion
or midline shift. Gray-white matter differentiation is well
maintained. Ventricles are normal in size without evidence of
hydrocephalus. CSF containing spaces are within normal limits. No
extra-axial fluid collection.

The calvarium is intact.

Orbital soft tissues are within normal limits.

The paranasal sinuses and mastoid air cells are well pneumatized and
free of fluid.

Scalp soft tissues a demonstrate no acute abnormality. Small lipoma
noted within the left occipital scalp.

CT CERVICAL SPINE FINDINGS

Study mildly degraded by motion artifact.

The vertebral bodies are normally aligned with preservation of the
normal cervical lordosis. Vertebral body heights are preserved.
Normal C1-2 articulations are intact. No prevertebral soft tissue
swelling. No acute fracture or listhesis.

Visualized soft tissues of the neck are within normal limits.
Visualized lung apices are clear without evidence of apical
pneumothorax.
IMPRESSION: CT BRAIN:

No acute intracranial process.

CT CERVICAL SPINE:

No acute traumatic injury within the cervical spine.

## 2016-09-05 ENCOUNTER — Other Ambulatory Visit: Payer: Self-pay | Admitting: Cardiovascular Disease

## 2016-09-05 MED FILL — VALSARTAN 320 MG TABLET: 320 | 30 days supply | Qty: 30 | Fill #0

## 2016-09-05 MED FILL — AMLODIPINE BESYLATE 5 MG TA: 5 | 30 days supply | Qty: 30 | Fill #1

## 2016-09-08 ENCOUNTER — Encounter: Payer: Self-pay | Admitting: Family Medicine

## 2016-09-08 ENCOUNTER — Ambulatory Visit: Payer: Medicaid Other | Attending: Family Medicine | Admitting: Family Medicine

## 2016-09-08 VITALS — BP 180/118 | HR 100 | Temp 98.1°F | Ht 71.0 in | Wt 313.4 lb

## 2016-09-08 DIAGNOSIS — F17200 Nicotine dependence, unspecified, uncomplicated: Secondary | ICD-10-CM | POA: Diagnosis not present

## 2016-09-08 DIAGNOSIS — Z9119 Patient's noncompliance with other medical treatment and regimen: Secondary | ICD-10-CM | POA: Diagnosis not present

## 2016-09-08 DIAGNOSIS — Z79899 Other long term (current) drug therapy: Secondary | ICD-10-CM | POA: Insufficient documentation

## 2016-09-08 DIAGNOSIS — I5042 Chronic combined systolic (congestive) and diastolic (congestive) heart failure: Secondary | ICD-10-CM | POA: Insufficient documentation

## 2016-09-08 DIAGNOSIS — I110 Hypertensive heart disease with heart failure: Secondary | ICD-10-CM | POA: Insufficient documentation

## 2016-09-08 DIAGNOSIS — I10 Essential (primary) hypertension: Secondary | ICD-10-CM

## 2016-09-08 DIAGNOSIS — F17210 Nicotine dependence, cigarettes, uncomplicated: Secondary | ICD-10-CM | POA: Insufficient documentation

## 2016-09-08 MED ORDER — FUROSEMIDE 40 MG PO TABS: 20.0000 mg | ORAL_TABLET | Freq: Every day | ORAL | 5 refills | Status: DC

## 2016-09-08 MED ORDER — CARVEDILOL 12.5 MG PO TABS: 12.5000 mg | ORAL_TABLET | Freq: Two times a day (BID) | ORAL | 5 refills | Status: DC

## 2016-09-08 MED ORDER — AMLODIPINE BESYLATE 10 MG PO TABS: 10.0000 mg | ORAL_TABLET | Freq: Every day | ORAL | 5 refills | Status: DC

## 2016-09-08 MED ORDER — VALSARTAN 320 MG PO TABS: 320.0000 mg | ORAL_TABLET | Freq: Every day | ORAL | 5 refills | Status: DC

## 2016-09-08 MED ORDER — CLONIDINE HCL 0.1 MG PO TABS
0.2000 mg | ORAL_TABLET | Freq: Once | ORAL | Status: AC
  Administered 2016-09-08: 0.2 mg via ORAL

## 2016-09-08 NOTE — Patient Instructions (Addendum)
Jayseon was seen today for hypertension.  Diagnoses and all orders for this visit:  Chronic combined systolic and diastolic CHF (congestive heart failure) (HCC) -     carvedilol (COREG) 12.5 MG tablet; Take 1 tablet (12.5 mg total) by mouth 2 (two) times daily with a meal. -     amLODipine (NORVASC) 10 MG tablet; Take 1 tablet (10 mg total) by mouth daily. -     valsartan (DIOVAN) 320 MG tablet; Take 1 tablet (320 mg total) by mouth daily. -     furosemide (LASIX) 40 MG tablet; Take 0.5 tablets (20 mg total) by mouth daily.  reduce salt in diet Reduce weight with exercise and low carb diet Take all medications as prescribed  Smoking cessation support: smoking cessation hotline: 1-800-QUIT-NOW.  Smoking cessation classes are available through [facility]. Call [phone] or visit our website at [email or web address].   f/u in 3 weeks for HTN  Dr. Armen Pickup

## 2016-09-08 NOTE — Progress Notes (Signed)
Patient ID: Shawn [name], male   DOB: 1986/03/27, 31 y.o.   MRN: [id number]   Subjective:  Patient ID: Shawn [name], male    DOB: 09-02-1985  Age: 31 y.o. MRN: [id number]  CC: Hypertension   HPI Shawn [name] has chronic HTN, systolic CHF, LV mural thrombus s/p coumadin treatment he presents for   1. CHRONIC HYPERTENSION in setting of systolic CHF (EF 49-75% on ECHO 04/01/2016)  Disease Monitoring  Blood pressure range: not checking   Chest pain: no   Dyspnea: no   Claudication: no   Medication compliance: no diovan for one week, no coreg for 3 days due to headache, takes lasix 20 mg daily 2-3 times per week  Medication Side Effects  Lightheadedness: no   Urinary frequency: no   Edema: no    He has recently started smoking again. He has not been compliant with low salt diet.   Social History  Substance Use Topics  . Smoking status: Former Smoker    Packs/day: 0.50    Years: 10.00    Types: Cigarettes    Quit date: 04/19/2014  . Smokeless tobacco: Never Used  . Alcohol use 0.0 oz/week     Comment: 3 beers per week    Outpatient Medications Prior to Visit  Medication Sig Dispense Refill  . amLODipine (NORVASC) 5 MG tablet TAKE 1 TABLET BY MOUTH DAILY. 90 tablet 1  . carvedilol (COREG) 25 MG tablet Take 1 tablet (25 mg total) by mouth 2 (two) times daily with a meal. 180 tablet 2  . furosemide (LASIX) 40 MG tablet Take 0.5 tablets (20 mg total) by mouth daily. 90 tablet 2  . simvastatin (ZOCOR) 20 MG tablet Take 1 tablet (20 mg total) by mouth daily at 6 PM. 90 tablet 2  . valsartan (DIOVAN) 320 MG tablet TAKE 1 TABLET BY MOUTH DAILY. 30 tablet 0   No facility-administered medications prior to visit.     ROS Review of Systems  Constitutional: Negative for chills, fatigue, fever and unexpected weight change.  Eyes: Negative for visual disturbance.  Respiratory: Negative for cough and shortness of breath.   Cardiovascular: Negative for chest pain,  palpitations and leg swelling.  Gastrointestinal: Negative for abdominal pain, blood in stool, constipation, diarrhea, nausea and vomiting.  Endocrine: Negative for polydipsia, polyphagia and polyuria.  Musculoskeletal: Negative for arthralgias, back pain, gait problem, myalgias and neck pain.  Skin: Negative for rash.  Allergic/Immunologic: Negative for immunocompromised state.  Neurological: Negative for dizziness and light-headedness.  Hematological: Negative for adenopathy. Does not bruise/bleed easily.  Psychiatric/Behavioral: Negative for dysphoric mood, sleep disturbance and suicidal ideas. The patient is not nervous/anxious.     Objective:  BP (!) 180/118   Pulse 100   Temp 98.1 F (36.7 C) (Oral)   Ht '5\' 11"'  (1.803 m)   Wt (!) 313 lb 6.4 oz (142.2 kg)   SpO2 98%   BMI 43.71 kg/m   BP/Weight 09/08/2016 3/00/5110 06/20/1171  Systolic BP 567 014 103  Diastolic BP 013 82 98  Wt. (Lbs) 313.4 304.4 288.4  BMI 43.71 42.46 40.24   Physical Exam  Constitutional: He appears well-developed and well-nourished. No distress.  HENT:  Head: Normocephalic and atraumatic.  Neck: Normal range of motion. Neck supple. No JVD present.  Cardiovascular: Normal rate, regular rhythm, normal heart sounds and intact distal pulses.   Pulmonary/Chest: Effort normal and breath sounds normal.  Musculoskeletal: He exhibits no edema.  Neurological: He is alert.  Skin: Skin is  warm and dry. No rash noted. No erythema.  Psychiatric: He has a normal mood and affect.     Assessment & Plan:   Problem List Items Addressed This Visit      High   Hypertension - Primary (Chronic)    A: chronic HTN with systolic CHF with mildly reduced ejection fraction. Medication and salt restriction non-compliance and smoking. Treated HTN with clonidine 0.2 mg PO x one  P: Coreg decreased to 12.5 mg BID Increase Norvasc to 10 mg daily Continue Diovan 320 mg daily  Continue lasix 20 mg daily         Relevant  Medications   carvedilol (COREG) 12.5 MG tablet   amLODipine (NORVASC) 10 MG tablet   valsartan (DIOVAN) 320 MG tablet   furosemide (LASIX) 40 MG tablet   cloNIDine (CATAPRES) tablet 0.2 mg (Completed)   Other Relevant Orders   BMP8+EGFR (Completed)   Chronic combined systolic and diastolic CHF (congestive heart failure) (HCC) (Chronic)   Relevant Medications   carvedilol (COREG) 12.5 MG tablet   amLODipine (NORVASC) 10 MG tablet   valsartan (DIOVAN) 320 MG tablet   furosemide (LASIX) 40 MG tablet   cloNIDine (CATAPRES) tablet 0.2 mg (Completed)   Other Relevant Orders   BMP8+EGFR (Completed)      No orders of the defined types were placed in this encounter.   Follow-up: Return in about 3 weeks (around 09/29/2016) for HTN.   Boykin Nearing MD

## 2016-09-09 DIAGNOSIS — F17200 Nicotine dependence, unspecified, uncomplicated: Secondary | ICD-10-CM | POA: Insufficient documentation

## 2016-09-09 LAB — BMP8+EGFR
BUN/Creatinine Ratio: 16 (ref 9–20)
BUN: 24 mg/dL — ABNORMAL HIGH (ref 6–20)
CALCIUM: 9.5 mg/dL (ref 8.7–10.2)
CHLORIDE: 102 mmol/L (ref 96–106)
CO2: 21 mmol/L (ref 18–29)
Creatinine, Ser: 1.52 mg/dL — ABNORMAL HIGH (ref 0.76–1.27)
GFR calc non Af Amer: 60 mL/min/{1.73_m2} (ref >59)
GFR, EST AFRICAN AMERICAN: 70 mL/min/{1.73_m2} (ref >59)
GLUCOSE: 85 mg/dL (ref 65–99)
POTASSIUM: 4 mmol/L (ref 3.5–5.2)
Sodium: 140 mmol/L (ref 134–144)

## 2016-09-09 NOTE — Assessment & Plan Note (Signed)
Cessation addressed  

## 2016-09-09 NOTE — Assessment & Plan Note (Signed)
A: chronic HTN with systolic CHF with mildly reduced ejection fraction. Medication and salt restriction non-compliance and smoking. Treated HTN with clonidine 0.2 mg PO x one  P: Coreg decreased to 12.5 mg BID Increase Norvasc to 10 mg daily Continue Diovan 320 mg daily  Continue lasix 20 mg daily

## 2016-09-10 ENCOUNTER — Telehealth: Payer: Self-pay

## 2016-09-10 NOTE — Telephone Encounter (Signed)
Pt was called and a message was left informing pt to return phone call for lab results. 

## 2016-09-11 ENCOUNTER — Telehealth: Payer: Self-pay | Admitting: Family Medicine

## 2016-09-11 NOTE — Telephone Encounter (Signed)
Pt. Returned  Nurse call regarding results. Please f/u

## 2016-09-11 NOTE — Telephone Encounter (Signed)
Pt was called and informed of lab results. 

## 2016-09-11 NOTE — Telephone Encounter (Signed)
Patient returning call.

## 2016-10-01 ENCOUNTER — Encounter: Payer: Self-pay | Admitting: Family Medicine

## 2016-10-02 MED FILL — CARVEDILOL 12.5 MG TABLET: 12.5 | 15 days supply | Qty: 30 | Fill #0

## 2016-10-02 MED FILL — FUROSEMIDE 40 MG TABLET: 40 | 30 days supply | Qty: 15 | Fill #0

## 2016-10-02 MED FILL — AMLODIPINE BESYLATE 10 MG T: 10 | 30 days supply | Qty: 30 | Fill #0

## 2016-10-02 MED FILL — VALSARTAN 320 MG TABLET: 320 | 30 days supply | Qty: 30 | Fill #0

## 2016-10-10 ENCOUNTER — Ambulatory Visit: Payer: Medicaid Other | Attending: Family Medicine | Admitting: Family Medicine

## 2016-10-10 ENCOUNTER — Encounter: Payer: Self-pay | Admitting: Family Medicine

## 2016-10-10 VITALS — BP 126/77 | HR 86 | Temp 97.9°F | Wt 314.6 lb

## 2016-10-10 DIAGNOSIS — N182 Chronic kidney disease, stage 2 (mild): Secondary | ICD-10-CM | POA: Diagnosis present

## 2016-10-10 DIAGNOSIS — I10 Essential (primary) hypertension: Secondary | ICD-10-CM

## 2016-10-10 DIAGNOSIS — F17210 Nicotine dependence, cigarettes, uncomplicated: Secondary | ICD-10-CM | POA: Diagnosis not present

## 2016-10-10 DIAGNOSIS — I129 Hypertensive chronic kidney disease with stage 1 through stage 4 chronic kidney disease, or unspecified chronic kidney disease: Secondary | ICD-10-CM | POA: Diagnosis present

## 2016-10-10 LAB — POCT GLYCOSYLATED HEMOGLOBIN (HGB A1C): HEMOGLOBIN A1C: 5.6

## 2016-10-10 NOTE — Assessment & Plan Note (Signed)
Renal function has improved with BP reduction Will recheck today

## 2016-10-10 NOTE — Assessment & Plan Note (Signed)
A: BP at goal on current regimen Stressed low salt diet Smoking cessation He has an older brother on dialysis. We discussed that uncontrolled HTN is one of the common causes of dialysis  P: Continue current regimen Repeat BMP today

## 2016-10-10 NOTE — Progress Notes (Addendum)
Patient ID: Shawn [name], male   DOB: February 26, 1986, 31 y.o.   MRN: [id number]   Subjective:  Patient ID: Shawn [name], male    DOB: August 21, 1985  Age: 31 y.o. MRN: [id number]  CC: Hypertension   HPI Shawn [name] has chronic HTN, systolic CHF, LV mural thrombus s/p coumadin treatment he presents for   1. CHRONIC HYPERTENSION in setting of systolic CHF (EF 25-00% on ECHO 04/01/2016)  Disease Monitoring  Blood pressure range: not checking   Chest pain: no   Dyspnea: no   Claudication: no   Medication compliance: yes.   Medication Side Effects  Lightheadedness: no   Urinary frequency: no   Edema: no    He eats out and cooks at home. When he eats out he eats subway.  He ate out at [employer] and had leg swelling for 4 days.  Smoking 2-3 cigs per day.   Social History  Substance Use Topics  . Smoking status: Former Smoker    Packs/day: 0.50    Years: 10.00    Types: Cigarettes    Quit date: 04/19/2014  . Smokeless tobacco: Never Used  . Alcohol use 0.0 oz/week     Comment: 3 beers per week    Family History  Problem Relation Age of Onset  . Diabetes Mother   . Hypertension Mother   . Kidney disease Brother 98       on dialysis. never went to the doctor     Outpatient Medications Prior to Visit  Medication Sig Dispense Refill  . amLODipine (NORVASC) 10 MG tablet Take 1 tablet (10 mg total) by mouth daily. 30 tablet 5  . carvedilol (COREG) 12.5 MG tablet Take 1 tablet (12.5 mg total) by mouth 2 (two) times daily with a meal. 30 tablet 5  . furosemide (LASIX) 40 MG tablet Take 0.5 tablets (20 mg total) by mouth daily. 30 tablet 5  . simvastatin (ZOCOR) 20 MG tablet Take 1 tablet (20 mg total) by mouth daily at 6 PM. 90 tablet 2  . valsartan (DIOVAN) 320 MG tablet Take 1 tablet (320 mg total) by mouth daily. 30 tablet 5   No facility-administered medications prior to visit.     ROS Review of Systems  Constitutional: Negative for chills, fatigue,  fever and unexpected weight change.  Eyes: Negative for visual disturbance.  Respiratory: Negative for cough and shortness of breath.   Cardiovascular: Negative for chest pain, palpitations and leg swelling.  Gastrointestinal: Negative for abdominal pain, blood in stool, constipation, diarrhea, nausea and vomiting.  Endocrine: Negative for polydipsia, polyphagia and polyuria.  Musculoskeletal: Negative for arthralgias, back pain, gait problem, myalgias and neck pain.  Skin: Negative for rash.  Allergic/Immunologic: Negative for immunocompromised state.  Neurological: Negative for dizziness and light-headedness.  Hematological: Negative for adenopathy. Does not bruise/bleed easily.  Psychiatric/Behavioral: Negative for dysphoric mood, sleep disturbance and suicidal ideas. The patient is not nervous/anxious.     Objective:  BP 126/77   Pulse 86   Temp 97.9 F (36.6 C) (Oral)   Wt (!) 314 lb 9.6 oz (142.7 kg)   SpO2 97%   BMI 43.88 kg/m   BP/Weight 10/10/2016 09/08/2016 3/70/4888  Systolic BP 916 945 038  Diastolic BP 77 882 82  Wt. (Lbs) 314.6 313.4 304.4  BMI 43.88 43.71 42.46   Physical Exam  Constitutional: He appears well-developed and well-nourished. No distress.  HENT:  Head: Normocephalic and atraumatic.  Neck: Normal range of motion. Neck supple. No JVD  present.  Cardiovascular: Normal rate, regular rhythm, normal heart sounds and intact distal pulses.   Pulmonary/Chest: Effort normal and breath sounds normal.  Musculoskeletal: He exhibits no edema.  Neurological: He is alert.  Skin: Skin is warm and dry. No rash noted. No erythema.  Psychiatric: He has a normal mood and affect.   Lab Results  Component Value Date   HGBA1C 5.5 11/17/2014   Lab Results  Component Value Date   HGBA1C 5.6 10/10/2016      Assessment & Plan:   Problem List Items Addressed This Visit      High   Hypertension - Primary (Chronic)    A: BP at goal on current regimen Stressed low  salt diet Smoking cessation He has an older brother on dialysis. 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   Follow-up: Return in about 6 weeks (around 11/21/2016) for HTN.   Boykin Nearing MD

## 2016-10-10 NOTE — Patient Instructions (Addendum)
Shawn [name] was seen today for hypertension.  Diagnoses and all orders for this visit:  Essential hypertension -     POCT glycosylated hemoglobin (Hb A1C) -     BMP8+eGFR   Stay the course Keep the salt low in your diet   F/u in 6  weeks for HTN   Dr. Adrian Blackwater    DASH Eating Plan DASH stands for "Dietary Approaches to Stop Hypertension." The DASH eating plan is a healthy eating plan that has been shown to reduce high blood pressure (hypertension). It may also reduce your risk for type 2 diabetes, heart disease, and stroke. The DASH eating plan may also help with weight loss. What are tips for following this plan? General guidelines   Avoid eating more than 2,300 mg (milligrams) of salt (sodium) a day. If you have hypertension, you may need to reduce your sodium intake to 1,500 mg a day.  Limit alcohol intake to no more than 1 drink a day for nonpregnant women and 2 drinks a day for men. One drink equals 12 oz of beer, 5 oz of wine, or 1 oz of hard liquor.  Work with your health care provider to maintain a healthy body weight or to lose weight. Ask what an ideal weight is for you.  Get at least 30 minutes of exercise that causes your heart to beat faster (aerobic exercise) most days of the week. Activities may include walking, swimming, or biking.  Work with your health care provider or diet and nutrition specialist (dietitian) to adjust your eating plan to your individual calorie needs. Reading food labels   Check food labels for the amount of sodium per serving. Choose foods with less than 5 percent of the Daily Value of sodium. Generally, foods with less than 300 mg of sodium per serving fit into this eating plan.  To find whole grains, look for the word "whole" as the first word in the ingredient list. Shopping   Buy products labeled as "low-sodium" or "no salt added."  Buy fresh foods. Avoid canned foods and premade or frozen meals. Cooking   Avoid adding salt when cooking.  Use salt-free seasonings or herbs instead of table salt or sea salt. Check with your health care provider or pharmacist before using salt substitutes.  Do not fry foods. Cook foods using healthy methods such as baking, boiling, grilling, and broiling instead.  Cook with heart-healthy oils, such as olive, canola, soybean, or sunflower oil. Meal planning    Eat a balanced diet that includes:  5 or more servings of fruits and vegetables each day. At each meal, try to fill half of your plate with fruits and vegetables.  Up to 6-8 servings of whole grains each day.  Less than 6 oz of lean meat, poultry, or fish each day. A 3-oz serving of meat is about the same size as a deck of cards. One egg equals 1 oz.  2 servings of low-fat dairy each day.  A serving of nuts, seeds, or beans 5 times each week.  Heart-healthy fats. Healthy fats called Omega-3 fatty acids are found in foods such as flaxseeds and coldwater fish, like sardines, salmon, and mackerel.  Limit how much you eat of the following:  Canned or prepackaged foods.  Food that is high in trans fat, such as fried foods.  Food that is high in saturated fat, such as fatty meat.  Sweets, desserts, sugary drinks, and other foods with added sugar.  Full-fat dairy products.  Do not  salt foods before eating.  Try to eat at least 2 vegetarian meals each week.  Eat more home-cooked food and less restaurant, buffet, and fast food.  When eating at a restaurant, ask that your food be prepared with less salt or no salt, if possible. What foods are recommended? The items listed may not be a complete list. Talk with your dietitian about what dietary choices are best for you. Grains  Whole-grain or whole-wheat bread. Whole-grain or whole-wheat pasta. Brown rice. [place]. Bulgur. Whole-grain and low-sodium cereals. Pita bread. Low-fat, low-sodium crackers. Whole-wheat flour tortillas. Vegetables  Fresh or frozen vegetables (raw,  steamed, roasted, or grilled). Low-sodium or reduced-sodium tomato and vegetable juice. Low-sodium or reduced-sodium tomato sauce and tomato paste. Low-sodium or reduced-sodium canned vegetables. Fruits  All fresh, dried, or frozen fruit. Canned fruit in natural juice (without added sugar). Meat and other protein foods  Skinless chicken or [place]. Ground chicken or [place]. Pork with fat trimmed off. Fish and seafood. Egg whites. Dried beans, peas, or lentils. Unsalted nuts, nut butters, and seeds. Unsalted canned beans. Lean cuts of beef with fat trimmed off. Low-sodium, lean deli meat. Dairy  Low-fat (1%) or fat-free (skim) milk. Fat-free, low-fat, or reduced-fat cheeses. Nonfat, low-sodium ricotta or cottage cheese. Low-fat or nonfat yogurt. Low-fat, low-sodium cheese. Fats and oils  Soft margarine without trans fats. Vegetable oil. Low-fat, reduced-fat, or light mayonnaise and salad dressings (reduced-sodium). Canola, safflower, olive, soybean, and sunflower oils. Avocado. Seasoning and other foods  Herbs. Spices. Seasoning mixes without salt. Unsalted popcorn and pretzels. Fat-free sweets. What foods are not recommended? The items listed may not be a complete list. Talk with your dietitian about what dietary choices are best for you. Grains  Baked goods made with fat, such as croissants, muffins, or some breads. Dry pasta or rice meal packs. Vegetables  Creamed or fried vegetables. Vegetables in a cheese sauce. Regular canned vegetables (not low-sodium or reduced-sodium). Regular canned tomato sauce and paste (not low-sodium or reduced-sodium). Regular tomato and vegetable juice (not low-sodium or reduced-sodium). Angie Fava. Olives. Fruits  Canned fruit in a light or heavy syrup. Fried fruit. Fruit in cream or butter sauce. Meat and other protein foods  Fatty cuts of meat. Ribs. Fried meat. Berniece Salines. Sausage. Bologna and other processed lunch meats. Salami. Fatback. Hotdogs. Bratwurst. Salted nuts  and seeds. Canned beans with added salt. Canned or smoked fish. Whole eggs or egg yolks. Chicken or [place] with skin. Dairy  Whole or 2% milk, cream, and half-and-half. Whole or full-fat cream cheese. Whole-fat or sweetened yogurt. Full-fat cheese. Nondairy creamers. Whipped toppings. Processed cheese and cheese spreads. Fats and oils  Butter. Stick margarine. Lard. Shortening. Ghee. Bacon fat. Tropical oils, such as coconut, palm kernel, or palm oil. Seasoning and other foods  Salted popcorn and pretzels. Onion salt, garlic salt, seasoned salt, table salt, and sea salt. Worcestershire sauce. Tartar sauce. Barbecue sauce. Teriyaki sauce. Soy sauce, including reduced-sodium. Steak sauce. Canned and packaged gravies. Fish sauce. Oyster sauce. Cocktail sauce. Horseradish that you find on the shelf. Ketchup. Mustard. Meat flavorings and tenderizers. Bouillon cubes. Hot sauce and Tabasco sauce. Premade or packaged marinades. Premade or packaged taco seasonings. Relishes. Regular salad dressings. Where to find more information:  National Heart, Lung, and [place]: https://wilson-eaton.com/  American Heart Association: www.heart.org Summary  The DASH eating plan is a healthy eating plan that has been shown to reduce high blood pressure (hypertension). It may also reduce your risk for type 2 diabetes, heart disease, and  stroke.  With the DASH eating plan, you should limit salt (sodium) intake to 2,300 mg a day. If you have hypertension, you may need to reduce your sodium intake to 1,500 mg a day.  When on the DASH eating plan, aim to eat more fresh fruits and vegetables, whole grains, lean proteins, low-fat dairy, and heart-healthy fats.  Work with your health care provider or diet and nutrition specialist (dietitian) to adjust your eating plan to your individual calorie needs. This information is not intended to replace advice given to you by your health care provider. Make sure you discuss any  questions you have with your health care provider. Document Released: 04/24/2011 Document Revised: 04/28/2016 Document Reviewed: 04/28/2016 Elsevier Interactive Patient Education  2017 [place].    Low-Sodium Eating Plan Sodium, which is an element that makes up salt, helps you maintain a healthy balance of fluids in your body. Too much sodium can increase your blood pressure and cause fluid and waste to be held in your body. Your health care provider or dietitian may recommend following this plan if you have high blood pressure (hypertension), kidney disease, liver disease, or heart failure. Eating less sodium can help lower your blood pressure, reduce swelling, and protect your heart, liver, and kidneys. What are tips for following this plan? General guidelines   Most people on this plan should limit their sodium intake to 1,500-2,000 mg (milligrams) of sodium each day. Reading food labels   The Nutrition Facts label lists the amount of sodium in one serving of the food. If you eat more than one serving, you must multiply the listed amount of sodium by the number of servings.  Choose foods with less than 140 mg of sodium per serving.  Avoid foods with 300 mg of sodium or more per serving. Shopping   Look for lower-sodium products, often labeled as "low-sodium" or "no salt added."  Always check the sodium content even if foods are labeled as "unsalted" or "no salt added".  Buy fresh foods.  Avoid canned foods and premade or frozen meals.  Avoid canned, cured, or processed meats  Buy breads that have less than 80 mg of sodium per slice. Cooking   Eat more home-cooked food and less restaurant, buffet, and fast food.  Avoid adding salt when cooking. Use salt-free seasonings or herbs instead of table salt or sea salt. Check with your health care provider or pharmacist before using salt substitutes.  Cook with plant-based oils, such as canola, sunflower, or olive oil. Meal  planning   When eating at a restaurant, ask that your food be prepared with less salt or no salt, if possible.  Avoid foods that contain MSG (monosodium glutamate). MSG is sometimes added to [place] food, bouillon, and some canned foods. What foods are recommended? The items listed may not be a complete list. Talk with your dietitian about what dietary choices are best for you. Grains  Low-sodium cereals, including oats, puffed wheat and rice, and shredded wheat. Low-sodium crackers. Unsalted rice. Unsalted pasta. Low-sodium bread. Whole-grain breads and whole-grain pasta. Vegetables  Fresh or frozen vegetables. "No salt added" canned vegetables. "No salt added" tomato sauce and paste. Low-sodium or reduced-sodium tomato and vegetable juice. Fruits  Fresh, frozen, or canned fruit. Fruit juice. Meats and other protein foods  Fresh or frozen (no salt added) meat, poultry, seafood, and fish. Low-sodium canned tuna and salmon. Unsalted nuts. Dried peas, beans, and lentils without added salt. Unsalted canned beans. Eggs. Unsalted nut butters. Dairy  Milk. Soy milk. Cheese that is naturally low in sodium, such as ricotta cheese, fresh mozzarella, or Swiss cheese Low-sodium or reduced-sodium cheese. Cream cheese. Yogurt. Fats and oils  Unsalted butter. Unsalted margarine with no trans fat. Vegetable oils such as canola or olive oils. Seasonings and other foods  Fresh and dried herbs and spices. Salt-free seasonings. Low-sodium mustard and ketchup. Sodium-free salad dressing. Sodium-free light mayonnaise. Fresh or refrigerated horseradish. Lemon juice. Vinegar. Homemade, reduced-sodium, or low-sodium soups. Unsalted popcorn and pretzels. Low-salt or salt-free chips. What foods are not recommended? The items listed may not be a complete list. Talk with your dietitian about what dietary choices are best for you. Grains  Instant hot cereals. Bread stuffing, pancake, and biscuit mixes. Croutons. Seasoned  rice or pasta mixes. Noodle soup cups. Boxed or frozen macaroni and cheese. Regular salted crackers. Self-rising flour. Vegetables  Sauerkraut, pickled vegetables, and relishes. Olives. [place] fries. Onion rings. Regular canned vegetables (not low-sodium or reduced-sodium). Regular canned tomato sauce and paste (not low-sodium or reduced-sodium). Regular tomato and vegetable juice (not low-sodium or reduced-sodium). Frozen vegetables in sauces. Meats and other protein foods  Meat or fish that is salted, canned, smoked, spiced, or pickled. Bacon, ham, sausage, hotdogs, corned beef, chipped beef, packaged lunch meats, salt pork, jerky, pickled herring, anchovies, regular canned tuna, sardines, salted nuts. Dairy  Processed cheese and cheese spreads. Cheese curds. Blue cheese. Feta cheese. String cheese. Regular cottage cheese. Buttermilk. Canned milk. Fats and oils  Salted butter. Regular margarine. Ghee. Bacon fat. Seasonings and other foods  Onion salt, garlic salt, seasoned salt, table salt, and sea salt. Canned and packaged gravies. Worcestershire sauce. Tartar sauce. Barbecue sauce. Teriyaki sauce. Soy sauce, including reduced-sodium. Steak sauce. Fish sauce. Oyster sauce. Cocktail sauce. Horseradish that you find on the shelf. Regular ketchup and mustard. Meat flavorings and tenderizers. Bouillon cubes. Hot sauce and Tabasco sauce. Premade or packaged marinades. Premade or packaged taco seasonings. Relishes. Regular salad dressings. Salsa. Potato and tortilla chips. Corn chips and puffs. Salted popcorn and pretzels. Canned or dried soups. Pizza. Frozen entrees and pot pies. Summary  Eating less sodium can help lower your blood pressure, reduce swelling, and protect your heart, liver, and kidneys.  Most people on this plan should limit their sodium intake to 1,500-2,000 mg (milligrams) of sodium each day.  Canned, boxed, and frozen foods are high in sodium. Restaurant foods, fast foods, and pizza  are also very high in sodium. You also get sodium by adding salt to food.  Try to cook at home, eat more fresh fruits and vegetables, and eat less fast food, canned, processed, or prepared foods. This information is not intended to replace advice given to you by your health care provider. Make sure you discuss any questions you have with your health care provider. Document Released: 10/25/2001 Document Revised: 04/28/2016 Document Reviewed: 04/28/2016 Elsevier Interactive Patient Education  2017 [employer].

## 2016-10-11 LAB — BMP8+EGFR
BUN/Creatinine Ratio: 13 (ref 9–20)
BUN: 21 mg/dL — ABNORMAL HIGH (ref 6–20)
CALCIUM: 9.5 mg/dL (ref 8.7–10.2)
CO2: 23 mmol/L (ref 18–29)
Chloride: 103 mmol/L (ref 96–106)
Creatinine, Ser: 1.6 mg/dL — ABNORMAL HIGH (ref 0.76–1.27)
GFR calc non Af Amer: 57 mL/min/{1.73_m2} — ABNORMAL LOW (ref >59)
GFR, EST AFRICAN AMERICAN: 65 mL/min/{1.73_m2} (ref >59)
GLUCOSE: 99 mg/dL (ref 65–99)
POTASSIUM: 4.2 mmol/L (ref 3.5–5.2)
Sodium: 139 mmol/L (ref 134–144)

## 2016-10-16 ENCOUNTER — Telehealth: Payer: Self-pay | Admitting: Family Medicine

## 2016-10-16 ENCOUNTER — Telehealth: Payer: Self-pay

## 2016-10-16 NOTE — Telephone Encounter (Signed)
Pt calling to request results from labs from his last visit. Requests a call back. Thank you.

## 2016-10-16 NOTE — Telephone Encounter (Signed)
Pt was called and a message was left informing pt to return phone call for lab results. 

## 2016-10-22 NOTE — Telephone Encounter (Signed)
Pt was called and informed of lab results. 

## 2016-11-26 ENCOUNTER — Other Ambulatory Visit: Payer: Self-pay | Admitting: Pharmacist

## 2016-11-26 MED ORDER — DIOVAN 320 MG PO TABS: 320.0000 mg | ORAL_TABLET | Freq: Every day | ORAL | 2 refills | Status: DC

## 2016-11-26 MED FILL — AMLODIPINE BESYLATE 10 MG T: 10 | 30 days supply | Qty: 30 | Fill #1

## 2016-11-26 MED FILL — DIOVAN 320 MG TABLET: 320 | 30 days supply | Qty: 30 | Fill #0

## 2016-11-26 MED FILL — FUROSEMIDE 40 MG TABLET: 40 | 30 days supply | Qty: 15 | Fill #1

## 2016-11-26 MED FILL — VALSARTAN 320 MG TABLET: 320 | 30 days supply | Qty: 30 | Fill #1

## 2016-11-26 MED FILL — CARVEDILOL 12.5 MG TABLET: 12.5 | 15 days supply | Qty: 30 | Fill #1

## 2016-11-26 NOTE — Telephone Encounter (Signed)
Prior authorization request received for valsartan. Diovan (the brand) is preferred so switched to Diovan and then completed the prior auth for ARB (patient failed ACEI). Confirmation # [id number]

## 2017-01-20 ENCOUNTER — Other Ambulatory Visit: Payer: Self-pay | Admitting: Cardiovascular Disease

## 2017-01-20 MED FILL — CARVEDILOL 12.5 MG TABLET: 12.5 | 15 days supply | Qty: 30 | Fill #2

## 2017-01-20 MED FILL — FUROSEMIDE 40 MG TABLET: 40 | 30 days supply | Qty: 15 | Fill #2

## 2017-01-20 MED FILL — DIOVAN 320 MG TABLET: 320 | 30 days supply | Qty: 30 | Fill #1

## 2017-01-20 MED FILL — SIMVASTATIN 20 MG TABLET: 20 | 30 days supply | Qty: 30 | Fill #0

## 2017-01-20 MED FILL — AMLODIPINE BESYLATE 10 MG T: 10 | 30 days supply | Qty: 30 | Fill #2

## 2017-01-20 NOTE — Telephone Encounter (Signed)
Rx(s) sent to pharmacy electronically.  

## 2017-03-17 MED FILL — SIMVASTATIN 20 MG TABLET: 20 | 30 days supply | Qty: 30 | Fill #1

## 2017-03-17 MED FILL — DIOVAN 320 MG TABLET: 320 | 30 days supply | Qty: 30 | Fill #2

## 2017-03-17 MED FILL — FUROSEMIDE 40 MG TABLET: 40 | 30 days supply | Qty: 15 | Fill #3

## 2017-03-17 MED FILL — CARVEDILOL 12.5 MG TABLET: 12.5 | 15 days supply | Qty: 30 | Fill #3

## 2017-03-17 MED FILL — AMLODIPINE BESYLATE 10 MG T: 10 | 30 days supply | Qty: 30 | Fill #3

## 2017-06-04 MED FILL — SIMVASTATIN 20 MG TABLET: 20 | 30 days supply | Qty: 30 | Fill #2

## 2017-06-04 MED FILL — FUROSEMIDE 40 MG TAB: 40 | 30 days supply | Qty: 15 | Fill #4

## 2017-06-04 MED FILL — CARVEDILOL 12.5 MG TABLET: 12.5 | 15 days supply | Qty: 30 | Fill #4

## 2017-06-04 MED FILL — AMLODIPINE BESYLATE 10 MG T: 10 | 30 days supply | Qty: 30 | Fill #4

## 2017-06-04 MED FILL — VALSARTAN 320 MG TABS: 320 | 30 days supply | Qty: 30 | Fill #2

## 2017-07-31 ENCOUNTER — Other Ambulatory Visit: Payer: Self-pay | Admitting: Cardiovascular Disease

## 2017-07-31 MED FILL — AMLODIPINE BESYLATE 10 MG T: 10 | 30 days supply | Qty: 30 | Fill #5

## 2017-07-31 MED FILL — VALSARTAN 320 MG TAB: 320 | 30 days supply | Qty: 30 | Fill #3

## 2017-07-31 MED FILL — CARVEDILOL 12.5 MG TABLET: 12.5 | 15 days supply | Qty: 30 | Fill #5

## 2017-07-31 MED FILL — SIMVASTATIN 20 MG TABLET: 20 | 30 days supply | Qty: 30 | Fill #0

## 2017-07-31 NOTE — Telephone Encounter (Signed)
REFILL 

## 2017-09-22 ENCOUNTER — Other Ambulatory Visit: Payer: Self-pay | Admitting: Cardiovascular Disease

## 2017-11-13 ENCOUNTER — Emergency Department (HOSPITAL_COMMUNITY): Payer: Medicaid Other

## 2017-11-13 ENCOUNTER — Other Ambulatory Visit: Payer: Self-pay

## 2017-11-13 ENCOUNTER — Encounter (HOSPITAL_COMMUNITY): Payer: Self-pay | Admitting: Emergency Medicine

## 2017-11-13 ENCOUNTER — Observation Stay (HOSPITAL_COMMUNITY)
Admission: EM | Admit: 2017-11-13 | Discharge: 2017-11-14 | Disposition: A | Discharge status: Home / Self Care | Payer: Medicaid Other | Attending: Family Medicine | Admitting: Family Medicine

## 2017-11-13 ENCOUNTER — Ambulatory Visit (HOSPITAL_COMMUNITY): Payer: Medicaid Other

## 2017-11-13 DIAGNOSIS — Z841 Family history of disorders of kidney and ureter: Secondary | ICD-10-CM | POA: Insufficient documentation

## 2017-11-13 DIAGNOSIS — N182 Chronic kidney disease, stage 2 (mild): Secondary | ICD-10-CM | POA: Diagnosis present

## 2017-11-13 DIAGNOSIS — R05 Cough: Secondary | ICD-10-CM | POA: Diagnosis not present

## 2017-11-13 DIAGNOSIS — I160 Hypertensive urgency: Secondary | ICD-10-CM

## 2017-11-13 DIAGNOSIS — Z79899 Other long term (current) drug therapy: Secondary | ICD-10-CM | POA: Insufficient documentation

## 2017-11-13 DIAGNOSIS — E785 Hyperlipidemia, unspecified: Secondary | ICD-10-CM | POA: Diagnosis not present

## 2017-11-13 DIAGNOSIS — R748 Abnormal levels of other serum enzymes: Secondary | ICD-10-CM

## 2017-11-13 DIAGNOSIS — I429 Cardiomyopathy, unspecified: Secondary | ICD-10-CM | POA: Diagnosis not present

## 2017-11-13 DIAGNOSIS — N179 Acute kidney failure, unspecified: Secondary | ICD-10-CM | POA: Insufficient documentation

## 2017-11-13 DIAGNOSIS — R059 Cough, unspecified: Secondary | ICD-10-CM

## 2017-11-13 DIAGNOSIS — R0602 Shortness of breath: Secondary | ICD-10-CM | POA: Diagnosis not present

## 2017-11-13 DIAGNOSIS — Z888 Allergy status to other drugs, medicaments and biological substances status: Secondary | ICD-10-CM | POA: Diagnosis not present

## 2017-11-13 DIAGNOSIS — I5043 Acute on chronic combined systolic (congestive) and diastolic (congestive) heart failure: Secondary | ICD-10-CM | POA: Diagnosis not present

## 2017-11-13 DIAGNOSIS — N183 Chronic kidney disease, stage 3 (moderate): Secondary | ICD-10-CM | POA: Diagnosis not present

## 2017-11-13 DIAGNOSIS — I428 Other cardiomyopathies: Secondary | ICD-10-CM

## 2017-11-13 DIAGNOSIS — Z8249 Family history of ischemic heart disease and other diseases of the circulatory system: Secondary | ICD-10-CM | POA: Diagnosis not present

## 2017-11-13 DIAGNOSIS — Z833 Family history of diabetes mellitus: Secondary | ICD-10-CM | POA: Diagnosis not present

## 2017-11-13 DIAGNOSIS — Z87891 Personal history of nicotine dependence: Secondary | ICD-10-CM | POA: Diagnosis not present

## 2017-11-13 DIAGNOSIS — I5042 Chronic combined systolic (congestive) and diastolic (congestive) heart failure: Secondary | ICD-10-CM | POA: Diagnosis not present

## 2017-11-13 DIAGNOSIS — Z9114 Patient's other noncompliance with medication regimen: Secondary | ICD-10-CM | POA: Insufficient documentation

## 2017-11-13 DIAGNOSIS — Z9889 Other specified postprocedural states: Secondary | ICD-10-CM | POA: Diagnosis not present

## 2017-11-13 DIAGNOSIS — Z8673 Personal history of transient ischemic attack (TIA), and cerebral infarction without residual deficits: Secondary | ICD-10-CM | POA: Insufficient documentation

## 2017-11-13 DIAGNOSIS — R778 Other specified abnormalities of plasma proteins: Secondary | ICD-10-CM | POA: Diagnosis present

## 2017-11-13 DIAGNOSIS — I10 Essential (primary) hypertension: Secondary | ICD-10-CM | POA: Diagnosis present

## 2017-11-13 DIAGNOSIS — N189 Chronic kidney disease, unspecified: Secondary | ICD-10-CM

## 2017-11-13 DIAGNOSIS — I130 Hypertensive heart and chronic kidney disease with heart failure and stage 1 through stage 4 chronic kidney disease, or unspecified chronic kidney disease: Secondary | ICD-10-CM | POA: Insufficient documentation

## 2017-11-13 DIAGNOSIS — F17200 Nicotine dependence, unspecified, uncomplicated: Secondary | ICD-10-CM | POA: Diagnosis present

## 2017-11-13 DIAGNOSIS — R7989 Other specified abnormal findings of blood chemistry: Secondary | ICD-10-CM

## 2017-11-13 LAB — I-STAT TROPONIN, ED: TROPONIN I, POC: 0.13 ng/mL — AB (ref 0.00–0.08)

## 2017-11-13 LAB — BASIC METABOLIC PANEL
ANION GAP: 9 (ref 5–15)
BUN: 22 mg/dL — AB (ref 6–20)
CALCIUM: 8.9 mg/dL (ref 8.9–10.3)
CO2: 25 mmol/L (ref 22–32)
Chloride: 105 mmol/L (ref 98–111)
Creatinine, Ser: 1.92 mg/dL — ABNORMAL HIGH (ref 0.61–1.24)
GFR calc Af Amer: 52 mL/min — ABNORMAL LOW (ref >60)
GFR calc non Af Amer: 45 mL/min — ABNORMAL LOW (ref >60)
GLUCOSE: 107 mg/dL — AB (ref 70–99)
Potassium: 3.7 mmol/L (ref 3.5–5.1)
SODIUM: 139 mmol/L (ref 135–145)

## 2017-11-13 LAB — D-DIMER, QUANTITATIVE: D-Dimer, Quant: 0.76 ug/mL-FEU — ABNORMAL HIGH (ref 0.00–0.50)

## 2017-11-13 LAB — TROPONIN I
Troponin I: 0.1 ng/mL (ref <0.03)
Troponin I: 0.09 ng/mL (ref <0.03)

## 2017-11-13 LAB — CBC
HCT: 45.3 % (ref 39.0–52.0)
HEMOGLOBIN: 14.8 g/dL (ref 13.0–17.0)
MCH: 27.7 pg (ref 26.0–34.0)
MCHC: 32.7 g/dL (ref 30.0–36.0)
MCV: 84.8 fL (ref 78.0–100.0)
Platelets: 265 10*3/uL (ref 150–400)
RBC: 5.34 MIL/uL (ref 4.22–5.81)
RDW: 14.8 % (ref 11.5–15.5)
WBC: 6.6 10*3/uL (ref 4.0–10.5)

## 2017-11-13 LAB — BRAIN NATRIURETIC PEPTIDE: B Natriuretic Peptide: 180.6 pg/mL — ABNORMAL HIGH (ref 0.0–100.0)

## 2017-11-13 MED ORDER — ASPIRIN 81 MG PO CHEW
324.0000 mg | CHEWABLE_TABLET | Freq: Once | ORAL | Status: AC
Start: 2017-11-13 — End: 2017-11-13
  Administered 2017-11-13: 324 mg via ORAL
  Filled 2017-11-13: qty 4

## 2017-11-13 MED ORDER — CARVEDILOL 12.5 MG PO TABS
12.5000 mg | ORAL_TABLET | Freq: Two times a day (BID) | ORAL | Status: DC
  Administered 2017-11-13 – 2017-11-14 (×3): 12.5 mg via ORAL
  Filled 2017-11-13 (×3): qty 1

## 2017-11-13 MED ORDER — IRBESARTAN 300 MG PO TABS
150.0000 mg | ORAL_TABLET | Freq: Every day | ORAL | Status: DC
Start: 2017-11-13 — End: 2017-11-14
  Administered 2017-11-13 – 2017-11-14 (×2): 150 mg via ORAL
  Filled 2017-11-13 (×2): qty 1

## 2017-11-13 MED ORDER — HEPARIN SODIUM (PORCINE) 5000 UNIT/ML IJ SOLN
5000.0000 [IU] | Freq: Three times a day (TID) | INTRAMUSCULAR | Status: DC
  Administered 2017-11-13 – 2017-11-14 (×2): 5000 [IU] via SUBCUTANEOUS
  Filled 2017-11-13 (×2): qty 1

## 2017-11-13 MED ORDER — FUROSEMIDE 20 MG PO TABS
20.0000 mg | ORAL_TABLET | Freq: Every day | ORAL | Status: DC
  Administered 2017-11-13 – 2017-11-14 (×2): 20 mg via ORAL
  Filled 2017-11-13 (×2): qty 1

## 2017-11-13 MED ORDER — HYDRALAZINE HCL 20 MG/ML IJ SOLN
10.0000 mg | Freq: Once | INTRAMUSCULAR | Status: AC
  Administered 2017-11-13: 10 mg via INTRAVENOUS
  Filled 2017-11-13: qty 1

## 2017-11-13 MED ORDER — AMLODIPINE BESYLATE 10 MG PO TABS
10.0000 mg | ORAL_TABLET | Freq: Every day | ORAL | Status: DC
  Administered 2017-11-13 – 2017-11-14 (×3): 10 mg via ORAL
  Filled 2017-11-13: qty 1
  Filled 2017-11-13: qty 2
  Filled 2017-11-13: qty 1

## 2017-11-13 MED ORDER — SIMVASTATIN 20 MG PO TABS
20.0000 mg | ORAL_TABLET | Freq: Every day | ORAL | Status: DC
  Administered 2017-11-13 – 2017-11-14 (×2): 20 mg via ORAL
  Filled 2017-11-13 (×2): qty 1

## 2017-11-13 MED ORDER — TECHNETIUM TO 99M ALBUMIN AGGREGATED
4.2000 | Freq: Once | INTRAVENOUS | Status: AC | PRN
  Administered 2017-11-13: 4.2 via INTRAVENOUS

## 2017-11-13 MED ORDER — ASPIRIN EC 81 MG PO TBEC
81.0000 mg | DELAYED_RELEASE_TABLET | Freq: Every day | ORAL | Status: DC
  Administered 2017-11-14: 81 mg via ORAL
  Filled 2017-11-13 (×2): qty 1

## 2017-11-13 MED ORDER — TECHNETIUM TC 99M DIETHYLENETRIAME-PENTAACETIC ACID
31.0000 | Freq: Once | INTRAVENOUS | Status: AC | PRN
  Administered 2017-11-13: 31 via RESPIRATORY_TRACT

## 2017-11-13 MED ORDER — HYDRALAZINE HCL 20 MG/ML IJ SOLN: 5.0000 mg | Freq: Four times a day (QID) | INTRAMUSCULAR | Status: DC | PRN

## 2017-11-13 NOTE — ED Notes (Signed)
Attempted to call report x 1  

## 2017-11-13 NOTE — ED Notes (Signed)
Attempted to call report x2

## 2017-11-13 NOTE — ED Notes (Signed)
Pt reports has been out of all medications x1 month. BP 183/124. Also reports SOB worsened with lying flat.

## 2017-11-13 NOTE — ED Triage Notes (Signed)
Pt to ER for evaluation of one month coughing spells without sputum production and difficulty breathing that has progressively worsened. Reports smokes 1/2 pack/day and marijuana use. Reports some swelling to lower extremities. Denies pain. Pt in NAD.

## 2017-11-13 NOTE — ED Notes (Signed)
Pt transported to [facility]. 

## 2017-11-13 NOTE — H&P (Signed)
TRH H&P   Patient Demographics:    Bueford Arp, is a 32 y.o. male  MRN: [id number]   DOB - [birth year]  Admit Date - 11/13/2017  Outpatient Primary MD for the patient is Dessa Phi, MD  Referring MD/NP/PA: Dr Deretha Emory  Outpatient Specialists: [facility]  Patient coming from: home  Chief Complaint  Patient presents with  . Cough  . Shortness of Breath      HPI:    Desmen Schoffstall  is a 32 y.o. 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, with the most recent echo was with dense of recurrence, patient presents as he ran out of his medications, reports he had a refill for 1 year, ran out of it last April, has been for last 2 months with no medication, upon further questioning he does endorse some dyspnea, reported for the last week or so, and orthopnea, denies any leg swelling, pain, hemoptysis, and recent travel long distance driving or flying, as well de denies fever, chills, nausea or vomiting, and ED work-up significant for worsening creatinine from 1.6-1.9, his point-of-care troponins was elevated at 0.13, EKG was nonacute, he received full dose aspirin, I was called to admit for further evaluation regarding his dyspnea and elevated troponin.    Review of systems:    In addition to the HPI above,  No Fever-chills, No Headache, No changes with Vision or hearing, No problems swallowing food or Liquids, No Chest pain, Cough, he does report some orthopnea No Abdominal pain, No Nausea or Vommitting, Bowel movements are regular, No Blood in stool or Urine, No dysuria, No new skin rashes or bruises, No new joints pains-aches,  No new weakness, tingling, numbness in any extremity, No recent weight gain or loss, No polyuria, polydypsia or  polyphagia, No significant Mental Stressors.  A full 10 point Review of Systems was done, except as stated above, all other Review of Systems were negative.   With Past History of the following :    Past Medical History:  Diagnosis Date  . At risk for sleep apnea    STOP-BANG= 4      SENT TO PCP 11-10-2014  . CKD (chronic kidney disease), stage III (HCC)   . Dyspnea on exertion   . Hyperlipidemia   . Hypertension    at age 2  . Malignant hypertension    dx age 67  . Nonischemic cardiomyopathy (HCC)   . Penile adhesions   . Systolic and diastolic CHF, chronic (HCC) dx 09-18-2014   cardiologist-  dr Rachelle Hora croitoru----  ef 25-30% per last note 11-03-2014      Past Surgical History:  Procedure Laterality Date  . CIRCUMCISION REVISION N/A 11/14/2014   Procedure: CIRCUMCISION REVISION, LYSIS OF ADHESIONS;  Surgeon: Su Grand, MD;  Location: [facility];  Service: Urology;  Laterality: N/A;  . NO PAST SURGERIES    . TRANSTHORACIC ECHOCARDIOGRAM  09-19-2014  dr croitoru   moderate LVH,  ef 35% (per dr croitoru note, ef 25-30%), diffuse hypokinesis of basal inferior myocardium,  trivial AR,  mild MR      Social History:     Social History   Tobacco Use  . Smoking status: Former Smoker    Packs/day: 0.50    Years: 10.00    Pack years: 5.00    Types: Cigarettes    Last attempt to quit: 04/19/2014    Years since quitting: 3.5  . Smokeless tobacco: Never Used  Substance Use Topics  . Alcohol use: Yes    Alcohol/week: 0.0 oz    Comment: 3 beers per week      Lives -at home  Mobility -dependent     Family History :     Family History  Problem Relation Age of Onset  . Diabetes Mother   . Hypertension Mother   . Kidney disease Brother 6       on dialysis. never went to the doctor      Home Medications:   Prior to Admission medications   Medication Sig Start Date End Date Taking? Authorizing Provider  amLODipine (NORVASC) 10 MG tablet Take 1  tablet (10 mg total) by mouth daily. 09/08/16  Yes Funches, Gerilyn Nestle, MD  carvedilol (COREG) 12.5 MG tablet Take 1 tablet (12.5 mg total) by mouth 2 (two) times daily with a meal. 09/08/16  Yes Funches, Josalyn, MD  DIOVAN 320 MG tablet Take 1 tablet (320 mg total) by mouth daily. 11/26/16  Yes Funches, Josalyn, MD  furosemide (LASIX) 40 MG tablet Take 0.5 tablets (20 mg total) by mouth daily. 09/08/16  Yes Funches, Josalyn, MD  simvastatin (ZOCOR) 20 MG tablet Take 1 tablet (20 mg total) by mouth daily. Please contact our office for an appointment for continuation of medication refills 09/22/17  Yes Croitoru, Mihai, MD  amLODipine (NORVASC) 5 MG tablet Take 1 tablet (5 mg total) by mouth daily. Please contact our office for an appointment for continuation of medication refills Patient not taking: Reported on 11/13/2017 09/22/17   Croitoru, Rachelle Hora, MD     Allergies:     Allergies  Allergen Reactions  . Isosorbide Nitrate Other (See Comments)    headaches  . Lisinopril Cough     Physical Exam:   Vitals  Blood pressure (!) 165/106, pulse 81, temperature 98.2 F (36.8 C), temperature source Oral, resp. rate 12, SpO2 97 %.   1. General developed male, lying in bed in NAD,    2. Normal affect and insight, Not Suicidal or Homicidal, Awake Alert, Oriented X 3.  3. No F.N deficits, ALL C.Nerves Intact, Strength 5/5 all 4 extremities, Sensation intact all 4 extremities, Plantars down going.  4. Ears and Eyes appear Normal, Conjunctivae clear, PERRLA. Moist Oral Mucosa.  5. Supple Neck, No JVD, No cervical lymphadenopathy appriciated, No Carotid Bruits.  6. Symmetrical Chest wall movement, Good air movement bilaterally, CTAB.  7. RRR, No Gallops, Rubs or Murmurs, No Parasternal Heave.  8. Positive Bowel Sounds, Abdomen Soft, No tenderness, No organomegaly appriciated,No rebound -guarding or rigidity.  9.  No Cyanosis, Normal Skin Turgor, No Skin Rash or Bruise.  10. Good muscle tone,  joints  appear normal , no effusions, Normal ROM.  11. No Palpable Lymph Nodes in Neck or Axillae   Data Review:    CBC Recent Labs  Lab 11/13/17 0947  WBC 6.6  HGB 14.8  HCT 45.3  PLT 265  MCV 84.8  MCH 27.7  MCHC 32.7  RDW 14.8   ------------------------------------------------------------------------------------------------------------------  Chemistries  Recent Labs  Lab 11/13/17 0947  NA 139  K 3.7  CL 105  CO2 25  GLUCOSE 107*  BUN 22*  CREATININE 1.92*  CALCIUM 8.9   ------------------------------------------------------------------------------------------------------------------ CrCl cannot be calculated (Unknown ideal weight.). ------------------------------------------------------------------------------------------------------------------ No results for input(s): TSH, T4TOTAL, T3FREE, THYROIDAB in the last 72 hours.  Invalid input(s): FREET3  Coagulation profile No results for input(s): INR, PROTIME in the last 168 hours. ------------------------------------------------------------------------------------------------------------------- No results for input(s): DDIMER in the last 72 hours. -------------------------------------------------------------------------------------------------------------------  Cardiac Enzymes No results for input(s): CKMB, TROPONINI, MYOGLOBIN in the last 168 hours.  Invalid input(s): CK ------------------------------------------------------------------------------------------------------------------    Component Value Date/Time   BNP 391.7 (H) 09/18/2014 0900     ---------------------------------------------------------------------------------------------------------------  Urinalysis No results found for: COLORURINE, APPEARANCEUR, LABSPEC, PHURINE, GLUCOSEU, HGBUR, BILIRUBINUR, KETONESUR, PROTEINUR, UROBILINOGEN, NITRITE,  LEUKOCYTESUR  ----------------------------------------------------------------------------------------------------------------   Imaging Results:    Dg Chest 2 View  Result Date: 11/13/2017 CLINICAL DATA:  Worsening shortness of breath. EXAM: CHEST - 2 VIEW COMPARISON:  Chest x-ray dated Sep 18, 2014. FINDINGS: Stable cardiomegaly. Normal pulmonary vascularity. No focal consolidation, pleural effusion, or pneumothorax. No acute osseous abnormality. IMPRESSION: Stable cardiomegaly.  No active cardiopulmonary disease. Electronically Signed   By: Obie Dredge M.D.   On: 11/13/2017 10:03    My personal review of EKG: Rhythm NSR, 101, QTc 510   Assessment & Plan:    Active Problems:   CKD (chronic kidney disease) stage 2, GFR 60-89 ml/min   Elevated troponin   Hyperlipidemia   Nonischemic cardiomyopathy (HCC)   Chronic combined systolic and diastolic CHF (congestive heart failure) (HCC)   Hypertension   Smoking  Elevated troponin -Patient has elevated point-of-care in ED, patient never had chest pain, his EKG is nonacute, but given his risk factors he will be admitted for observation for further work-up, I will cycle 3 standard troponins, and I will obtain 2D echo in a.m. to evaluate for any regional wall motion abnormalities, he already received aspirin. -I think elevated troponin most likely mild ischemia related to symptomatic dyspnea and hypertensive urgency as well worsening renal function  Dyspnea -Most likely in the setting of hypertensive urgency, no evidence of volume overload on physical exam or imaging. - his D dimers is elevated at 0.76, will obtain V/Q scan.  AKI on CKD stage III -Continue to monitor, this may be a new baseline, I will hold his Lasix and diovan.(Have been taken for last couple months.  Hypertensive urgency -Pressure uncontrolled in ED, this is most likely causing his dyspnea, his home amlodipine and Coreg now, I will hold Diovan in the setting of  worsening renal function. -We will start on PRN hydralazine  Chronic combined systolic and diastolic CHF -Patient appears to be euvolemic currently, I will hold on Lasix in the setting of renal failure, he will be resumed back on his Coreg, I will hold Diovan in the setting of renal failure, likely will need to be started on BiDil and Aldactone, will await repeat echo.  Medication noncompliance -Run out of his meds for last 50-month, I have discussed with him at length medication compliance.  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     DVT Prophylaxis Heparin   AM Labs  Ordered, also please review Full Orders  Family Communication: Admission, patients condition and plan of care including tests being ordered have been discussed with the patient  who indicate understanding and agree with the plan and Code Status.  Code Status Full  Likely DC to  Home  Condition GUARDED    Consults called: Cards by ED  Admission status: observation  Time spent in minutes : 55 minutes   Huey Bienenstock M.D on 11/13/2017 at 1:58 PM  Between 7am to 7pm - Pager - [phone]. After 7pm go to [email or web address] - password [facility]  [facility] - Office  [phone]

## 2017-11-13 NOTE — Consult Note (Signed)
Cardiology Consultation:   Patient ID: Shawn [name]; [id number]; April 07, 1986   Admit date: 11/13/2017 Date of Consult: 11/13/2017  Primary Care Provider: Dessa Phi, MD Primary Cardiologist: Dr Royann Shivers    Patient Profile:   Shawn [name] is a 32 y.o. 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  History of Present Illness:   Patient was initially diagnosed with congestive heart failure in May 2016 with ejection fraction of around 25%.  In July 2016 he had an embolic CVA and echocardiogram showed a left ventricular apical thrombus.  He was treated for malignant hypertension which was felt to be the etiology of his cardiomyopathy.  He was also treated with Coumadin. Last echocardiogram November 2017 showed ejection fraction 40 to 45% with moderate diastolic dysfunction and trace aortic insufficiency.  Patient ran out of his medications 2 months ago by report.  He has developed dyspnea over the past 1-1/2 weeks similar to his symptoms prior to heart failure in the past by his report.  He notices dyspnea when sitting or lying down.  He denies dyspnea on exertion, pedal edema, chest pain or syncope.  He has been admitted and cardiology asked to evaluate.  Past Medical History:  Diagnosis Date  . At risk for sleep apnea    STOP-BANG= 4      SENT TO PCP 11-10-2014  . CKD (chronic kidney disease), stage III (HCC)   . Dyspnea on exertion   . Hyperlipidemia   . Hypertension    at age 49  . Malignant hypertension    dx age 44  . Nonischemic cardiomyopathy (HCC)   . Penile adhesions   . Systolic and diastolic CHF, chronic (HCC) dx 09-18-2014   cardiologist-  dr Rachelle Hora croitoru----  ef 25-30% per last note 11-03-2014    Past Surgical History:  Procedure Laterality Date  . CIRCUMCISION  REVISION N/A 11/14/2014   Procedure: CIRCUMCISION REVISION, LYSIS OF ADHESIONS;  Surgeon: Su Grand, MD;  Location: [facility];  Service: Urology;  Laterality: N/A;  . NO PAST SURGERIES    . TRANSTHORACIC ECHOCARDIOGRAM  09-19-2014  dr croitoru   moderate LVH,  ef 35% (per dr croitoru note, ef 25-30%), diffuse hypokinesis of basal inferior myocardium,  trivial AR,  mild MR      Inpatient Medications: Scheduled Meds: . amLODipine  10 mg Oral Daily  . [START ON 11/14/2017] aspirin EC  81 mg Oral Daily  . carvedilol  12.5 mg Oral BID WC  . heparin  5,000 Units Subcutaneous Q8H  . simvastatin  20 mg Oral Daily   Continuous Infusions:  PRN Meds: hydrALAZINE  Allergies:    Allergies  Allergen Reactions  . Isosorbide Nitrate Other (See Comments)    headaches  . Lisinopril Cough    Social History:   Social History   Socioeconomic History  . Marital status: Single    Spouse name: Not on file  . Number of children: Not on file  . Years of education: Not on file  . Highest education level: Not on file  Occupational History  . Not on file  Social Needs  . Financial resource strain: Not on file  . Food insecurity:    Worry: Not on file    Inability: Not on file  . Transportation needs:    Medical: Not on  file    Non-medical: Not on file  Tobacco Use  . Smoking status: Former Smoker    Packs/day: 0.50    Years: 10.00    Pack years: 5.00    Types: Cigarettes    Last attempt to quit: 04/19/2014    Years since quitting: 3.5  . Smokeless tobacco: Never Used  Substance and Sexual Activity  . Alcohol use: Yes    Alcohol/week: 0.0 oz    Comment: 3 beers per week   . Drug use: No    Types: Marijuana    Comment: Last used marijuana 02/18/2015  . Sexual activity: Not on file  Lifestyle  . Physical activity:    Days per week: Not on file    Minutes per session: Not on file  . Stress: Not on file  Relationships  . Social connections:    Talks on phone: Not  on file    Gets together: Not on file    Attends religious service: Not on file    Active member of club or organization: Not on file    Attends meetings of clubs or organizations: Not on file    Relationship status: Not on file  . Intimate partner violence:    Fear of current or ex partner: Not on file    Emotionally abused: Not on file    Physically abused: Not on file    Forced sexual activity: Not on file  Other Topics Concern  . Not on file  Social History Narrative  . Not on file    Family History:   Family History  Problem Relation Age of Onset  . Diabetes Mother   . Hypertension Mother   . Kidney disease Brother 26       on dialysis. never went to the doctor      ROS:  Please see the history of present illness.  Patient denies fevers, chills, productive cough or hemoptysis. All other ROS reviewed and negative.     Physical Exam/Data:   Vitals:   11/13/17 1400 11/13/17 1515 11/13/17 1641 11/13/17 1650  BP: (!) 141/99 (!) 149/81  (!) 159/124  Pulse: 87 83  92  Resp: 19 15  20   Temp:      TempSrc:      SpO2: 98% 99%  100%  Weight:   300 lb (136.1 kg)   Height:   5\' 11"  (1.803 m)    No intake or output data in the 24 hours ending 11/13/17 1652 Filed Weights   11/13/17 1641  Weight: 300 lb (136.1 kg)   Body mass index is 41.84 kg/m.  General:  Well nourished, well developed, in no acute distress HEENT: normal Lymph: no adenopathy Neck: no JVD Endocrine:  No thryomegaly Vascular: No carotid bruits; FA pulses 2+ bilaterally without bruits  Cardiac:  normal S1, S2; RRR; no murmur  Lungs:  clear to auscultation bilaterally, no wheezing, rhonchi or rales  Abd: soft, nontender, no hepatomegaly  Ext: no edema Musculoskeletal:  No deformities, BUE and BLE strength normal and equal Skin: warm and dry  Neuro:  CNs 2-12 intact, no focal abnormalities noted Psych:  Normal affect   EKG:  The EKG was personally reviewed and demonstrates: Normal sinus rhythm, left  ventricular hypertrophy.  Laboratory Data:  Chemistry Recent Labs  Lab 11/13/17 0947  NA 139  K 3.7  CL 105  CO2 25  GLUCOSE 107*  BUN 22*  CREATININE 1.92*  CALCIUM 8.9  GFRNONAA 45*  GFRAA 52*  ANIONGAP 9    Hematology Recent Labs  Lab 11/13/17 0947  WBC 6.6  RBC 5.34  HGB 14.8  HCT 45.3  MCV 84.8  MCH 27.7  MCHC 32.7  RDW 14.8  PLT 265   Cardiac Enzymes Recent Labs  Lab 11/13/17 1342  TROPONINI 0.10*    Recent Labs  Lab 11/13/17 0953  TROPIPOC 0.13*    BNP Recent Labs  Lab 11/13/17 1342  BNP 180.6*    DDimer  Recent Labs  Lab 11/13/17 1342  DDIMER 0.76*    Radiology/Studies:  Dg Chest 2 View  Result Date: 11/13/2017 CLINICAL DATA:  Worsening shortness of breath. EXAM: CHEST - 2 VIEW COMPARISON:  Chest x-ray dated Sep 18, 2014. FINDINGS: Stable cardiomegaly. Normal pulmonary vascularity. No focal consolidation, pleural effusion, or pneumothorax. No acute osseous abnormality. IMPRESSION: Stable cardiomegaly.  No active cardiopulmonary disease. Electronically Signed   By: Obie Dredge M.D.   On: 11/13/2017 10:03   Nm Pulmonary Perf And Vent  Result Date: 11/13/2017 CLINICAL DATA:  Shortness of breath. History of congestive heart failure with low ejection fraction. EXAM: NUCLEAR MEDICINE VENTILATION - PERFUSION LUNG SCAN TECHNIQUE: Ventilation images were obtained in multiple projections using inhaled aerosol Tc-59m DTPA. Perfusion images were obtained in multiple projections after intravenous injection of Tc-41m-MAA. RADIOPHARMACEUTICALS:  31.0 mCi of Tc-28m DTPA aerosol inhalation and 4.2 mCi Tc14m-MAA IV COMPARISON:  Chest radiography same day FINDINGS: Ventilation: Normal Perfusion: Normal IMPRESSION: Normal VQ scan. Electronically Signed   By: Paulina Fusi M.D.   On: 11/13/2017 16:29    Assessment and Plan:   1. Acute on chronic combined systolic/diastolic congestive heart failure-patient not markedly volume overloaded on examination but  describes worsening dyspnea.  This is likely secondary to mild congestive heart failure.  We will resume Lasix 20 mg daily which was his home dose.  He will need to follow low-sodium diet and fluid restriction. 2. Nonischemic cardiomyopathy-patient has a history of cardiomyopathy felt secondary to malignant hypertension.  He was not taking his medications on admission as he "ran out".  We will resume carvedilol and amlodipine.  I will resume ARB.  Follow blood pressure and adjust regimen as needed.  Repeat echocardiogram. 3. Hypertension-patient with markedly elevated blood pressure at time of admission but was not taking his medications.  Follow blood pressure with reinstitution of above regimen and adjust as needed. 4. Tobacco abuse-patient counseled on discontinuing. 5. Chronic stage III kidney disease-patient has baseline creatinine of 1.5.  It is mildly increased compared to previous.  This is likely secondary to hypertension.  I explained that uncontrolled hypertension could potentially lead to dialysis.  Follow renal function closely. 6. Noncompliance-patient educated on the importance of compliance with medications. 7. History of left ventricular apical thrombus-resolved on follow-up echocardiograms.  Will repeat. 8. Elevated troponin-minimally elevated with no clear trend.  No chest pain.  No plans for further ischemia evaluation.   For questions or updates, please contact [facility] Please consult [email or web address] for contact info under Cardiology/STEMI.   Signed, Olga Millers, MD  11/13/2017 4:52 PM
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
## 2017-11-14 ENCOUNTER — Observation Stay (HOSPITAL_BASED_OUTPATIENT_CLINIC_OR_DEPARTMENT_OTHER): Payer: Medicaid Other

## 2017-11-14 DIAGNOSIS — I5030 Unspecified diastolic (congestive) heart failure: Secondary | ICD-10-CM | POA: Diagnosis not present

## 2017-11-14 DIAGNOSIS — F17200 Nicotine dependence, unspecified, uncomplicated: Secondary | ICD-10-CM

## 2017-11-14 DIAGNOSIS — I5042 Chronic combined systolic (congestive) and diastolic (congestive) heart failure: Secondary | ICD-10-CM | POA: Diagnosis not present

## 2017-11-14 DIAGNOSIS — I5043 Acute on chronic combined systolic (congestive) and diastolic (congestive) heart failure: Secondary | ICD-10-CM | POA: Diagnosis not present

## 2017-11-14 DIAGNOSIS — N182 Chronic kidney disease, stage 2 (mild): Secondary | ICD-10-CM | POA: Diagnosis not present

## 2017-11-14 DIAGNOSIS — I10 Essential (primary) hypertension: Secondary | ICD-10-CM | POA: Diagnosis not present

## 2017-11-14 DIAGNOSIS — E785 Hyperlipidemia, unspecified: Secondary | ICD-10-CM | POA: Diagnosis not present

## 2017-11-14 DIAGNOSIS — R748 Abnormal levels of other serum enzymes: Secondary | ICD-10-CM | POA: Diagnosis not present

## 2017-11-14 DIAGNOSIS — I428 Other cardiomyopathies: Secondary | ICD-10-CM | POA: Diagnosis not present

## 2017-11-14 LAB — BASIC METABOLIC PANEL
Anion gap: 10 (ref 5–15)
BUN: 16 mg/dL (ref 6–20)
CHLORIDE: 104 mmol/L (ref 98–111)
CO2: 23 mmol/L (ref 22–32)
Calcium: 8.7 mg/dL — ABNORMAL LOW (ref 8.9–10.3)
Creatinine, Ser: 1.67 mg/dL — ABNORMAL HIGH (ref 0.61–1.24)
GFR, EST NON AFRICAN AMERICAN: 53 mL/min — AB (ref >60)
Glucose, Bld: 94 mg/dL (ref 70–99)
POTASSIUM: 3.3 mmol/L — AB (ref 3.5–5.1)
Sodium: 137 mmol/L (ref 135–145)

## 2017-11-14 LAB — TROPONIN I
TROPONIN I: 0.12 ng/mL — AB (ref <0.03)
Troponin I: 0.1 ng/mL (ref <0.03)

## 2017-11-14 LAB — CBC
HEMATOCRIT: 48 % (ref 39.0–52.0)
Hemoglobin: 15.9 g/dL (ref 13.0–17.0)
MCH: 27.5 pg (ref 26.0–34.0)
MCHC: 33.1 g/dL (ref 30.0–36.0)
MCV: 82.9 fL (ref 78.0–100.0)
PLATELETS: 281 10*3/uL (ref 150–400)
RBC: 5.79 MIL/uL (ref 4.22–5.81)
RDW: 14.6 % (ref 11.5–15.5)
WBC: 6.3 10*3/uL (ref 4.0–10.5)

## 2017-11-14 LAB — ECHOCARDIOGRAM COMPLETE
Height: 71 in
WEIGHTICAEL: 4729.6 [oz_av]

## 2017-11-14 LAB — HIV ANTIBODY (ROUTINE TESTING W REFLEX): HIV Screen 4th Generation wRfx: NONREACTIVE

## 2017-11-14 MED ORDER — ASPIRIN 81 MG PO TBEC: 81.0000 mg | DELAYED_RELEASE_TABLET | Freq: Every day | ORAL | 0 refills | Status: DC

## 2017-11-14 MED ORDER — SIMVASTATIN 20 MG PO TABS: 20.0000 mg | ORAL_TABLET | Freq: Every day | ORAL | 0 refills | Status: DC

## 2017-11-14 MED ORDER — AMLODIPINE BESYLATE 10 MG PO TABS: 10.0000 mg | ORAL_TABLET | Freq: Every day | ORAL | 0 refills | Status: DC

## 2017-11-14 MED ORDER — CARVEDILOL 25 MG PO TABS: 25.0000 mg | ORAL_TABLET | Freq: Two times a day (BID) | ORAL | 0 refills | Status: DC

## 2017-11-14 MED ORDER — FUROSEMIDE 20 MG PO TABS: 20.0000 mg | ORAL_TABLET | Freq: Every day | ORAL | 0 refills | Status: DC

## 2017-11-14 MED ORDER — DIOVAN 320 MG PO TABS: 320.0000 mg | ORAL_TABLET | Freq: Every day | ORAL | 0 refills | Status: DC

## 2017-11-14 MED ORDER — IRBESARTAN 300 MG PO TABS
300.0000 mg | ORAL_TABLET | Freq: Every day | ORAL | Status: DC
Start: 2017-11-15 — End: 2017-11-14

## 2017-11-14 MED ORDER — POTASSIUM CHLORIDE CRYS ER 20 MEQ PO TBCR
40.0000 meq | EXTENDED_RELEASE_TABLET | Freq: Once | ORAL | Status: AC
  Administered 2017-11-14: 40 meq via ORAL
  Filled 2017-11-14: qty 2

## 2017-11-14 NOTE — Progress Notes (Signed)
Pt has been ambulating in the room and transferring to recliner chair without difficulty.  No distress noted, no c/o made. Meds tolerated well.

## 2017-11-14 NOTE — Progress Notes (Signed)
  Echocardiogram 2D Echocardiogram has been performed.  Shawn [name] 11/14/2017, 11:33 AM

## 2017-11-14 NOTE — Progress Notes (Signed)
Patient given discharge instructions and all questions answered.  

## 2017-11-14 NOTE — Progress Notes (Signed)
Pt  BP 160/102. Dr. Jarvis Newcomer notified. Patient states his meds are not dosed the same here as he takes them at home.

## 2017-11-14 NOTE — Discharge Summary (Signed)
Physician Discharge Summary  Shawn [name]:[id number] DOB: 06/18/1985 DOA: 11/13/2017  PCP: Shawn Phi, MD  Admit date: 11/13/2017 Discharge date: 11/14/2017  Admitted From: Home Disposition: Home   Recommendations for Outpatient Follow-up:  1. Follow up with PCP in 1-2 weeks.  2. Please obtain BMP/CBC in one week  Home Health: None Equipment/Devices: None Discharge Condition: Stable CODE STATUS: Full Diet recommendation: Heart healthy  Brief/Interim Summary: Shawn [name] is a 32 y.o. male with a history of chronic combined CHF, uncontrolled HTN, HLD, resolved LV mural thrombus no longer on coumadin who presented to the ED for medication refill. He reported running out of medications for a couple months and missing rescheduled appointment for refills with PCP and cardiology, Dr. Royann Shivers. He also reported some dyspnea and orthopnea. Evaluation showed elevated troponin without chest pain, worsening creatinine, and uncontrolled HTN. Cardiology was consulted and he was placed in observation. BP has improved dramatically with reinitiation of medications. Echocardiogram demonstrates a return to severe LV dysfunction (had initially improved to 40-45% EF with initiation of medications, but now down again to 20-25% with G2DD) but no thrombus. Symptoms improved dramatically and weight down with restart of lasix. Cardiology feels he's stable for discharge with orders to increase ARB and follow up more reliably.   Discharge Diagnoses:  Active Problems:   CKD (chronic kidney disease) stage 2, GFR 60-89 ml/min   Elevated troponin   Hyperlipidemia   Nonischemic cardiomyopathy (HCC)   Chronic combined systolic and diastolic CHF (congestive heart failure) (HCC)   Hypertension   Smoking  Dyspnea: Due to mild acute on chronic CHF and severe HTN. V/Q scan normal.  - Resolved  NICM, Acute on chronic combined HFrEF: EF had improved w/meds, now with nonadherence EF back down to 20-25%  and grade 2 diastolic dysfunction.  - Continue lasix 20mg , BB, ARB, may benefit from spironolactone but will defer to outpatient providers.  HTN with hypertensive urgency now resolved: - Improved, will restart home medications (30-day supply prescribed at discharge) with increased coreg. Will need follow up BMP and BP check.   History of LV thrombus: No longer on coumadin.  - Echo did not show evidence of thrombus  Elevated troponin: Not consistent with ACS.  - No ischemic evaluation planned per cardiology.  AKI (resolved) on stage II CKD: Told pt that this will worsen as well with uncontrolled risk factors possibly leading to dialysis. CrCl >60 not consistent with stage III CKD. - Continue monitor BMP and treating risk factors.  Tobacco use:  - Cessation counseling provided  History of CVA: LV thrombus resolved.  - Continue ASA, statin  Discharge Instructions Discharge Instructions    Diet - low sodium heart healthy   Complete by:  As directed    Discharge instructions   Complete by:  As directed    You were admitted due to severe high blood pressure which has caused worsening of your heart function. Fortunately the blood pressure has improved with medications which have been prescribed (printed) at discharge. You will need to take these as directed and follow up closely with either your cardiologist or your PCP sometime in this next 1-2 weeks. - Start taking aspirin as well as other medications as below - Stop smoking - If your symptoms return, seek medical attention.   Increase activity slowly   Complete by:  As directed      Allergies as of 11/14/2017      Reactions   Isosorbide Nitrate Other (See Comments)  headaches   Lisinopril Cough      Medication List    TAKE these medications   amLODipine 10 MG tablet Commonly known as:  NORVASC Take 1 tablet (10 mg total) by mouth daily. What changed:  Another medication with the same name was removed. Continue taking this  medication, and follow the directions you see here.   aspirin 81 MG EC tablet Take 1 tablet (81 mg total) by mouth daily. Start taking on:  11/15/2017   carvedilol 25 MG tablet Commonly known as:  COREG Take 1 tablet (25 mg total) by mouth 2 (two) times daily with a meal. What changed:    medication strength  how much to take   DIOVAN 320 MG tablet Generic drug:  valsartan Take 1 tablet (320 mg total) by mouth daily.   furosemide 20 MG tablet Commonly known as:  LASIX Take 1 tablet (20 mg total) by mouth daily. What changed:  medication strength   simvastatin 20 MG tablet Commonly known as:  ZOCOR Take 1 tablet (20 mg total) by mouth daily. Please contact our office for an appointment for continuation of medication refills      Follow-up Information    Shawn Phi, MD. Schedule an appointment as soon as possible for a visit.   Specialty:  Family Medicine Contact information: [address] [place] [address] [phone]        Croitoru, Rachelle Hora, MD. Schedule an appointment as soon as possible for a visit.   Specialty:  Cardiology Contact information: [address] [place] [address] [phone]          Allergies  Allergen Reactions  . Isosorbide Nitrate Other (See Comments)    headaches  . Lisinopril Cough    Consultations:  Cardiology  Procedures/Studies: Dg Chest 2 View  Result Date: 11/13/2017 CLINICAL DATA:  Worsening shortness of breath. EXAM: CHEST - 2 VIEW COMPARISON:  Chest x-ray dated Sep 18, 2014. FINDINGS: Stable cardiomegaly. Normal pulmonary vascularity. No focal consolidation, pleural effusion, or pneumothorax. No acute osseous abnormality. IMPRESSION: Stable cardiomegaly.  No active cardiopulmonary disease. Electronically Signed   By: Obie Dredge M.D.   On: 11/13/2017 10:03   Nm Pulmonary Perf And Vent  Result Date: 11/13/2017 CLINICAL DATA:  Shortness of breath. History of congestive heart failure  with low ejection fraction. EXAM: NUCLEAR MEDICINE VENTILATION - PERFUSION LUNG SCAN TECHNIQUE: Ventilation images were obtained in multiple projections using inhaled aerosol Tc-66m DTPA. Perfusion images were obtained in multiple projections after intravenous injection of Tc-32m-MAA. RADIOPHARMACEUTICALS:  31.0 mCi of Tc-80m DTPA aerosol inhalation and 4.2 mCi Tc31m-MAA IV COMPARISON:  Chest radiography same day FINDINGS: Ventilation: Normal Perfusion: Normal IMPRESSION: Normal VQ scan. Electronically Signed   By: Paulina Fusi M.D.   On: 11/13/2017 16:29   Echocardiogram 11/14/2017: - Left ventricle: The cavity size was severely dilated. Wall   thickness was increased in a pattern of moderate LVH. Systolic   function was severely reduced. The estimated ejection fraction   was in the range of 20% to 25%. Diffuse hypokinesis. Features are   consistent with a pseudonormal left ventricular filling pattern,   with concomitant abnormal relaxation and increased filling   pressure (grade 2 diastolic dysfunction). - Aortic root: The aortic root was mildly dilated. - Left atrium: The atrium was moderately dilated. - Pericardium, extracardiac: A trivial pericardial effusion was   identified.  Impressions: - Severe global reduction in LV systolic function; moderate LVH;   severe LVE; moderate diastolic dysfunction;  moderate LAE.  Subjective: Feels well. No chest pain, dyspnea or other complaints. Ambulating without dyspnea or chest pain. Eating ok. Wants to go home.   Discharge Exam: Vitals:   11/14/17 0700 11/14/17 1344  BP: (!) 153/100 (!) 153/88  Pulse: 90 78  Resp: 18 20  Temp: 98 F (36.7 C) (!) 97 F (36.1 C)  SpO2: 100% 100%   General: Pt is alert, awake, not in acute distress Cardiovascular: RRR, S1/S2 +, no rubs, no gallops Respiratory: CTA bilaterally, no wheezing, no rhonchi Abdominal: Soft, NT, ND, bowel sounds + Extremities: No edema, no cyanosis  Labs: BNP (last 3  results) Recent Labs    11/13/17 1342  BNP 180.6*   Basic Metabolic Panel: Recent Labs  Lab 11/13/17 0947 11/14/17 0504  NA 139 137  K 3.7 3.3*  CL 105 104  CO2 25 23  GLUCOSE 107* 94  BUN 22* 16  CREATININE 1.92* 1.67*  CALCIUM 8.9 8.7*   Liver Function Tests: No results for input(s): AST, ALT, ALKPHOS, BILITOT, PROT, ALBUMIN in the last 168 hours. No results for input(s): LIPASE, AMYLASE in the last 168 hours. No results for input(s): AMMONIA in the last 168 hours. CBC: Recent Labs  Lab 11/13/17 0947 11/14/17 0504  WBC 6.6 6.3  HGB 14.8 15.9  HCT 45.3 48.0  MCV 84.8 82.9  PLT 265 281   Cardiac Enzymes: Recent Labs  Lab 11/13/17 1342 11/13/17 1923 11/13/17 2332 11/14/17 0504  TROPONINI 0.10* 0.09* 0.10* 0.12*   BNP: Invalid input(s): POCBNP CBG: No results for input(s): GLUCAP in the last 168 hours. D-Dimer Recent Labs    11/13/17 1342  DDIMER 0.76*    Time coordinating discharge: Approximately 40 minutes  Tyrone Nine, MD  [facility] 11/14/2017, 4:20 PM Pager [phone]

## 2017-11-14 NOTE — Progress Notes (Addendum)
Progress Note  Patient Name: Shawn [name] Date of Encounter: 11/14/2017  Primary Cardiologist: Dr Royann Shivers  Subjective   No chest pain or dyspnea  Inpatient Medications    Scheduled Meds: . amLODipine  10 mg Oral Daily  . aspirin EC  81 mg Oral Daily  . carvedilol  12.5 mg Oral BID WC  . furosemide  20 mg Oral Daily  . heparin  5,000 Units Subcutaneous Q8H  . irbesartan  150 mg Oral Daily  . simvastatin  20 mg Oral Daily   Continuous Infusions:  PRN Meds: hydrALAZINE   Vital Signs    Vitals:   11/13/17 1939 11/14/17 0047 11/14/17 0652 11/14/17 0700  BP: (!) 141/88 (!) 156/93 (!) 160/106 (!) 153/100  Pulse: 97 92 88 90  Resp: 18 18 18 18   Temp: 98.4 F (36.9 C) 98.3 F (36.8 C) 98.1 F (36.7 C) 98 F (36.7 C)  TempSrc: Oral Oral Oral Oral  SpO2: 98% 100% 100% 100%  Weight:   295 lb 9.6 oz (134.1 kg)   Height:        Intake/Output Summary (Last 24 hours) at 11/14/2017 1106 Last data filed at 11/14/2017 0900 Gross per 24 hour  Intake 480 ml  Output 1200 ml  Net -720 ml   Filed Weights   11/13/17 1641 11/14/17 0652  Weight: 300 lb (136.1 kg) 295 lb 9.6 oz (134.1 kg)    Telemetry    Sinus- Personally Reviewed  Physical Exam   GEN: No acute distress.   Neck: No JVD Cardiac: RRR, no murmurs, rubs, or gallops.  Respiratory: Clear to auscultation bilaterally. GI: Soft, nontender, non-distended  MS: No edema Neuro:  Nonfocal  Psych: Normal affect   Labs    Chemistry Recent Labs  Lab 11/13/17 0947 11/14/17 0504  NA 139 137  K 3.7 3.3*  CL 105 104  CO2 25 23  GLUCOSE 107* 94  BUN 22* 16  CREATININE 1.92* 1.67*  CALCIUM 8.9 8.7*  GFRNONAA 45* 53*  GFRAA 52* >60  ANIONGAP 9 10     Hematology Recent Labs  Lab 11/13/17 0947 11/14/17 0504  WBC 6.6 6.3  RBC 5.34 5.79  HGB 14.8 15.9  HCT 45.3 48.0  MCV 84.8 82.9  MCH 27.7 27.5  MCHC 32.7 33.1  RDW 14.8 14.6  PLT 265 281    Cardiac Enzymes Recent Labs  Lab 11/13/17 1342  11/13/17 1923 11/13/17 2332 11/14/17 0504  TROPONINI 0.10* 0.09* 0.10* 0.12*    Recent Labs  Lab 11/13/17 0953  TROPIPOC 0.13*     BNP Recent Labs  Lab 11/13/17 1342  BNP 180.6*     DDimer  Recent Labs  Lab 11/13/17 1342  DDIMER 0.76*     Radiology    Dg Chest 2 View  Result Date: 11/13/2017 CLINICAL DATA:  Worsening shortness of breath. EXAM: CHEST - 2 VIEW COMPARISON:  Chest x-ray dated Sep 18, 2014. FINDINGS: Stable cardiomegaly. Normal pulmonary vascularity. No focal consolidation, pleural effusion, or pneumothorax. No acute osseous abnormality. IMPRESSION: Stable cardiomegaly.  No active cardiopulmonary disease. Electronically Signed   By: Obie Dredge M.D.   On: 11/13/2017 10:03   Nm Pulmonary Perf And Vent  Result Date: 11/13/2017 CLINICAL DATA:  Shortness of breath. History of congestive heart failure with low ejection fraction. EXAM: NUCLEAR MEDICINE VENTILATION - PERFUSION LUNG SCAN TECHNIQUE: Ventilation images were obtained in multiple projections using inhaled aerosol Tc-73m DTPA. Perfusion images were obtained in multiple projections after intravenous injection of  Tc-78m-MAA. RADIOPHARMACEUTICALS:  31.0 mCi of Tc-54m DTPA aerosol inhalation and 4.2 mCi Tc28m-MAA IV COMPARISON:  Chest radiography same day FINDINGS: Ventilation: Normal Perfusion: Normal IMPRESSION: Normal VQ scan. Electronically Signed   By: Paulina Fusi M.D.   On: 11/13/2017 16:29    Patient Profile     32 y.o. 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 Patient was initially diagnosed with congestive heart failure in May 2016 with ejection fraction of around 25%.  In July 2016 he had an embolic CVA and echocardiogram showed a left ventricular apical thrombus.  He was treated for malignant hypertension  which was felt to be the etiology of his cardiomyopathy.  He was also treated with Coumadin. Last echocardiogram November 2017 showed ejection fraction 40 to 45% with moderate diastolic dysfunction and trace aortic insufficiency.  Patient ran out of his medications 2 months ago by report. Admitted with hypertension and CHF.  Assessment & Plan    1. Acute on chronic combined systolic/diastolic congestive heart failure-patient's dyspnea improved today.  Continue Lasix 20 mg daily.  Continue fluid restriction and low-sodium diet.   2. Nonischemic cardiomyopathy-patient has a history of cardiomyopathy felt secondary to malignant hypertension.  He was not taking his medications on admission as he "ran out".  Continue carvedilol.  Increase Avapro to 300 mg daily.  Await echocardiogram.  Given uncontrolled hypertension and noncompliance I expect his LV function to be decreased again. 3. Hypertension-blood pressure improved but remains elevated.  Increase Avapro to 300 mg daily and follow. 4. Tobacco abuse -patient counseled on discontinuing. 5. Chronic stage III kidney disease-recheck creatinine tomorrow morning.  Stable today. 6. Noncompliance-patient educated on the importance of compliance with medications. 7. History of left ventricular apical thrombus-resolved on follow-up echocardiograms.    Will await follow-up study. 8. Elevated troponin-minimally elevated with no clear trend.  No chest pain.  No plans for further ischemia evaluation. 9. Hypokalemia-supplement  If no thrombus on echocardiogram patient can likely be discharged later today or tomorrow morning with close follow-up in the office.  Would check potassium and renal function 1 week after discharge.  For questions or updates, please contact [facility] Please consult [email or web address] for contact info under Cardiology/STEMI.      Signed, Olga Millers, MD  11/14/2017, 11:06 AM

## 2017-11-16 ENCOUNTER — Telehealth: Payer: Self-pay | Admitting: Cardiovascular Disease

## 2017-11-16 NOTE — Telephone Encounter (Signed)
New Message:      Pt has a TOC appointment on 11/24/17 at 9:00 with Barrett per Sue Lush

## 2017-11-16 NOTE — Telephone Encounter (Signed)
Left message for pt to call.

## 2017-11-17 NOTE — Telephone Encounter (Signed)
Patient contacted regarding discharge from [facility] on 11/14/17.  Patient understands to follow up with provider Bjorn Loser, PA on 11/24/17 at 0900 at [facility]. Patient understands discharge instructions? YES Patient understands medications and regiment? YES Patient understands to bring all medications to this visit? YEs

## 2017-11-24 ENCOUNTER — Encounter: Payer: Self-pay | Admitting: Physician Assistant

## 2017-11-24 ENCOUNTER — Ambulatory Visit (INDEPENDENT_AMBULATORY_CARE_PROVIDER_SITE_OTHER): Payer: Medicaid Other | Admitting: Physician Assistant

## 2017-11-24 VITALS — BP 138/84 | HR 82 | Ht 71.0 in | Wt 310.0 lb

## 2017-11-24 DIAGNOSIS — I10 Essential (primary) hypertension: Secondary | ICD-10-CM | POA: Diagnosis not present

## 2017-11-24 DIAGNOSIS — I5042 Chronic combined systolic (congestive) and diastolic (congestive) heart failure: Secondary | ICD-10-CM | POA: Diagnosis not present

## 2017-11-24 DIAGNOSIS — N289 Disorder of kidney and ureter, unspecified: Secondary | ICD-10-CM | POA: Diagnosis not present

## 2017-11-24 DIAGNOSIS — I5043 Acute on chronic combined systolic (congestive) and diastolic (congestive) heart failure: Secondary | ICD-10-CM

## 2017-11-24 LAB — BASIC METABOLIC PANEL
BUN/Creatinine Ratio: 14 (ref 9–20)
BUN: 25 mg/dL — ABNORMAL HIGH (ref 6–20)
CALCIUM: 9.3 mg/dL (ref 8.7–10.2)
CHLORIDE: 105 mmol/L (ref 96–106)
CO2: 21 mmol/L (ref 20–29)
Creatinine, Ser: 1.85 mg/dL — ABNORMAL HIGH (ref 0.76–1.27)
GFR calc non Af Amer: 47 mL/min/{1.73_m2} — ABNORMAL LOW (ref >59)
GFR, EST AFRICAN AMERICAN: 54 mL/min/{1.73_m2} — AB (ref >59)
Glucose: 88 mg/dL (ref 65–99)
POTASSIUM: 4.7 mmol/L (ref 3.5–5.2)
SODIUM: 140 mmol/L (ref 134–144)

## 2017-11-24 MED ORDER — SIMVASTATIN 20 MG PO TABS
20.0000 mg | ORAL_TABLET | Freq: Every day | ORAL | 3 refills | Status: DC
Start: 2017-11-24 — End: 2019-05-31

## 2017-11-24 MED ORDER — SIMVASTATIN 20 MG PO TABS: 20.0000 mg | ORAL_TABLET | Freq: Every day | ORAL | 3 refills | Status: DC

## 2017-11-24 MED ORDER — DIOVAN 320 MG PO TABS: 320.0000 mg | ORAL_TABLET | Freq: Every day | ORAL | 3 refills | Status: DC

## 2017-11-24 MED ORDER — POTASSIUM CHLORIDE ER 10 MEQ PO TBCR: 10.0000 meq | EXTENDED_RELEASE_TABLET | Freq: Every day | ORAL | 0 refills | Status: DC

## 2017-11-24 MED ORDER — CARVEDILOL 25 MG PO TABS: 25.0000 mg | ORAL_TABLET | Freq: Two times a day (BID) | ORAL | 3 refills | Status: DC

## 2017-11-24 MED ORDER — FUROSEMIDE 20 MG PO TABS: 20.0000 mg | ORAL_TABLET | Freq: Every day | ORAL | 3 refills | Status: DC

## 2017-11-24 MED ORDER — HYDRALAZINE HCL 25 MG PO TABS: 25.0000 mg | ORAL_TABLET | Freq: Two times a day (BID) | ORAL | 3 refills | Status: DC

## 2017-11-24 MED ORDER — AMLODIPINE BESYLATE 10 MG PO TABS: 5.0000 mg | ORAL_TABLET | Freq: Every day | ORAL | 3 refills | Status: DC

## 2017-11-24 MED ORDER — FUROSEMIDE 20 MG PO TABS: 20.0000 mg | ORAL_TABLET | Freq: Every day | ORAL | 0 refills | Status: DC

## 2017-11-24 MED FILL — hydrALAZINE HCL 25 MG TABS: 25 | 30 days supply | Qty: 60 | Fill #0

## 2017-11-24 MED FILL — AMLODIPINE BESYLATE 10 MG T: 10 | 30 days supply | Qty: 15 | Fill #0

## 2017-11-24 MED FILL — POTASSIUM CL 10 MEQ TAB SA: 10 | 30 days supply | Qty: 30 | Fill #0
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
## 2017-11-24 NOTE — Progress Notes (Signed)
Ideally, we will completely replace the amlodipine with hydralazine/nitrates in higher doses. Thanks [employer]

## 2017-11-24 NOTE — Patient Instructions (Addendum)
Medication Instructions: Your physician has recommended you make the following change in your medication:  DECREASE:  Amlodipine to just half a tablet daily (5 mg)  INCREASE: Take a extra dose of your lasix for 3 days, After that you may return to your normal dose.   START: Kdur 10 meq as directed   START: Hydralazine 25 mg twice a day   Labwork: TODAY: BMET  Procedures/Testing: None Ordered  Follow-Up: Your physician recommends that you schedule a follow-up appointment in: 1 month with Theodore Demark or Pharmacy for Medication Titration    Please go to our CHF clinic today to pick up a set of scales. Go in through the emergency room and turn left as you leave. [phone]  Any Additional Special Instructions Will Be Listed Below (If Applicable).   Please try to intake less than 2 liters of fluid a day    Low-Sodium Eating Plan Sodium, which is an element that makes up salt, helps you maintain a healthy balance of fluids in your body. Too much sodium can increase your blood pressure and cause fluid and waste to be held in your body. Your health care provider or dietitian may recommend following this plan if you have high blood pressure (hypertension), kidney disease, liver disease, or heart failure. Eating less sodium can help lower your blood pressure, reduce swelling, and protect your heart, liver, and kidneys. What are tips for following this plan? General guidelines  Most people on this plan should limit their sodium intake to 1,500-2,000 mg (milligrams) of sodium each day. (500 mg per meal)  Reading food labels  The Nutrition Facts label lists the amount of sodium in one serving of the food. If you eat more than one serving, you must multiply the listed amount of sodium by the number of servings.  Choose foods with less than 140 mg of sodium per serving.  Avoid foods with 300 mg of sodium or more per serving. Shopping  Look for lower-sodium products, often labeled as  "low-sodium" or "no salt added."  Always check the sodium content even if foods are labeled as "unsalted" or "no salt added".  Buy fresh foods. ? Avoid canned foods and premade or frozen meals. ? Avoid canned, cured, or processed meats  Buy breads that have less than 80 mg of sodium per slice. Cooking  Eat more home-cooked food and less restaurant, buffet, and fast food.  Avoid adding salt when cooking. Use salt-free seasonings or herbs instead of table salt or sea salt. Check with your health care provider or pharmacist before using salt substitutes.  Cook with plant-based oils, such as canola, sunflower, or olive oil. Meal planning  When eating at a restaurant, ask that your food be prepared with less salt or no salt, if possible.  Avoid foods that contain MSG (monosodium glutamate). MSG is sometimes added to [place] food, bouillon, and some canned foods. What foods are recommended? The items listed may not be a complete list. Talk with your dietitian about what dietary choices are best for you. Grains Low-sodium cereals, including oats, puffed wheat and rice, and shredded wheat. Low-sodium crackers. Unsalted rice. Unsalted pasta. Low-sodium bread. Whole-grain breads and whole-grain pasta. Vegetables Fresh or frozen vegetables. "No salt added" canned vegetables. "No salt added" tomato sauce and paste. Low-sodium or reduced-sodium tomato and vegetable juice. Fruits Fresh, frozen, or canned fruit. Fruit juice. Meats and other protein foods Fresh or frozen (no salt added) meat, poultry, seafood, and fish. Low-sodium canned tuna and salmon. Unsalted  nuts. Dried peas, beans, and lentils without added salt. Unsalted canned beans. Eggs. Unsalted nut butters. Dairy Milk. Soy milk. Cheese that is naturally low in sodium, such as ricotta cheese, fresh mozzarella, or Swiss cheese Low-sodium or reduced-sodium cheese. Cream cheese. Yogurt. Fats and oils Unsalted butter. Unsalted margarine with  no trans fat. Vegetable oils such as canola or olive oils. Seasonings and other foods Fresh and dried herbs and spices. Salt-free seasonings. Low-sodium mustard and ketchup. Sodium-free salad dressing. Sodium-free light mayonnaise. Fresh or refrigerated horseradish. Lemon juice. Vinegar. Homemade, reduced-sodium, or low-sodium soups. Unsalted popcorn and pretzels. Low-salt or salt-free chips. What foods are not recommended? The items listed may not be a complete list. Talk with your dietitian about what dietary choices are best for you. Grains Instant hot cereals. Bread stuffing, pancake, and biscuit mixes. Croutons. Seasoned rice or pasta mixes. Noodle soup cups. Boxed or frozen macaroni and cheese. Regular salted crackers. Self-rising flour. Vegetables Sauerkraut, pickled vegetables, and relishes. Olives. [place] fries. Onion rings. Regular canned vegetables (not low-sodium or reduced-sodium). Regular canned tomato sauce and paste (not low-sodium or reduced-sodium). Regular tomato and vegetable juice (not low-sodium or reduced-sodium). Frozen vegetables in sauces. Meats and other protein foods Meat or fish that is salted, canned, smoked, spiced, or pickled. Bacon, ham, sausage, hotdogs, corned beef, chipped beef, packaged lunch meats, salt pork, jerky, pickled herring, anchovies, regular canned tuna, sardines, salted nuts. Dairy Processed cheese and cheese spreads. Cheese curds. Blue cheese. Feta cheese. String cheese. Regular cottage cheese. Buttermilk. Canned milk. Fats and oils Salted butter. Regular margarine. Ghee. Bacon fat. Seasonings and other foods Onion salt, garlic salt, seasoned salt, table salt, and sea salt. Canned and packaged gravies. Worcestershire sauce. Tartar sauce. Barbecue sauce. Teriyaki sauce. Soy sauce, including reduced-sodium. Steak sauce. Fish sauce. Oyster sauce. Cocktail sauce. Horseradish that you find on the shelf. Regular ketchup and mustard. Meat flavorings and  tenderizers. Bouillon cubes. Hot sauce and Tabasco sauce. Premade or packaged marinades. Premade or packaged taco seasonings. Relishes. Regular salad dressings. Salsa. Potato and tortilla chips. Corn chips and puffs. Salted popcorn and pretzels. Canned or dried soups. Pizza. Frozen entrees and pot pies. Summary  Eating less sodium can help lower your blood pressure, reduce swelling, and protect your heart, liver, and kidneys.  Most people on this plan should limit their sodium intake to 1,500-2,000 mg (milligrams) of sodium each day.  Canned, boxed, and frozen foods are high in sodium. Restaurant foods, fast foods, and pizza are also very high in sodium. You also get sodium by adding salt to food.  Try to cook at home, eat more fresh fruits and vegetables, and eat less fast food, canned, processed, or prepared foods. This information is not intended to replace advice given to you by your health care provider. Make sure you discuss any questions you have with your health care provider. Document Released: 10/25/2001 Document Revised: 04/28/2016 Document Reviewed: 04/28/2016 Elsevier Interactive Patient Education  [employer].    If you need a refill on your cardiac medications before your next appointment, please call your pharmacy.

## 2017-11-27 ENCOUNTER — Other Ambulatory Visit: Payer: Self-pay

## 2017-11-27 DIAGNOSIS — I5042 Chronic combined systolic (congestive) and diastolic (congestive) heart failure: Secondary | ICD-10-CM

## 2017-12-01 ENCOUNTER — Ambulatory Visit: Payer: Medicaid Other | Attending: Family Medicine | Admitting: Physician Assistant

## 2017-12-01 VITALS — BP 143/83 | HR 81 | Temp 98.6°F | Resp 18 | Ht 72.0 in | Wt 314.0 lb

## 2017-12-01 DIAGNOSIS — I130 Hypertensive heart and chronic kidney disease with heart failure and stage 1 through stage 4 chronic kidney disease, or unspecified chronic kidney disease: Secondary | ICD-10-CM | POA: Diagnosis not present

## 2017-12-01 DIAGNOSIS — F1290 Cannabis use, unspecified, uncomplicated: Secondary | ICD-10-CM | POA: Diagnosis not present

## 2017-12-01 DIAGNOSIS — Z86718 Personal history of other venous thrombosis and embolism: Secondary | ICD-10-CM | POA: Insufficient documentation

## 2017-12-01 DIAGNOSIS — I429 Cardiomyopathy, unspecified: Secondary | ICD-10-CM | POA: Diagnosis not present

## 2017-12-01 DIAGNOSIS — I420 Dilated cardiomyopathy: Secondary | ICD-10-CM | POA: Diagnosis not present

## 2017-12-01 DIAGNOSIS — I5043 Acute on chronic combined systolic (congestive) and diastolic (congestive) heart failure: Secondary | ICD-10-CM | POA: Diagnosis not present

## 2017-12-01 DIAGNOSIS — Z7982 Long term (current) use of aspirin: Secondary | ICD-10-CM | POA: Diagnosis not present

## 2017-12-01 DIAGNOSIS — I10 Essential (primary) hypertension: Secondary | ICD-10-CM | POA: Diagnosis not present

## 2017-12-01 DIAGNOSIS — N182 Chronic kidney disease, stage 2 (mild): Secondary | ICD-10-CM

## 2017-12-01 DIAGNOSIS — E785 Hyperlipidemia, unspecified: Secondary | ICD-10-CM | POA: Diagnosis not present

## 2017-12-01 DIAGNOSIS — Z79899 Other long term (current) drug therapy: Secondary | ICD-10-CM | POA: Insufficient documentation

## 2017-12-01 NOTE — Patient Instructions (Addendum)
Cut back on salt, weed and alcohol Take ALL meds every day Keep cardiology appt in Aug!! u have refills on all of your meds already

## 2017-12-01 NOTE — Progress Notes (Signed)
Chief Complaint: Hospital follow up  Subjective: This is a 32 year old male with known combined congestive heart failure, cardiomyopathy with recovered EF now low again, hyperlipidemia, LV thrombus with completed anticoagulation, chronic kidney disease stage II and hypertension who is here for hospital follow-up.  He was hospitalized 11/13/2017 through 11/14/2017 with dyspnea and orthopnea.  His creatinine on presentation was 1.9.  His troponin was positive at 0.13.  His EKG was nonrevealing.  He was admitted by the hospitalist.  He had been out of his medications for at least 2 months.  He was hypertensive on presentation.  Cardiology did get involved.  An echo was repeated and it shows his EF to be low again at 20 to 25%.  He has 2 out of 4 diastolic dysfunction but no evidence of thrombus this time.  A VQ scan showed low probability for pulmonary emboli.  His medications were restarted and uptitrated and his creatinine at discharge is 1.8.  He has been taking all of his medications as prescribed.  No chest pain.  No shortness of breath.  No dyspnea.  No swelling in his legs.  He is not employed.  He does smoke weed but no cigarettes.  Occasionally drinks alcohol.  The significance or magnitude of his cardiomyopathy.   ROS:  GEN: denies fever or chills, denies change in weight Skin: denies lesions or rashes HEENT: denies headache, earache, epistaxis, sore throat, or neck pain LUNGS: denies SHOB, dyspnea, PND, orthopnea CV: denies CP or palpitations ABD: denies abd pain, N or V EXT: denies muscle spasms or swelling; no pain in lower ext, no weakness NEURO: denies numbness or tingling, denies sz, stroke or TIA   Objective:  Vitals:   12/01/17 0950  BP: (!) 143/83  Pulse: 81  Resp: 18  Temp: 98.6 F (37 C)  TempSrc: Oral  SpO2: 97%  Weight: (!) 314 lb (142.4 kg)  Height: 6' (1.829 m)    Physical Exam:  General: in no acute distress. HEENT: no pallor, no icterus, moist oral mucosa,  NO JVD, no lymphadenopathy Heart: Normal  s1 &s2  Regular rate and rhythm, without murmurs, rubs, gallops. Lungs: Clear to auscultation bilaterally. Extremities: No clubbing cyanosis or edema with positive pedal pulses. Neuro: Alert, awake, oriented x3, nonfocal.   Medications: Prior to Admission medications   Medication Sig Start Date End Date Taking? Authorizing Provider  amLODipine (NORVASC) 10 MG tablet Take 0.5 tablets (5 mg total) by mouth daily. 11/24/17  Yes Barrett, Joline Salt, PA-C  aspirin EC 81 MG EC tablet Take 1 tablet (81 mg total) by mouth daily. 11/15/17  Yes Tyrone Nine, MD  carvedilol (COREG) 25 MG tablet Take 1 tablet (25 mg total) by mouth 2 (two) times daily with a meal. 11/24/17  Yes Barrett, Rhonda G, PA-C  DIOVAN 320 MG tablet Take 1 tablet (320 mg total) by mouth daily. 11/24/17  Yes Barrett, Joline Salt, PA-C  furosemide (LASIX) 20 MG tablet Take 1 tablet (20 mg total) by mouth daily. 11/24/17  Yes Barrett, Joline Salt, PA-C  hydrALAZINE (APRESOLINE) 25 MG tablet Take 1 tablet (25 mg total) by mouth 2 (two) times daily. 11/24/17 02/22/18 Yes Barrett, Joline Salt, PA-C  potassium chloride (K-DUR) 10 MEQ tablet Take 1 tablet (10 mEq total) by mouth daily. 11/24/17 02/22/18 Yes Barrett, Joline Salt, PA-C  simvastatin (ZOCOR) 20 MG tablet Take 1 tablet (20 mg total) by mouth daily. 11/24/17  Yes Barrett, Joline Salt, PA-C    Assessment: 1. Acute on Chronic  Combined CHF 2. Cardiomyopathy EF 20-25% 3. Acute on CKD stage 2-3 4. HTN 5. Hyperlipidemia  Plan: Salt and fluid restriction Guideline directed medical therapy for CHF/CM Repeat echo in 3 mo Keep appt with [facility], they are uptitrating meds Has refills Check BMP  Follow up:4 weeks  The patient was given clear instructions to go to ER or return to medical center if symptoms don't improve, worsen or new problems develop. The patient verbalized understanding. The patient was told to call to get lab results if they haven't heard anything in  the next week.   This note has been created with Education officer, environmental. Any transcriptional errors are unintentional.   Scot Jun, PA-C 12/01/2017, 10:06 AM

## 2017-12-02 LAB — BASIC METABOLIC PANEL
BUN/Creatinine Ratio: 14 (ref 9–20)
BUN: 25 mg/dL — ABNORMAL HIGH (ref 6–20)
CHLORIDE: 105 mmol/L (ref 96–106)
CO2: 21 mmol/L (ref 20–29)
Calcium: 9.2 mg/dL (ref 8.7–10.2)
Creatinine, Ser: 1.77 mg/dL — ABNORMAL HIGH (ref 0.76–1.27)
GFR calc non Af Amer: 50 mL/min/{1.73_m2} — ABNORMAL LOW (ref >59)
GFR, EST AFRICAN AMERICAN: 57 mL/min/{1.73_m2} — AB (ref >59)
Glucose: 90 mg/dL (ref 65–99)
POTASSIUM: 4.5 mmol/L (ref 3.5–5.2)
Sodium: 142 mmol/L (ref 134–144)

## 2017-12-04 ENCOUNTER — Telehealth: Payer: Self-pay | Admitting: *Deleted

## 2017-12-04 NOTE — Telephone Encounter (Signed)
Pt name and DOB verified. Pt informed of lab results and result note. Pt aware of appointment in 8/19. Encourage to stay hydrated. Instructed to ask provider about cholesterol test at appointment. He was instructed to fast 6-8 hours prior to appointment incase he wanted lipid panel. Pt verbalized understanding.

## 2017-12-17 MED FILL — CARVEDILOL 25 MG TABLET: 25 | 30 days supply | Qty: 60 | Fill #0

## 2017-12-17 MED FILL — AMLODIPINE BESYLATE 10 MG T: 10 | 30 days supply | Qty: 15 | Fill #1

## 2017-12-17 MED FILL — hydrALAZINE HCL 25 MG TABS: 25 | 30 days supply | Qty: 60 | Fill #1

## 2017-12-17 MED FILL — SIMVASTATIN 20 MG TABLET: 20 | 30 days supply | Qty: 30 | Fill #0

## 2017-12-17 MED FILL — FUROSEMIDE 20 MG TABLET: 20 | 30 days supply | Qty: 30 | Fill #0

## 2017-12-17 MED FILL — VALSARTAN 320 MG TAB: 320 | 30 days supply | Qty: 30 | Fill #0

## 2017-12-25 ENCOUNTER — Ambulatory Visit (INDEPENDENT_AMBULATORY_CARE_PROVIDER_SITE_OTHER): Payer: Medicaid Other | Admitting: Pharmacist Clinician (PhC)/ Clinical Pharmacy Specialist

## 2017-12-25 ENCOUNTER — Encounter: Payer: Self-pay | Admitting: Pharmacist Clinician (PhC)/ Clinical Pharmacy Specialist

## 2017-12-25 DIAGNOSIS — I5042 Chronic combined systolic (congestive) and diastolic (congestive) heart failure: Secondary | ICD-10-CM

## 2017-12-25 DIAGNOSIS — I10 Essential (primary) hypertension: Secondary | ICD-10-CM | POA: Diagnosis not present

## 2017-12-25 NOTE — Progress Notes (Signed)
12/25/2017 Shawn [name] 1985-09-09 [id number]   HPI:  Shawn [name] is a 32 y.o. male patient of Dr Royann Shivers, with a PMH below who presents today for heart failure medication titration and hypertension.  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    Is most recent echo showed an LVEF of 20-25%, down from 40-45%.  Of note, his EF was previously about 20% back in Aug 2016, but improved after several months.     Patient reports mostly compliant with his medications.  His bigger problem right now appears to be lack of housing.  He mentioned that he stays in "different places" most nights, and has been trying to work with his case manager to get some housing assistance.   He knows he needs to lose weight, eat better and increase his exercise, but is finding it difficult when he can't control his meals.  He also has a 42 year old son and has shared custody.    Blood Pressure Goal:  130/80  Current Medications:  Carvedilol 25 mg bid  Valsartan 320 mg qd - am  Furosemide 20 mg qd  Family Hx:  Mother has DM, hypertension,   2 brothers - one on dialysis, one with heart disease  1 sister healthy  Social Hx:  Smokes 2-3 cigarettes, occasional marijuana; no more than 2 beers per week; 1-2 cans of soda per week  Diet:  Living in various places, eating whatever is put out.       Exercise:  Goes to gym walks the track 40-60 min, able to go 2-3 times per week  Home BP readings:  No home meter  Intolerances:   Lisinopril caused cough  Labs:  12/01/17:  Na 142, K 4.5, Glu 90, BUN 25, SCr 1.77  CrCl  11/24/17:  Na 140, K 4.7, Glu 88, BUN 25, SCr 1.85   Wt Readings from Last 3 Encounters:  12/25/17 (!) 311 lb 6.4 oz (141.3 kg)  12/01/17 (!) 314 lb (142.4 kg)  11/24/17 (!) 310 lb (140.6 kg)   BP Readings from Last 3 Encounters:  12/25/17 (!) 148/100  12/01/17 (!) 143/83  11/24/17 138/84   Pulse  Readings from Last 3 Encounters:  12/25/17 72  12/01/17 81  11/24/17 82    Current Outpatient Medications  Medication Sig Dispense Refill  . amLODipine (NORVASC) 10 MG tablet Take 0.5 tablets (5 mg total) by mouth daily. 90 tablet 3  . aspirin EC 81 MG EC tablet Take 1 tablet (81 mg total) by mouth daily. 30 tablet 0  . carvedilol (COREG) 25 MG tablet Take 1 tablet (25 mg total) by mouth 2 (two) times daily with a meal. 180 tablet 3  . DIOVAN 320 MG tablet Take 1 tablet (320 mg total) by mouth daily. 90 tablet 3  . furosemide (LASIX) 20 MG tablet Take 1 tablet (20 mg total) by mouth daily. 90 tablet 3  . hydrALAZINE (APRESOLINE) 25 MG tablet Take 1 tablet (25 mg total) by mouth 2 (two) times daily. 180 tablet 3  . potassium chloride (K-DUR) 10 MEQ tablet Take 1 tablet (10 mEq total) by mouth daily. (Patient not taking: Reported on 12/25/2017) 60 tablet 0  . simvastatin (ZOCOR) 20 MG tablet Take 1 tablet (20 mg total) by mouth daily. 90 tablet 3   No current facility-administered medications for this visit.     Allergies  Allergen Reactions  . Isosorbide  Nitrate Other (See Comments)    headaches  . Lisinopril Cough    Past Medical History:  Diagnosis Date  . At risk for sleep apnea    STOP-BANG= 4      SENT TO PCP 11-10-2014  . CKD (chronic kidney disease), stage III (HCC)   . Dyspnea on exertion   . Hyperlipidemia   . Hypertension    at age 89  . Malignant hypertension    dx age 63  . Nonischemic cardiomyopathy (HCC)   . Penile adhesions   . Systolic and diastolic CHF, chronic (HCC) dx 09-18-2014   cardiologist-  dr Rachelle Hora croitoru----  ef 25-30% per last note 11-03-2014    Blood pressure (!) 148/100, pulse 72, weight (!) 311 lb 6.4 oz (141.3 kg).  Chronic combined systolic and diastolic CHF (congestive heart failure) (HCC) Patient with CHF (EF 20-25%) currently on carvedilol, valsartan and furosemide.  His weight is up some today.  Will start prior authorization forms to  see if we can get Entresto covered for this patient.  In the meantime he is to continue with his current medications.  He was encouraged to talk with his case worker about getting stable housing.  He was also encouraged to quit smoking and using marijuana.  He understands the dangers associated with these, and is trying to cut back.  It was also stressed that he should exercise most days of the week and monitor his sodium as much as he is able.  We will see him back in 1 month for follow up, hopefully will have Entresto approved before then.  (Once approved will call patient to make sure he understands to stop valsartan)    Hypertension Patient with malignant hypertension, not at control today.  Will have him increase amlodipine back to 10 mg daily.  He is to continue all other medications.     Phillips Hay PharmD CPP [facility] [address] [place], [place] [address] [phone]

## 2017-12-25 NOTE — Assessment & Plan Note (Signed)
Patient with malignant hypertension, not at control today.  Will have him increase amlodipine back to 10 mg daily.  He is to continue all other medications.

## 2017-12-25 NOTE — Assessment & Plan Note (Signed)
Patient with CHF (EF 20-25%) currently on carvedilol, valsartan and furosemide.  His weight is up some today.  Will start prior authorization forms to see if we can get Entresto covered for this patient.  In the meantime he is to continue with his current medications.  He was encouraged to talk with his case worker about getting stable housing.  He was also encouraged to quit smoking and using marijuana.  He understands the dangers associated with these, and is trying to cut back.  It was also stressed that he should exercise most days of the week and monitor his sodium as much as he is able.  We will see him back in 1 month for follow up, hopefully will have Entresto approved before then.  (Once approved will call patient to make sure he understands to stop valsartan)

## 2017-12-25 NOTE — Patient Instructions (Signed)
Return for a a follow up appointment 1 month  Your blood pressure today is 148/100  Take your BP meds as follows:  Increase the amlodipine back to 1 tablet daily  Continue all other medications  Once we get approval from your insurance for [place], we will give you a call.    Bring all of your meds, your BP cuff and your record of home blood pressures to your next appointment.  Exercise as you're able, try to walk approximately 30 minutes per day.  Keep salt intake to a minimum, especially watch canned and prepared boxed foods.  Eat more fresh fruits and vegetables and fewer canned items.  Avoid eating in fast food restaurants.

## 2018-01-04 ENCOUNTER — Ambulatory Visit: Payer: Medicaid Other | Attending: Nurse Practitioner | Admitting: Nurse Practitioner

## 2018-01-04 ENCOUNTER — Encounter: Payer: Self-pay | Admitting: Nurse Practitioner

## 2018-01-04 VITALS — BP 127/83 | HR 82 | Temp 98.5°F | Ht 72.0 in | Wt 310.0 lb

## 2018-01-04 DIAGNOSIS — I130 Hypertensive heart and chronic kidney disease with heart failure and stage 1 through stage 4 chronic kidney disease, or unspecified chronic kidney disease: Secondary | ICD-10-CM | POA: Insufficient documentation

## 2018-01-04 DIAGNOSIS — N183 Chronic kidney disease, stage 3 (moderate): Secondary | ICD-10-CM | POA: Insufficient documentation

## 2018-01-04 DIAGNOSIS — I10 Essential (primary) hypertension: Secondary | ICD-10-CM | POA: Diagnosis not present

## 2018-01-04 DIAGNOSIS — Z7982 Long term (current) use of aspirin: Secondary | ICD-10-CM | POA: Diagnosis not present

## 2018-01-04 DIAGNOSIS — Z888 Allergy status to other drugs, medicaments and biological substances status: Secondary | ICD-10-CM | POA: Insufficient documentation

## 2018-01-04 DIAGNOSIS — E785 Hyperlipidemia, unspecified: Secondary | ICD-10-CM | POA: Diagnosis not present

## 2018-01-04 DIAGNOSIS — F17200 Nicotine dependence, unspecified, uncomplicated: Secondary | ICD-10-CM | POA: Diagnosis not present

## 2018-01-04 DIAGNOSIS — I429 Cardiomyopathy, unspecified: Secondary | ICD-10-CM | POA: Diagnosis not present

## 2018-01-04 DIAGNOSIS — F17210 Nicotine dependence, cigarettes, uncomplicated: Secondary | ICD-10-CM | POA: Insufficient documentation

## 2018-01-04 DIAGNOSIS — Z79899 Other long term (current) drug therapy: Secondary | ICD-10-CM | POA: Insufficient documentation

## 2018-01-04 DIAGNOSIS — I5042 Chronic combined systolic (congestive) and diastolic (congestive) heart failure: Secondary | ICD-10-CM | POA: Insufficient documentation

## 2018-01-04 NOTE — Patient Instructions (Signed)
Coping with Quitting Smoking Quitting smoking is a physical and mental challenge. You will face cravings, withdrawal symptoms, and temptation. Before quitting, work with your health care provider to make a plan that can help you cope. Preparation can help you quit and keep you from giving in. How can I cope with cravings? Cravings usually last for 5-10 minutes. If you get through it, the craving will pass. Consider taking the following actions to help you cope with cravings:  Keep your mouth busy: ? Chew sugar-free gum. ? Suck on hard candies or a straw. ? Brush your teeth.  Keep your hands and body busy: ? Immediately change to a different activity when you feel a craving. ? Squeeze or play with a ball. ? Do an activity or a hobby, like making bead jewelry, practicing needlepoint, or working with wood. ? Mix up your normal routine. ? Take a short exercise break. Go for a quick walk or run up and down stairs. ? Spend time in public places where smoking is not allowed.  Focus on doing something kind or helpful for someone else.  Call a friend or family member to talk during a craving.  Join a support group.  Call a quit line, such as 1-800-QUIT-NOW.  Talk with your health care provider about medicines that might help you cope with cravings and make quitting easier for you.  How can I deal with withdrawal symptoms? Your body may experience negative effects as it tries to get used to not having nicotine in the system. These effects are called withdrawal symptoms. They may include:  Feeling hungrier than normal.  Trouble concentrating.  Irritability.  Trouble sleeping.  Feeling depressed.  Restlessness and agitation.  Craving a cigarette.  To manage withdrawal symptoms:  Avoid places, people, and activities that trigger your cravings.  Remember why you want to quit.  Get plenty of sleep.  Avoid coffee and other caffeinated drinks. These may worsen some of your  symptoms.  How can I handle social situations? Social situations can be difficult when you are quitting smoking, especially in the first few weeks. To manage this, you can:  Avoid parties, bars, and other social situations where people might be smoking.  Avoid alcohol.  Leave right away if you have the urge to smoke.  Explain to your family and friends that you are quitting smoking. Ask for understanding and support.  Plan activities with friends or family where smoking is not an option.  What are some ways I can cope with stress? Wanting to smoke may cause stress, and stress can make you want to smoke. Find ways to manage your stress. Relaxation techniques can help. For example:  Breathe slowly and deeply, in through your nose and out through your mouth.  Listen to soothing, relaxing music.  Talk with a family member or friend about your stress.  Light a candle.  Soak in a bath or take a shower.  Think about a peaceful place.  What are some ways I can prevent weight gain? Be aware that many people gain weight after they quit smoking. However, not everyone does. To keep from gaining weight, have a plan in place before you quit and stick to the plan after you quit. Your plan should include:  Having healthy snacks. When you have a craving, it may help to: ? Eat plain popcorn, crunchy carrots, celery, or other cut vegetables. ? Chew sugar-free gum.  Changing how you eat: ? Eat small portion sizes at meals. ?   Eat 4-6 small meals throughout the day instead of 1-2 large meals a day. ? Be mindful when you eat. Do not watch television or do other things that might distract you as you eat.  Exercising regularly: ? Make time to exercise each day. If you do not have time for a long workout, do short bouts of exercise for 5-10 minutes several times a day. ? Do some form of strengthening exercise, like weight lifting, and some form of aerobic exercise, like running or  swimming.  Drinking plenty of water or other low-calorie or no-calorie drinks. Drink 6-8 glasses of water daily, or as much as instructed by your health care provider.  Summary  Quitting smoking is a physical and mental challenge. You will face cravings, withdrawal symptoms, and temptation to smoke again. Preparation can help you as you go through these challenges.  You can cope with cravings by keeping your mouth busy (such as by chewing gum), keeping your body and hands busy, and making calls to family, friends, or a helpline for people who want to quit smoking.  You can cope with withdrawal symptoms by avoiding places where people smoke, avoiding drinks with caffeine, and getting plenty of rest.  Ask your health care provider about the different ways to prevent weight gain, avoid stress, and handle social situations. This information is not intended to replace advice given to you by your health care provider. Make sure you discuss any questions you have with your health care provider. Document Released: 05/02/2016 Document Revised: 05/02/2016 Document Reviewed: 05/02/2016 Elsevier Interactive Patient Education  2018 [employer]  Steps to Quit Smoking Smoking tobacco can be bad for your health. It can also affect almost every organ in your body. Smoking puts you and people around you at risk for many serious long-lasting (chronic) diseases. Quitting smoking is hard, but it is one of the best things that you can do for your health. It is never too late to quit. What are the benefits of quitting smoking? When you quit smoking, you lower your risk for getting serious diseases and conditions. They can include:  Lung cancer or lung disease.  Heart disease.  Stroke.  Heart attack.  Not being able to have children (infertility).  Weak bones (osteoporosis) and broken bones (fractures).  If you have coughing, wheezing, and shortness of breath, those symptoms may get better when you  quit. You may also get sick less often. If you are pregnant, quitting smoking can help to lower your chances of having a baby of low birth weight. What can I do to help me quit smoking? Talk with your doctor about what can help you quit smoking. Some things you can do (strategies) include:  Quitting smoking totally, instead of slowly cutting back how much you smoke over a period of time.  Going to in-person counseling. You are more likely to quit if you go to many counseling sessions.  Using resources and support systems, such as: ? Online chats with a counselor. ? Phone quitlines. ? Printed self-help materials. ? Support groups or group counseling. ? Text messaging programs. ? Mobile phone apps or applications.  Taking medicines. Some of these medicines may have nicotine in them. If you are pregnant or breastfeeding, do not take any medicines to quit smoking unless your doctor says it is okay. Talk with your doctor about counseling or other things that can help you.  Talk with your doctor about using more than one strategy at the same time, such as   taking medicines while you are also going to in-person counseling. This can help make quitting easier. What things can I do to make it easier to quit? Quitting smoking might feel very hard at first, but there is a lot that you can do to make it easier. Take these steps:  Talk to your family and friends. Ask them to support and encourage you.  Call phone quitlines, reach out to support groups, or work with a counselor.  Ask people who smoke to not smoke around you.  Avoid places that make you want (trigger) to smoke, such as: ? Bars. ? Parties. ? Smoke-break areas at work.  Spend time with people who do not smoke.  Lower the stress in your life. Stress can make you want to smoke. Try these things to help your stress: ? Getting regular exercise. ? Deep-breathing exercises. ? Yoga. ? Meditating. ? Doing a body scan. To do this, close  your eyes, focus on one area of your body at a time from head to toe, and notice which parts of your body are tense. Try to relax the muscles in those areas.  Download or buy apps on your mobile phone or tablet that can help you stick to your quit plan. There are many free apps, such as QuitGuide from the CDC (Centers for Disease Control and Prevention). You can find more support from smokefree.gov and other websites.  This information is not intended to replace advice given to you by your health care provider. Make sure you discuss any questions you have with your health care provider. Document Released: 03/01/2009 Document Revised: 01/01/2016 Document Reviewed: 09/19/2014 Elsevier Interactive Patient Education  2018 [employer] Inc.  

## 2018-01-04 NOTE — Progress Notes (Signed)
Assessment & Plan:  Shawn [name] was seen today for establish care.  Diagnoses and all orders for this visit:  Essential hypertension Continue all antihypertensives as prescribed.  Remember to bring in your blood pressure log with you for your follow up appointment.  DASH/Mediterranean Diets are healthier choices for HTN.    Morbid obesity due to excess calories (HCC) Discussed diet and exercise for person with BMI >25. Instructed: You must burn more calories than you eat. Losing 5 percent of your body weight should be considered a success. In the longer term, losing more than 15 percent of your body weight and staying at this weight is an extremely good result. However, keep in mind that even losing 5 percent of your body weight leads to important health benefits, so try not to get discouraged if you're not able to lose more than this. Will recheck weight in 3-6 months.  Tobacco dependence Shawn [name] was counseled on the dangers of tobacco use, and was advised to quit. Reviewed strategies to maximize success, including removing cigarettes and smoking materials from environment, stress management and support of family/friends as well as pharmacological alternatives including: Wellbutrin, Chantix, Nicotine patch, Nicotine gum or lozenges. Smoking cessation support: smoking cessation hotline: 1-800-QUIT-NOW.  Smoking cessation classes are also available through [facility]. Call [phone] or visit our website at [email or web address].   A total of 3 minutes was spent on counseling for smoking cessation and Shawn [name] is not ready to quit.      Patient has been counseled on age-appropriate routine health concerns for screening and prevention. These are reviewed and up-to-date. Referrals have been placed accordingly. Immunizations are up-to-date or declined.    Subjective:   Chief Complaint  Patient presents with  . Establish Care    Pt. is here to establish care for  hypertension.    HPI Shawn [name] 32 y.o. male presents to office today to establish care for HTN. He has a history of Acute on Chronic CHF, cardiomyopathy, hyperlipidemia (taking zocor 20 mg daily), LV thrombus with completed anticoagulation, and chronic kidney disease stage II. He is still smoking. Weaning himself off. Currently smoking 2-3 cigarettes per day.    CHRONIC HYPERTENSION Disease Monitoring: he does not check his blood pressure at home. However blood pressure is well controlled today.   Blood pressure range BP Readings from Last 3 Encounters:  01/04/18 127/83  12/25/17 (!) 148/100  12/01/17 (!) 143/83   Chest pain: no   Dyspnea: no   Claudication: no  Medication compliance: yes, taking carvedilol 25 mg BID, diovan 320 mg daily, lasix 20 daily and hydralazine 25 mg BID.   Medication Side Effects  Lightheadedness: no   Urinary frequency: no   Edema: no   Impotence: no  Preventitive Healthcare:  Exercise: no   Diet Pattern: diet: general  Salt Restriction:  no   Review of Systems  Constitutional: Negative for fever, malaise/fatigue and weight loss.  HENT: Negative.  Negative for nosebleeds.   Eyes: Negative.  Negative for blurred vision, double vision and photophobia.  Respiratory: Negative.  Negative for cough and shortness of breath.   Cardiovascular: Negative.  Negative for chest pain, palpitations and leg swelling.  Gastrointestinal: Negative.  Negative for heartburn, nausea and vomiting.  Musculoskeletal: Negative.  Negative for myalgias.  Neurological: Negative.  Negative for dizziness, focal weakness, seizures and headaches.  Psychiatric/Behavioral: Negative.  Negative for suicidal ideas.    Past Medical History:  Diagnosis Date  . At risk for  sleep apnea    STOP-BANG= 4      SENT TO PCP 11-10-2014  . CKD (chronic kidney disease), stage III (HCC)   . Dyspnea on exertion   . Hyperlipidemia   . Hypertension    at age 80  . Malignant hypertension      dx age 84  . Nonischemic cardiomyopathy (HCC)   . Penile adhesions   . Systolic and diastolic CHF, chronic (HCC) dx 09-18-2014   cardiologist-  dr Rachelle Hora croitoru----  ef 25-30% per last note 11-03-2014    Past Surgical History:  Procedure Laterality Date  . CIRCUMCISION REVISION N/A 11/14/2014   Procedure: CIRCUMCISION REVISION, LYSIS OF ADHESIONS;  Surgeon: Su Grand, MD;  Location: [facility];  Service: Urology;  Laterality: N/A;  . NO PAST SURGERIES    . TRANSTHORACIC ECHOCARDIOGRAM  09-19-2014  dr croitoru   moderate LVH,  ef 35% (per dr croitoru note, ef 25-30%), diffuse hypokinesis of basal inferior myocardium,  trivial AR,  mild MR    Family History  Problem Relation Age of Onset  . Diabetes Mother   . Hypertension Mother   . Kidney disease Brother 32       on dialysis. never went to the doctor     Social History Reviewed with no changes to be made today.   Outpatient Medications Prior to Visit  Medication Sig Dispense Refill  . amLODipine (NORVASC) 10 MG tablet Take 0.5 tablets (5 mg total) by mouth daily. 90 tablet 3  . aspirin EC 81 MG EC tablet Take 1 tablet (81 mg total) by mouth daily. 30 tablet 0  . carvedilol (COREG) 25 MG tablet Take 1 tablet (25 mg total) by mouth 2 (two) times daily with a meal. 180 tablet 3  . DIOVAN 320 MG tablet Take 1 tablet (320 mg total) by mouth daily. 90 tablet 3  . furosemide (LASIX) 20 MG tablet Take 1 tablet (20 mg total) by mouth daily. 90 tablet 3  . hydrALAZINE (APRESOLINE) 25 MG tablet Take 1 tablet (25 mg total) by mouth 2 (two) times daily. 180 tablet 3  . simvastatin (ZOCOR) 20 MG tablet Take 1 tablet (20 mg total) by mouth daily. 90 tablet 3  . potassium chloride (K-DUR) 10 MEQ tablet Take 1 tablet (10 mEq total) by mouth daily. (Patient not taking: Reported on 12/25/2017) 60 tablet 0   No facility-administered medications prior to visit.     Allergies  Allergen Reactions  . Isosorbide Nitrate Other (See  Comments)    headaches  . Lisinopril Cough       Objective:    BP 127/83 (BP Location: Left Arm, Patient Position: Sitting, Cuff Size: Large)   Pulse 82   Temp 98.5 F (36.9 C) (Oral)   Ht 6' (1.829 m)   Wt (!) 310 lb (140.6 kg)   SpO2 96%   BMI 42.04 kg/m  Wt Readings from Last 3 Encounters:  01/04/18 (!) 310 lb (140.6 kg)  12/25/17 (!) 311 lb 6.4 oz (141.3 kg)  12/01/17 (!) 314 lb (142.4 kg)    Physical Exam  Constitutional: He is oriented to person, place, and time. He appears well-developed and well-nourished. He is cooperative.  HENT:  Head: Normocephalic and atraumatic.  Eyes: EOM are normal.  Neck: Normal range of motion.  Cardiovascular: Normal rate, regular rhythm and normal heart sounds. Exam reveals no gallop and no friction rub.  No murmur heard. Pulmonary/Chest: Effort normal and breath sounds normal. No tachypnea. No  respiratory distress. He has no decreased breath sounds. He has no wheezes. He has no rhonchi. He has no rales. He exhibits no tenderness.  Abdominal: Soft. Bowel sounds are normal.  Musculoskeletal: Normal range of motion. He exhibits no edema.  Neurological: He is alert and oriented to person, place, and time. Coordination normal.  Skin: Skin is warm and dry.  Psychiatric: He has a normal mood and affect. His behavior is normal. Judgment and thought content normal.  Nursing note and vitals reviewed.        Patient has been counseled extensively about nutrition and exercise as well as the importance of adherence with medications and regular follow-up. The patient was given clear instructions to go to ER or return to medical center if symptoms don't improve, worsen or new problems develop. The patient verbalized understanding.   Follow-up: Return in about 2 months (around 03/06/2018) for HTN.   Claiborne Rigg, FNP-BC [facility] [place], [place] [phone]   01/05/2018, 11:13 PM

## 2018-01-05 ENCOUNTER — Encounter: Payer: Self-pay | Admitting: Nurse Practitioner

## 2018-01-26 ENCOUNTER — Ambulatory Visit (INDEPENDENT_AMBULATORY_CARE_PROVIDER_SITE_OTHER): Payer: Medicaid Other | Admitting: Pharmacist

## 2018-01-26 VITALS — BP 144/96 | HR 76 | Ht 72.0 in | Wt 313.0 lb

## 2018-01-26 DIAGNOSIS — I5042 Chronic combined systolic (congestive) and diastolic (congestive) heart failure: Secondary | ICD-10-CM

## 2018-01-26 MED ORDER — SPIRONOLACTONE 25 MG PO TABS: 12.5000 mg | ORAL_TABLET | Freq: Every day | ORAL | 1 refills | Status: DC

## 2018-01-26 NOTE — Patient Instructions (Addendum)
Return for a a follow up appointment in  4 WEEKS  Go to the lab in Sept/20  Check your blood pressure at home daily (if able) and keep record of the readings.  Take your BP meds as follows: *START taking spironolactone 12.5mg  daily*   Bring all of your meds, your BP cuff and your record of home blood pressures to your next appointment.  Exercise as you're able, try to walk approximately 30 minutes per day.  Keep salt intake to a minimum, especially watch canned and prepared boxed foods.  Eat more fresh fruits and vegetables and fewer canned items.  Avoid eating in fast food restaurants.    HOW TO TAKE YOUR BLOOD PRESSURE: . Rest 5 minutes before taking your blood pressure. .  Don't smoke or drink caffeinated beverages for at least 30 minutes before. . Take your blood pressure before (not after) you eat. . Sit comfortably with your back supported and both feet on the floor (don't cross your legs). . Elevate your arm to heart level on a table or a desk. . Use the proper sized cuff. It should fit smoothly and snugly around your bare upper arm. There should be enough room to slip a fingertip under the cuff. The bottom edge of the cuff should be 1 inch above the crease of the elbow. . Ideally, take 3 measurements at one sitting and record the average.

## 2018-01-26 NOTE — Progress Notes (Signed)
HPI:  Shawn [name] is a 32 y.o. male patient of Dr Royann Shivers, with a PMH below who presents today for heart failure medication titration and hypertension management.  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    Is most recent echo showed an LVEF of 20-25%, down from 40-45%.  Of note, his EF was previously about 20% back in Aug 2016, but improved after several months. Patient reports mostly compliant with his medications.  He knows he needs to lose weight, eat better and increase his exercise, but is finding it difficult when he can't control his meals.    Patient denies problems with current therapy but admits drinking significant amount of "hard liquor"  And beer during his birthday party 3 days ago. Denies swelling, shortness or breath or difficulty sleeping. He continues to smoke 2-3 cigarettes per day.  He reports discontinuation of potassium supplement per PCP instructions.   Blood Pressure Goal:  130/80  Current Medications:  Amlodipine 10mg  daily  Carvedilol 25 mg bid  Valsartan 320 mg qd   Furosemide 20 mg qd   Hydralazine 25mg  bid  Family Hx:  Mother has DM, hypertension,   2 brothers - one on dialysis, one with heart disease  1 sister healthy  Social Hx:  Smokes 2-3 cigarettes, occasional marijuana; no more than 2 beers per week; 1-2 cans of soda per week  Diet:  Living in various places, eating whatever is put out.       Exercise:  Goes to gym walks the track 40-60 min, able to go 2-3 times per week  Home BP readings:  No home meter  Intolerances:   Lisinopril caused cough  Labs:  12/01/17:  Na 142, K 4.5, Glu 90, BUN 25, SCr 1.77  CrCl  11/24/17:  Na 140, K 4.7, Glu 88, BUN 25, SCr 1.85   Wt Readings from Last 3 Encounters:  01/26/18 (!) 313 lb (142 kg)  01/04/18 (!) 310 lb (140.6 kg)  12/25/17 (!) 311 lb 6.4 oz (141.3 kg)   BP Readings from Last 3 Encounters:  01/26/18  (!) 144/96  01/04/18 127/83  12/25/17 (!) 148/100   Pulse Readings from Last 3 Encounters:  01/26/18 76  01/04/18 82  12/25/17 72    Current Outpatient Medications  Medication Sig Dispense Refill  . amLODipine (NORVASC) 10 MG tablet Take 0.5 tablets (5 mg total) by mouth daily. 90 tablet 3  . aspirin EC 81 MG EC tablet Take 1 tablet (81 mg total) by mouth daily. 30 tablet 0  . carvedilol (COREG) 25 MG tablet Take 1 tablet (25 mg total) by mouth 2 (two) times daily with a meal. 180 tablet 3  . DIOVAN 320 MG tablet Take 1 tablet (320 mg total) by mouth daily. 90 tablet 3  . furosemide (LASIX) 20 MG tablet Take 1 tablet (20 mg total) by mouth daily. 90 tablet 3  . hydrALAZINE (APRESOLINE) 25 MG tablet Take 1 tablet (25 mg total) by mouth 2 (two) times daily. 180 tablet 3  . simvastatin (ZOCOR) 20 MG tablet Take 1 tablet (20 mg total) by mouth daily. 90 tablet 3  . spironolactone (ALDACTONE) 25 MG tablet Take 0.5 tablets (12.5 mg total) by mouth daily. 15 tablet 1   No current facility-administered medications for this visit.     Allergies  Allergen Reactions  . Isosorbide Nitrate Other (See Comments)    headaches  . Lisinopril  Cough    Past Medical History:  Diagnosis Date  . At risk for sleep apnea    STOP-BANG= 4      SENT TO PCP 11-10-2014  . CKD (chronic kidney disease), stage III (HCC)   . Dyspnea on exertion   . Hyperlipidemia   . Hypertension    at age 61  . Malignant hypertension    dx age 47  . Nonischemic cardiomyopathy (HCC)   . Penile adhesions   . Systolic and diastolic CHF, chronic (HCC) dx 09-18-2014   cardiologist-  dr Rachelle Hora croitoru----  ef 25-30% per last note 11-03-2014    Blood pressure (!) 144/96, pulse 76, height 6' (1.829 m), weight (!) 313 lb (142 kg).  Chronic combined systolic and diastolic CHF (congestive heart failure) (HCC) Blood pressure remains unchanged since amlodipine dose was increased to 10mg  daily. Patients blood pressure may be  acutely elevated after significant alcohol inatake less than 72 hours ago. Noted Scr and K are within normal limits; therefore, will add spironolactone 12.5mg  daily to his regimen for BP and HF benefits. Patient is to repeat BMEt in 2 weeks and follow up with [place] in 4 weeks for further medication titration. Plan to initiate ENTRESTO during next office visit if renal function remains stable.   Jaasia Viglione Rodriguez-Guzman PharmD, BCPS, CPP [facility] [address] [place] [address] 01/27/2018 8:01 AM

## 2018-01-27 ENCOUNTER — Encounter: Payer: Self-pay | Admitting: Pharmacist

## 2018-01-27 NOTE — Assessment & Plan Note (Signed)
Blood pressure remains unchanged since amlodipine dose was increased to 10mg  daily. Patients blood pressure may be acutely elevated after significant alcohol inatake less than 72 hours ago. Noted Scr and K are within normal limits; therefore, will add spironolactone 12.5mg  daily to his regimen for BP and HF benefits. Patient is to repeat BMEt in 2 weeks and follow up with [place] in 4 weeks for further medication titration. Plan to initiate ENTRESTO during next office visit if renal function remains stable.

## 2018-02-09 MED FILL — FUROSEMIDE 20 MG TABLET: 20 | 30 days supply | Qty: 30 | Fill #1

## 2018-02-09 MED FILL — SPIRONOLACTONE 25 MG TABLET: 25 | 30 days supply | Qty: 15 | Fill #0

## 2018-02-09 MED FILL — VALSARTAN 320 MG TAB: 320 | 30 days supply | Qty: 30 | Fill #1

## 2018-02-09 MED FILL — AMLODIPINE BESYLATE 10 MG T: 10 | 30 days supply | Qty: 15 | Fill #2

## 2018-02-09 MED FILL — SIMVASTATIN 20 MG TABLET: 20 | 30 days supply | Qty: 30 | Fill #1

## 2018-02-09 MED FILL — CARVEDILOL 25 MG TABLET: 25 | 30 days supply | Qty: 60 | Fill #1

## 2018-02-09 MED FILL — hydrALAZINE HCL 25 MG TABS: 25 | 30 days supply | Qty: 60 | Fill #2

## 2018-02-24 NOTE — Progress Notes (Deleted)
HPI:  Shawn [name] is a 32 y.o. male patient of Dr Royann Shivers, with a PMH below who presents today for heart failure medication titration and hypertension management.  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    Is most recent echo showed an LVEF of 20-25%, down from 40-45%.  Of note, his EF was previously about 20% back in Aug 2016, but improved after several months. Patient reports mostly compliant with his medications.    During most recent office visit spironolactone was initiated and potassium supplement was discontinued. Order to repeat BMET in 10 days was entered in Epic and hard copy was given to patient but no BMET was completed prior to today's visit.   .   Blood Pressure Goal:  130/80  Current Medications:  Amlodipine 10mg  daily  Carvedilol 25 mg twice daily  Valsartan 320 mg daily  Furosemide 20 mg daily  Hydralazine 25mg  twice daily  Spironolactone 12.5mg  daily  Family Hx:  Mother has DM, hypertension,   2 brothers - one on dialysis, one with heart disease  1 sister healthy  Social Hx:  Smokes 2-3 cigarettes, occasional marijuana; no more than 2 beers per week; 1-2 cans of soda per week  Diet:  Living in various places, eating whatever is put out.       Exercise:  Goes to gym walks the track 40-60 min, able to go 2-3 times per week  Home BP readings:  No home meter  Intolerances:   Lisinopril caused cough  Labs:  12/01/17:  Na 142, K 4.5, Glu 90, BUN 25, SCr 1.77  CrCl  11/24/17:  Na 140, K 4.7, Glu 88, BUN 25, SCr 1.85   Wt Readings from Last 3 Encounters:  01/26/18 (!) 313 lb (142 kg)  01/04/18 (!) 310 lb (140.6 kg)  12/25/17 (!) 311 lb 6.4 oz (141.3 kg)   BP Readings from Last 3 Encounters:  01/26/18 (!) 144/96  01/04/18 127/83  12/25/17 (!) 148/100   Pulse Readings from Last 3 Encounters:  01/26/18 76  01/04/18 82  12/25/17 72    Current Outpatient  Medications  Medication Sig Dispense Refill  . amLODipine (NORVASC) 10 MG tablet Take 0.5 tablets (5 mg total) by mouth daily. 90 tablet 3  . aspirin EC 81 MG EC tablet Take 1 tablet (81 mg total) by mouth daily. 30 tablet 0  . carvedilol (COREG) 25 MG tablet Take 1 tablet (25 mg total) by mouth 2 (two) times daily with a meal. 180 tablet 3  . DIOVAN 320 MG tablet Take 1 tablet (320 mg total) by mouth daily. 90 tablet 3  . furosemide (LASIX) 20 MG tablet Take 1 tablet (20 mg total) by mouth daily. 90 tablet 3  . hydrALAZINE (APRESOLINE) 25 MG tablet Take 1 tablet (25 mg total) by mouth 2 (two) times daily. 180 tablet 3  . simvastatin (ZOCOR) 20 MG tablet Take 1 tablet (20 mg total) by mouth daily. 90 tablet 3  . spironolactone (ALDACTONE) 25 MG tablet Take 0.5 tablets (12.5 mg total) by mouth daily. 15 tablet 1   No current facility-administered medications for this visit.     Allergies  Allergen Reactions  . Isosorbide Nitrate Other (See Comments)    headaches  . Lisinopril Cough    Past Medical History:  Diagnosis Date  . At risk for sleep apnea    STOP-BANG= 4      SENT  TO PCP 11-10-2014  . CKD (chronic kidney disease), stage III (HCC)   . Dyspnea on exertion   . Hyperlipidemia   . Hypertension    at age 82  . Malignant hypertension    dx age 44  . Nonischemic cardiomyopathy (HCC)   . Penile adhesions   . Systolic and diastolic CHF, chronic (HCC) dx 09-18-2014   cardiologist-  dr Rachelle Hora croitoru----  ef 25-30% per last note 11-03-2014    There were no vitals taken for this visit.  No problem-specific Assessment & Plan notes found for this encounter.   Timera Windt Rodriguez-Guzman PharmD, BCPS, CPP [facility] [address] [place] [address] 02/24/2018 8:16 PM

## 2018-02-25 ENCOUNTER — Ambulatory Visit: Payer: Medicaid Other

## 2018-04-05 MED FILL — hydrALAZINE HCL 25 MG TABS: 25 | 30 days supply | Qty: 60 | Fill #3

## 2018-04-05 MED FILL — FUROSEMIDE 20 MG TABLET: 20 | 30 days supply | Qty: 30 | Fill #2

## 2018-04-05 MED FILL — VALSARTAN 320 MG TAB: 320 | 30 days supply | Qty: 30 | Fill #2

## 2018-04-05 MED FILL — CARVEDILOL 25 MG TABLET: 25 | 30 days supply | Qty: 60 | Fill #2

## 2018-04-05 MED FILL — AMLODIPINE BESYLATE 10 MG T: 10 | 30 days supply | Qty: 15 | Fill #3

## 2018-04-05 MED FILL — SIMVASTATIN 20 MG TABLET: 20 | 30 days supply | Qty: 30 | Fill #2

## 2018-04-06 ENCOUNTER — Ambulatory Visit: Payer: Medicaid Other | Admitting: Nurse Practitioner

## 2018-05-08 ENCOUNTER — Encounter (HOSPITAL_COMMUNITY): Payer: Self-pay | Admitting: Emergency Medicine

## 2018-05-08 ENCOUNTER — Emergency Department (HOSPITAL_COMMUNITY)
Admission: EM | Admit: 2018-05-08 | Discharge: 2018-05-08 | Disposition: A | Discharge status: Home / Self Care | Payer: Medicaid Other | Attending: Emergency Medicine | Admitting: Emergency Medicine

## 2018-05-08 ENCOUNTER — Other Ambulatory Visit: Payer: Self-pay

## 2018-05-08 DIAGNOSIS — Z041 Encounter for examination and observation following transport accident: Secondary | ICD-10-CM | POA: Diagnosis present

## 2018-05-08 DIAGNOSIS — F1290 Cannabis use, unspecified, uncomplicated: Secondary | ICD-10-CM | POA: Diagnosis not present

## 2018-05-08 DIAGNOSIS — F17210 Nicotine dependence, cigarettes, uncomplicated: Secondary | ICD-10-CM | POA: Diagnosis not present

## 2018-05-08 DIAGNOSIS — I5042 Chronic combined systolic (congestive) and diastolic (congestive) heart failure: Secondary | ICD-10-CM | POA: Insufficient documentation

## 2018-05-08 DIAGNOSIS — N183 Chronic kidney disease, stage 3 (moderate): Secondary | ICD-10-CM | POA: Diagnosis not present

## 2018-05-08 DIAGNOSIS — I130 Hypertensive heart and chronic kidney disease with heart failure and stage 1 through stage 4 chronic kidney disease, or unspecified chronic kidney disease: Secondary | ICD-10-CM | POA: Insufficient documentation

## 2018-05-08 DIAGNOSIS — M79601 Pain in right arm: Secondary | ICD-10-CM | POA: Diagnosis not present

## 2018-05-08 DIAGNOSIS — M79604 Pain in right leg: Secondary | ICD-10-CM | POA: Diagnosis not present

## 2018-05-08 DIAGNOSIS — Z79899 Other long term (current) drug therapy: Secondary | ICD-10-CM | POA: Insufficient documentation

## 2018-05-08 DIAGNOSIS — I10 Essential (primary) hypertension: Secondary | ICD-10-CM | POA: Diagnosis not present

## 2018-05-08 DIAGNOSIS — R030 Elevated blood-pressure reading, without diagnosis of hypertension: Secondary | ICD-10-CM

## 2018-05-08 MED ORDER — BACLOFEN 10 MG PO TABS: 5.0000 mg | ORAL_TABLET | Freq: Three times a day (TID) | ORAL | 0 refills | Status: DC | PRN

## 2018-05-08 MED ORDER — MELOXICAM 15 MG PO TABS: 15.0000 mg | ORAL_TABLET | Freq: Every day | ORAL | 0 refills | Status: DC

## 2018-05-08 NOTE — Discharge Instructions (Addendum)
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
## 2018-05-08 NOTE — ED Notes (Signed)
Pt stable, ambulatory, states understanding of discharge instructions 

## 2018-05-25 MED FILL — hydrALAZINE HCL 25 MG TABS: 25 | 30 days supply | Qty: 60 | Fill #4

## 2018-05-25 MED FILL — VALSARTAN 160 MG TABLET: 160 | 30 days supply | Qty: 60 | Fill #0

## 2018-05-25 MED FILL — AMLODIPINE BESYLATE 10 MG T: 10 | 30 days supply | Qty: 15 | Fill #4

## 2018-05-25 MED FILL — FUROSEMIDE 20 MG TABLET: 20 | 30 days supply | Qty: 30 | Fill #3

## 2018-05-25 MED FILL — CARVEDILOL 25 MG TABLET: 25 | 30 days supply | Qty: 60 | Fill #3

## 2018-05-25 MED FILL — BACLOFEN 10 MG TABLET: 10 | 10 days supply | Qty: 30 | Fill #0

## 2018-05-25 MED FILL — MELOXICAM 15 MG TABLET: 15 | 10 days supply | Qty: 10 | Fill #0

## 2018-05-25 MED FILL — SIMVASTATIN 20 MG TABLET: 20 | 30 days supply | Qty: 30 | Fill #3

## 2018-07-27 MED FILL — CARVEDILOL 25 MG TABLET: 25 | 30 days supply | Qty: 60 | Fill #4

## 2018-07-27 MED FILL — SIMVASTATIN 20 MG TABLET: 20 | 30 days supply | Qty: 30 | Fill #4

## 2018-07-27 MED FILL — FUROSEMIDE 20 MG TABLET: 20 | 30 days supply | Qty: 30 | Fill #4

## 2018-07-27 MED FILL — hydrALAZINE HCL 25 MG TABS: 25 | 30 days supply | Qty: 60 | Fill #5

## 2018-07-27 MED FILL — VALSARTAN 160 MG TABLET: 160 | 30 days supply | Qty: 60 | Fill #1

## 2018-07-27 MED FILL — AMLODIPINE BESYLATE 10 MG T: 10 | 30 days supply | Qty: 15 | Fill #5

## 2018-08-30 MED FILL — AMLODIPINE BESYLATE 10 MG T: 10 | 30 days supply | Qty: 15 | Fill #6

## 2018-08-30 MED FILL — FUROSEMIDE 20 MG TABS: 20 | 90 days supply | Qty: 90 | Fill #5

## 2018-08-30 MED FILL — hydrALAZINE HCL 25 MG TABS: 25 | 30 days supply | Qty: 60 | Fill #6

## 2018-08-30 MED FILL — SIMVASTATIN 20 MG TABLET: 20 | 30 days supply | Qty: 30 | Fill #5

## 2018-08-30 MED FILL — CARVEDILOL 25 MG TABLET: 25 | 30 days supply | Qty: 60 | Fill #5

## 2018-09-06 ENCOUNTER — Other Ambulatory Visit: Payer: Self-pay

## 2018-09-16 ENCOUNTER — Telehealth: Payer: Self-pay

## 2018-09-17 NOTE — Telephone Encounter (Signed)
Please change to olmesartan 40 mg qd if he has not missed any doses. If he has been out, start with 20 mg qd and go to 40 mg qd as BP requires. Thank you

## 2018-09-24 NOTE — Telephone Encounter (Signed)
What do they have? That might be a simpler way to approach this. Would go with a low-moderate dose of ARB, up-titrate as needed. Happy to come up with a dose of whatever ARB is in stock.

## 2018-10-17 ENCOUNTER — Emergency Department (HOSPITAL_COMMUNITY)
Admission: EM | Admit: 2018-10-17 | Discharge: 2018-10-18 | Disposition: A | Discharge status: Home / Self Care | Payer: Medicaid Other | Attending: Emergency Medicine | Admitting: Emergency Medicine

## 2018-10-17 ENCOUNTER — Encounter (HOSPITAL_COMMUNITY): Payer: Self-pay | Admitting: *Deleted

## 2018-10-17 ENCOUNTER — Other Ambulatory Visit: Payer: Self-pay

## 2018-10-17 ENCOUNTER — Emergency Department (HOSPITAL_COMMUNITY): Payer: Medicaid Other

## 2018-10-17 DIAGNOSIS — F1210 Cannabis abuse, uncomplicated: Secondary | ICD-10-CM | POA: Insufficient documentation

## 2018-10-17 DIAGNOSIS — Z8673 Personal history of transient ischemic attack (TIA), and cerebral infarction without residual deficits: Secondary | ICD-10-CM | POA: Insufficient documentation

## 2018-10-17 DIAGNOSIS — F17210 Nicotine dependence, cigarettes, uncomplicated: Secondary | ICD-10-CM | POA: Diagnosis not present

## 2018-10-17 DIAGNOSIS — R071 Chest pain on breathing: Secondary | ICD-10-CM | POA: Insufficient documentation

## 2018-10-17 DIAGNOSIS — I130 Hypertensive heart and chronic kidney disease with heart failure and stage 1 through stage 4 chronic kidney disease, or unspecified chronic kidney disease: Secondary | ICD-10-CM | POA: Insufficient documentation

## 2018-10-17 DIAGNOSIS — I5042 Chronic combined systolic (congestive) and diastolic (congestive) heart failure: Secondary | ICD-10-CM | POA: Diagnosis not present

## 2018-10-17 DIAGNOSIS — R079 Chest pain, unspecified: Secondary | ICD-10-CM

## 2018-10-17 DIAGNOSIS — N183 Chronic kidney disease, stage 3 (moderate): Secondary | ICD-10-CM | POA: Diagnosis not present

## 2018-10-17 DIAGNOSIS — Z79899 Other long term (current) drug therapy: Secondary | ICD-10-CM | POA: Diagnosis not present

## 2018-10-17 DIAGNOSIS — R0781 Pleurodynia: Secondary | ICD-10-CM | POA: Diagnosis not present

## 2018-10-17 LAB — CBC
HCT: 44 % (ref 39.0–52.0)
Hemoglobin: 14.8 g/dL (ref 13.0–17.0)
MCH: 28.1 pg (ref 26.0–34.0)
MCHC: 33.6 g/dL (ref 30.0–36.0)
MCV: 83.7 fL (ref 80.0–100.0)
Platelets: 249 10*3/uL (ref 150–400)
RBC: 5.26 MIL/uL (ref 4.22–5.81)
RDW: 14.4 % (ref 11.5–15.5)
WBC: 6.2 10*3/uL (ref 4.0–10.5)
nRBC: 0 % (ref 0.0–0.2)

## 2018-10-17 LAB — BASIC METABOLIC PANEL
Anion gap: 10 (ref 5–15)
BUN: 21 mg/dL — ABNORMAL HIGH (ref 6–20)
CO2: 24 mmol/L (ref 22–32)
Calcium: 8.7 mg/dL — ABNORMAL LOW (ref 8.9–10.3)
Chloride: 107 mmol/L (ref 98–111)
Creatinine, Ser: 1.85 mg/dL — ABNORMAL HIGH (ref 0.61–1.24)
GFR calc Af Amer: 54 mL/min — ABNORMAL LOW (ref >60)
GFR calc non Af Amer: 47 mL/min — ABNORMAL LOW (ref >60)
Glucose, Bld: 96 mg/dL (ref 70–99)
Potassium: 3.7 mmol/L (ref 3.5–5.1)
Sodium: 141 mmol/L (ref 135–145)

## 2018-10-17 LAB — TROPONIN I: Troponin I: 0.11 ng/mL (ref <0.03)

## 2018-10-17 MED ORDER — SODIUM CHLORIDE 0.9% FLUSH: 3.0000 mL | Freq: Once | INTRAVENOUS | Status: DC

## 2018-10-17 NOTE — ED Notes (Signed)
ED Provider at bedside to discuss plan of care 

## 2018-10-17 NOTE — ED Notes (Signed)
Patient transported to X-ray 
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
## 2018-10-17 NOTE — ED Triage Notes (Addendum)
Pt reports onset of left sided chest pain, worse with a deep breath for several days. Pt says he is taking his meds, but he is out of his amlodipine and valsartan doses.

## 2018-10-17 NOTE — ED Provider Notes (Signed)
Assumed care from [name] and [name] at shift change.  See prior notes for full H&P.  Briefly, [name] here with pleuritic type chest pain that has been intermittent for 3 days.   History of mural thrombus, was on coumadin for this.  Has run out of some of his home meds.  Labs done with elevated troponin but chronically elevated and appears around baseline for him when compared with prior.  CKD at baseline as well.  CXR without acute findings.  Plan;  VQ and delta trop.  If VQ negative and troponin stays around baseline can d/c.  Will need to refill amlodipine and losartan if able to discharge.  Results for orders placed or performed during the hospital encounter of 10/17/18  Basic metabolic panel  Result Value Ref Range   Sodium 141 135 - 145 mmol/L   Potassium 3.7 3.5 - 5.1 mmol/L   Chloride 107 98 - 111 mmol/L   CO2 24 22 - 32 mmol/L   Glucose, Bld 96 70 - 99 mg/dL   BUN 21 (H) 6 - 20 mg/dL   Creatinine, Ser 6.50 (H) 0.61 - 1.24 mg/dL   Calcium 8.7 (L) 8.9 - 10.3 mg/dL   GFR calc non Af Amer 47 (L) >60 mL/min   GFR calc Af Amer 54 (L) >60 mL/min   Anion gap 10 5 - 15  CBC  Result Value Ref Range   WBC 6.2 4.0 - 10.5 K/uL   RBC 5.26 4.22 - 5.81 MIL/uL   Hemoglobin 14.8 13.0 - 17.0 g/dL   HCT 35.4 65.6 - 81.2 %   MCV 83.7 80.0 - 100.0 fL   MCH 28.1 26.0 - 34.0 pg   MCHC 33.6 30.0 - 36.0 g/dL   RDW 75.1 70.0 - 17.4 %   Platelets 249 150 - 400 K/uL   nRBC 0.0 0.0 - 0.2 %  Troponin I - ONCE - STAT  Result Value Ref Range   Troponin I 0.11 (HH) <0.03 ng/mL  Troponin I - Once-Timed  Result Value Ref Range   Troponin I 0.09 (HH) <0.03 ng/mL   Dg Chest 2 View  Result Date: 10/17/2018 CLINICAL DATA:  Chest pain without fever EXAM: CHEST - 2 VIEW COMPARISON:  11/13/2017 FINDINGS: Chronic cardiomegaly. There is no edema, consolidation, effusion, or pneumothorax. No acute osseous finding IMPRESSION: Stable cardiomegaly.  No acute finding. Electronically Signed   By: Marnee Spring  M.D.   On: 10/17/2018 22:03   Nm Pulmonary Vent And Perf (v/q Scan)  Result Date: 10/18/2018 CLINICAL DATA:  Pleuritic chest pain EXAM: NUCLEAR MEDICINE PERFUSION LUNG SCAN TECHNIQUE: Perfusion images were obtained in multiple projections after intravenous injection of radiopharmaceutical. Ventilation scans intentionally deferred if perfusion scan and chest x-ray adequate for interpretation during COVID 19 epidemic. RADIOPHARMACEUTICALS:  1.588 mCi Tc-48m MAA IV COMPARISON:  Chest x-ray 10/17/2018, V/Q scan 11/13/2017 FINDINGS: Homogeneous perfusion to the lungs without focal defect. Mild cardiomegaly. IMPRESSION: Negative.  No significant perfusion abnormalities. Electronically Signed   By: Jasmine Pang M.D.   On: 10/18/2018 02:43   Delta troponin trending down.  VQ scan without findings suggestive of PE.  Patient's BP stable, he has been resting comfortably, states he feels ok.  Appears stable for discharge.  We will refill his home BP meds.  Encouraged close follow-up with PCP.  Return here for any new/acute changes.   Garlon Hatchet, PA-C 10/18/18 0345    Ward, Layla Maw, DO 10/18/18 [phone]

## 2018-10-18 ENCOUNTER — Emergency Department (HOSPITAL_COMMUNITY): Payer: Medicaid Other

## 2018-10-18 DIAGNOSIS — R0781 Pleurodynia: Secondary | ICD-10-CM | POA: Diagnosis not present

## 2018-10-18 LAB — TROPONIN I: Troponin I: 0.09 ng/mL (ref <0.03)

## 2018-10-18 MED ORDER — TECHNETIUM TO 99M ALBUMIN AGGREGATED
1.5880 | Freq: Once | INTRAVENOUS | Status: AC | PRN
  Administered 2018-10-18: 1.588 via INTRAVENOUS

## 2018-10-18 MED ORDER — AMLODIPINE BESYLATE 5 MG PO TABS: 5.0000 mg | ORAL_TABLET | Freq: Every day | ORAL | 0 refills | Status: DC

## 2018-10-18 MED ORDER — VALSARTAN 160 MG PO TABS: 160.0000 mg | ORAL_TABLET | Freq: Two times a day (BID) | ORAL | 0 refills | Status: DC

## 2018-10-18 MED FILL — AMLODIPINE BESYLATE 5 MG TA: 5 | 30 days supply | Qty: 30 | Fill #0

## 2018-10-18 NOTE — ED Notes (Signed)
Patient transported to nuclear med for study

## 2018-10-18 NOTE — ED Notes (Signed)
ED Provider at bedside. 

## 2018-10-18 NOTE — Discharge Instructions (Signed)
Scan of your chest today did not show evidence of clot. I have refilled your home BP medications for 1 month, these are at your pharmacy. Please follow-up with your primary care doctor. Return here for new concerns or acute changes.

## 2018-10-19 MED FILL — hydrALAZINE HCL 25 MG TABS: 25 | 90 days supply | Qty: 180 | Fill #7

## 2018-10-19 MED FILL — SIMVASTATIN 20 MG TABLET: 20 | 90 days supply | Qty: 90 | Fill #6

## 2018-10-19 MED FILL — CARVEDILOL 25 MG TABLET: 25 | 90 days supply | Qty: 180 | Fill #6

## 2018-11-19 ENCOUNTER — Encounter (HOSPITAL_COMMUNITY): Payer: Self-pay | Admitting: Emergency Medicine

## 2018-11-19 ENCOUNTER — Emergency Department (HOSPITAL_BASED_OUTPATIENT_CLINIC_OR_DEPARTMENT_OTHER): Payer: Medicaid Other

## 2018-11-19 ENCOUNTER — Other Ambulatory Visit: Payer: Self-pay

## 2018-11-19 ENCOUNTER — Emergency Department (HOSPITAL_COMMUNITY)
Admission: EM | Admit: 2018-11-19 | Discharge: 2018-11-19 | Disposition: A | Discharge status: Home / Self Care | Payer: Medicaid Other | Attending: Emergency Medicine | Admitting: Emergency Medicine

## 2018-11-19 DIAGNOSIS — I129 Hypertensive chronic kidney disease with stage 1 through stage 4 chronic kidney disease, or unspecified chronic kidney disease: Secondary | ICD-10-CM | POA: Diagnosis not present

## 2018-11-19 DIAGNOSIS — I130 Hypertensive heart and chronic kidney disease with heart failure and stage 1 through stage 4 chronic kidney disease, or unspecified chronic kidney disease: Secondary | ICD-10-CM | POA: Diagnosis not present

## 2018-11-19 DIAGNOSIS — I5042 Chronic combined systolic (congestive) and diastolic (congestive) heart failure: Secondary | ICD-10-CM | POA: Diagnosis not present

## 2018-11-19 DIAGNOSIS — M79609 Pain in unspecified limb: Secondary | ICD-10-CM

## 2018-11-19 DIAGNOSIS — Z7982 Long term (current) use of aspirin: Secondary | ICD-10-CM | POA: Insufficient documentation

## 2018-11-19 DIAGNOSIS — I82442 Acute embolism and thrombosis of left tibial vein: Secondary | ICD-10-CM

## 2018-11-19 DIAGNOSIS — F17210 Nicotine dependence, cigarettes, uncomplicated: Secondary | ICD-10-CM | POA: Diagnosis not present

## 2018-11-19 DIAGNOSIS — N183 Chronic kidney disease, stage 3 unspecified: Secondary | ICD-10-CM

## 2018-11-19 DIAGNOSIS — R030 Elevated blood-pressure reading, without diagnosis of hypertension: Secondary | ICD-10-CM

## 2018-11-19 DIAGNOSIS — Z79899 Other long term (current) drug therapy: Secondary | ICD-10-CM | POA: Diagnosis not present

## 2018-11-19 DIAGNOSIS — M79605 Pain in left leg: Secondary | ICD-10-CM | POA: Diagnosis present

## 2018-11-19 LAB — BASIC METABOLIC PANEL
Anion gap: 9 (ref 5–15)
BUN: 21 mg/dL — ABNORMAL HIGH (ref 6–20)
CO2: 24 mmol/L (ref 22–32)
Calcium: 9.1 mg/dL (ref 8.9–10.3)
Chloride: 104 mmol/L (ref 98–111)
Creatinine, Ser: 1.96 mg/dL — ABNORMAL HIGH (ref 0.61–1.24)
GFR calc Af Amer: 51 mL/min — ABNORMAL LOW (ref >60)
GFR calc non Af Amer: 44 mL/min — ABNORMAL LOW (ref >60)
Glucose, Bld: 106 mg/dL — ABNORMAL HIGH (ref 70–99)
Potassium: 3.8 mmol/L (ref 3.5–5.1)
Sodium: 137 mmol/L (ref 135–145)

## 2018-11-19 LAB — MAGNESIUM: Magnesium: 1.8 mg/dL (ref 1.7–2.4)

## 2018-11-19 MED ORDER — ELIQUIS 5 MG VTE STARTER PACK: ORAL_TABLET | ORAL | 0 refills | Status: DC

## 2018-11-19 MED ORDER — LIDOCAINE 5 % EX PTCH
1.0000 | MEDICATED_PATCH | CUTANEOUS | Status: DC
  Administered 2018-11-19: 1 via TRANSDERMAL
  Filled 2018-11-19: qty 1

## 2018-11-19 MED ORDER — APIXABAN 5 MG PO TABS
10.0000 mg | ORAL_TABLET | Freq: Once | ORAL | Status: AC
  Administered 2018-11-19: 10 mg via ORAL
  Filled 2018-11-19: qty 2

## 2018-11-19 MED ORDER — APIXABAN (ELIQUIS) EDUCATION KIT FOR DVT/PE PATIENTS
PACK | Freq: Once | Status: AC
  Administered 2018-11-19: 20:00:00
  Filled 2018-11-19: qty 1

## 2018-11-19 NOTE — ED Notes (Signed)
Called pharmacy for Eliquis tablet and packet.

## 2018-11-19 NOTE — ED Triage Notes (Signed)
Pt c/o left calf pain x 4 days. Pt reports pain started after traveling to [facility]. Denies chest pain/shortness of breath.

## 2018-11-19 NOTE — Progress Notes (Signed)
Left lower extremity venous duplex completed. Preliminary results in Chart review CV Proc. [place] Raynard Mapps,RVS 11/19/2018,6:18 PM

## 2018-11-19 NOTE — Progress Notes (Signed)
ANTICOAGULATION CONSULT NOTE - Initial Consult  Pharmacy Consult for Eliquis Indication: DVT  Allergies  Allergen Reactions  . Isosorbide Nitrate Other (See Comments)    headaches  . Lisinopril Cough    Patient Measurements: Weight: (!) 320 lb (145.2 kg)   Vital Signs: Temp: 98.9 F (37.2 C) (07/03 1601) Temp Source: Oral (07/03 1601) BP: 165/107 (07/03 1851) Pulse Rate: 99 (07/03 1851)  Labs: Recent Labs    11/19/18 1808  CREATININE 1.96*    Estimated Creatinine Clearance: 78.3 mL/min (A) (by C-G formula based on SCr of 1.96 mg/dL (H)).   Medical History: Past Medical History:  Diagnosis Date  . At risk for sleep apnea    STOP-BANG= 4      SENT TO PCP 11-10-2014  . CKD (chronic kidney disease), stage III ([place])   . Dyspnea on exertion   . Hyperlipidemia   . Hypertension    at age 61  . Malignant hypertension    dx age 17  . Nonischemic cardiomyopathy ([place])   . Penile adhesions   . Systolic and diastolic CHF, chronic ([place]) dx 09-18-2014   cardiologist-  dr Dani Gobble croitoru----  ef 25-30% per last note 11-03-2014   Assessment: 33 yo male with a DVT. Pharmacy consulted to dose Eliquis. Patient's SCR 1.96, CrCL 78 ml/min   Plan:  Eliquis 10 mg twice daily for 7 days followed by 5 mg twice daily.   Alanda Slim, PharmD, [facility] Clinical Pharmacist Please see AMION for all Pharmacists' Contact Phone Numbers 11/19/2018, 7:08 PM

## 2018-11-19 NOTE — Progress Notes (Signed)
Left lower extremity venous duplex completed. Preliminary results in Chart review CV Proc. [place] Marisal Swarey,RVS 11/19/2018 8:37 PM
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
## 2018-11-19 NOTE — Discharge Instructions (Signed)
Please come back to the emergency department if you develop any worsening pain, chest pain, shortness of breath, feeling that you have poor tolerance with exerting yourself, or any new or worsening symptoms.  Please call the cardiologist that I listed.  Thank you for allowing [place] to participate in your care today.

## 2018-11-22 ENCOUNTER — Emergency Department (HOSPITAL_COMMUNITY): Payer: Medicaid Other

## 2018-11-22 ENCOUNTER — Emergency Department (HOSPITAL_COMMUNITY)
Admission: EM | Admit: 2018-11-22 | Discharge: 2018-11-22 | Disposition: A | Discharge status: Home / Self Care | Payer: Medicaid Other | Attending: Emergency Medicine | Admitting: Emergency Medicine

## 2018-11-22 ENCOUNTER — Other Ambulatory Visit: Payer: Self-pay

## 2018-11-22 DIAGNOSIS — Z7901 Long term (current) use of anticoagulants: Secondary | ICD-10-CM | POA: Diagnosis not present

## 2018-11-22 DIAGNOSIS — I130 Hypertensive heart and chronic kidney disease with heart failure and stage 1 through stage 4 chronic kidney disease, or unspecified chronic kidney disease: Secondary | ICD-10-CM | POA: Insufficient documentation

## 2018-11-22 DIAGNOSIS — Z79899 Other long term (current) drug therapy: Secondary | ICD-10-CM | POA: Diagnosis not present

## 2018-11-22 DIAGNOSIS — I5042 Chronic combined systolic (congestive) and diastolic (congestive) heart failure: Secondary | ICD-10-CM | POA: Diagnosis not present

## 2018-11-22 DIAGNOSIS — F17210 Nicotine dependence, cigarettes, uncomplicated: Secondary | ICD-10-CM | POA: Diagnosis not present

## 2018-11-22 DIAGNOSIS — R52 Pain, unspecified: Secondary | ICD-10-CM | POA: Diagnosis not present

## 2018-11-22 DIAGNOSIS — N183 Chronic kidney disease, stage 3 (moderate): Secondary | ICD-10-CM | POA: Diagnosis not present

## 2018-11-22 DIAGNOSIS — Z209 Contact with and (suspected) exposure to unspecified communicable disease: Secondary | ICD-10-CM | POA: Diagnosis not present

## 2018-11-22 DIAGNOSIS — R918 Other nonspecific abnormal finding of lung field: Secondary | ICD-10-CM | POA: Diagnosis not present

## 2018-11-22 DIAGNOSIS — R05 Cough: Secondary | ICD-10-CM | POA: Diagnosis not present

## 2018-11-22 DIAGNOSIS — R0602 Shortness of breath: Secondary | ICD-10-CM

## 2018-11-22 DIAGNOSIS — I499 Cardiac arrhythmia, unspecified: Secondary | ICD-10-CM | POA: Diagnosis not present

## 2018-11-22 LAB — CBC
HCT: 43.6 % (ref 39.0–52.0)
Hemoglobin: 14.4 g/dL (ref 13.0–17.0)
MCH: 28.2 pg (ref 26.0–34.0)
MCHC: 33 g/dL (ref 30.0–36.0)
MCV: 85.3 fL (ref 80.0–100.0)
Platelets: 296 10*3/uL (ref 150–400)
RBC: 5.11 MIL/uL (ref 4.22–5.81)
RDW: 14.1 % (ref 11.5–15.5)
WBC: 8.9 10*3/uL (ref 4.0–10.5)
nRBC: 0 % (ref 0.0–0.2)

## 2018-11-22 LAB — BASIC METABOLIC PANEL
Anion gap: 10 (ref 5–15)
BUN: 28 mg/dL — ABNORMAL HIGH (ref 6–20)
CO2: 25 mmol/L (ref 22–32)
Calcium: 8.9 mg/dL (ref 8.9–10.3)
Chloride: 103 mmol/L (ref 98–111)
Creatinine, Ser: 1.93 mg/dL — ABNORMAL HIGH (ref 0.61–1.24)
GFR calc Af Amer: 52 mL/min — ABNORMAL LOW (ref >60)
GFR calc non Af Amer: 44 mL/min — ABNORMAL LOW (ref >60)
Glucose, Bld: 100 mg/dL — ABNORMAL HIGH (ref 70–99)
Potassium: 3.7 mmol/L (ref 3.5–5.1)
Sodium: 138 mmol/L (ref 135–145)

## 2018-11-22 LAB — BRAIN NATRIURETIC PEPTIDE: B Natriuretic Peptide: 272 pg/mL — ABNORMAL HIGH (ref 0.0–100.0)

## 2018-11-22 NOTE — ED Provider Notes (Signed)
[name] Department Provider Note MRN:  [id number]  Arrival date & time: 11/22/18     Chief Complaint   Shortness of Breath   History of Present Illness   Shawn JordanDesmond R [name] is a 33 y.o. year-old male with a history of CKD, hypertension, CHF presenting to the ED with chief complaint of shortness of breath.  Patient explains that he was walking out in the heat today and after walking for a while became short of breath.  This occurred about 30 minutes prior to arrival.  Presented here for evaluation.  Patient denies fever or cough, denies any chest pain, shortness of breath has since resolved.  Denies abdominal pain, no numbness or weakness to the arms or legs.  Review of Systems  A complete 10 system review of systems was obtained and all systems are negative except as noted in the HPI and PMH.   Patient's Health History    Past Medical History:  Diagnosis Date  . At risk for sleep apnea    STOP-BANG= 4      SENT TO PCP 11-10-2014  . CKD (chronic kidney disease), stage III (HCC)   . Dyspnea on exertion   . Hyperlipidemia   . Hypertension    at age 33  . Malignant hypertension    dx age 33  . Nonischemic cardiomyopathy (HCC)   . Penile adhesions   . Systolic and diastolic CHF, chronic (HCC) dx 09-18-2014   cardiologist-  dr Rachelle Horamihai croitoru----  ef 25-30% per last note 11-03-2014    Past Surgical History:  Procedure Laterality Date  . CIRCUMCISION REVISION N/A 11/14/2014   Procedure: CIRCUMCISION REVISION, LYSIS OF ADHESIONS;  Surgeon: Su GrandMarc Nesi, MD;  [facility];  Service: Urology;  Laterality: N/A;  . NO PAST SURGERIES    . TRANSTHORACIC ECHOCARDIOGRAM  09-19-2014  dr croitoru   moderate LVH,  ef 35% (per dr croitoru note, ef 25-30%), diffuse hypokinesis of basal inferior myocardium,  trivial AR,  mild MR    Family History  Problem Relation Age of Onset  . Diabetes Mother   . Hypertension Mother   . Kidney disease  Brother 945       on dialysis. never went to the doctor     Social History   Socioeconomic History  . Marital status: Single    Spouse name: Not on file  . Number of children: Not on file  . Years of education: Not on file  . Highest education level: Not on file  Occupational History  . 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 Financial resource strain: Not on file  . Food insecurity    Worry: Not on file    Inability: Not on file  . Transportation needs    Medical: Not on file    Non-medical: Not on file  Tobacco Use  . Smoking status: Current Every Day Smoker    Packs/day: 0.50    Years: 10.00    Pack years: 5.00    Types: Cigarettes    Last attempt to quit: 04/19/2014    Years since quitting: 4.5  . Smokeless tobacco: Never Used  Substance and Sexual Activity  . Alcohol use: Yes    Alcohol/week: 0.0 standard drinks    Comment: 3 beers per week   . Drug use: Yes    Types: Marijuana  . Sexual activity: Yes  Lifestyle  . Physical activity    Days per week: Not on file  Minutes per session: Not on file  . Stress: Not on file  Relationships  . Social Herbalist on phone: Not on file    Gets together: Not on file    Attends religious service: Not on file    Active member of club or organization: Not on file    Attends meetings of clubs or organizations: Not on file    Relationship status: Not on file  . Intimate partner violence    Fear of current or ex partner: Not on file    Emotionally abused: Not on file    Physically abused: Not on file    Forced sexual activity: Not on file  Other Topics Concern  . Not on file  Social History Narrative  . Not on file     Physical Exam  Vital Signs and Nursing Notes reviewed Vitals:   11/22/18 1830 11/22/18 1900  BP: (!) 164/99 (!) 163/108  Pulse: (!) 101 99  Resp: (!) 24 18  SpO2: 96% 96%    CONSTITUTIONAL: Well-appearing, NAD NEURO:  Alert and oriented x 3, no focal deficits EYES:  eyes equal and  reactive ENT/NECK:  no LAD, no JVD CARDIO: Regular rate, well-perfused, normal S1 and S2 PULM:  CTAB no wheezing or rhonchi GI/GU:  normal bowel sounds, non-distended, non-tender MSK/SPINE:  No gross deformities, no edema SKIN:  no rash, atraumatic PSYCH:  Appropriate speech and behavior  Diagnostic and Interventional Summary    EKG Interpretation  Date/Time:  Monday November 22 2018 18:25:32 EDT Ventricular Rate:  99 PR Interval:    QRS Duration: 109 QT Interval:  382 QTC Calculation: 491 R Axis:   39 Text Interpretation:  Sinus rhythm Left atrial enlargement Prolonged QT interval Confirmed by Gerlene Fee [phone]) on 11/22/2018 6:33:09 PM      Labs Reviewed  BASIC METABOLIC PANEL - Abnormal; Notable for the following components:      Result Value   Glucose, Bld 100 (*)    BUN 28 (*)    Creatinine, Ser 1.93 (*)    GFR calc non Af Amer 44 (*)    GFR calc Af Amer 52 (*)    All other components within normal limits  BRAIN NATRIURETIC PEPTIDE - Abnormal; Notable for the following components:   B Natriuretic Peptide 272.0 (*)    All other components within normal limits  CBC    DG Chest Port 1 View  Final Result      Medications - No data to display   Procedures Critical Care  ED Course and Medical Decision Making  I have reviewed the triage vital signs and the nursing notes.  Pertinent labs & imaging results that were available during my care of the patient were reviewed by me and considered in my medical decision making (see below for details).  Transient shortness of breath in this 33 year old male with multiple comorbidities given his age, including nonischemic cardiomyopathy, CKD, and more recently deep vein thrombosis diagnosed only a few days ago.  Has been taking Eliquis as directed for the past 3 days.  Differential diagnosis includes PE, CHF exacerbation, pneumothorax, pneumonia, however it is much more likely by patient became short of breath related to the  exertion in the heat combined with his limitations with regard to his heart failure.  Patient is now without complaints, normal vital signs, no increased work of breathing, lungs clear, euvolemic on exam.  His CKD takes away the option of CT imaging today, which arguably would  be of little benefit given that he is already being treated for VTE and he shows no signs of significant or large PE if it were present, which he likely is not.  Patient's EKG shows no changes and he denies ever having chest pain, therefore there is little to no concern for ACS.  Will obtain basic labs and chest x-ray and likely discharge with strict return precautions.  EKG reveals nonspecific atelectasis, BNP mildly elevated but at patient's baseline.  Patient has not experienced any chest pain or shortness of breath during his entire ED stay, normal vital signs, no increased work of breathing, he is appropriate for discharge with strict return precautions.  After the discussed management above, the patient was determined to be safe for discharge.  The patient was in agreement with this plan and all questions regarding their care were answered.  ED return precautions were discussed and the patient will return to the ED with any significant worsening of condition.  Elmer Sow. Pilar Plate, MD [facility] Emergency Medicine [name] [email or web address]  Final Clinical Impressions(s) / ED Diagnoses     ICD-10-CM   1. SOB (shortness of breath)  R06.02 DG Chest [facility]    DG Chest [name]    ED Discharge Orders    None         Sabas Sous, MD 11/22/18 2031

## 2018-11-22 NOTE — ED Triage Notes (Signed)
Pt to ED via EMS from home. Pt c/o cough x 5 days. Reports recently at [facility] for calf pain dx with blood clot. Pt now c/ o cough, SHOB and coughing pink sputum. Hx stroke, no residual deficit, Hx HTN. Denies fever.

## 2018-11-22 NOTE — Discharge Instructions (Addendum)
You were evaluated in the Emergency Department and after careful evaluation, we did not find any emergent condition requiring admission or further testing in the hospital.  Your testing today was reassuring.  Please continue to take your home medications as directed and follow-up with your regular doctor.  Please return to the Emergency Department if you experience any worsening of your condition.  We encourage you to follow up with a primary care provider.  Thank you for allowing [place] to be a part of your care.

## 2018-11-22 NOTE — ED Notes (Signed)
Bed: PQ24 Expected date:  Expected time:  Means of arrival:  Comments: EMS COVID?

## 2018-11-23 ENCOUNTER — Other Ambulatory Visit: Payer: Self-pay | Admitting: Physician Assistant

## 2018-11-23 ENCOUNTER — Encounter (HOSPITAL_COMMUNITY): Payer: Self-pay | Admitting: Emergency Medicine

## 2018-11-23 ENCOUNTER — Emergency Department (HOSPITAL_COMMUNITY): Payer: Medicaid Other

## 2018-11-23 ENCOUNTER — Emergency Department (HOSPITAL_COMMUNITY)
Admission: EM | Admit: 2018-11-23 | Discharge: 2018-11-23 | Disposition: A | Discharge status: Home / Self Care | Payer: Medicaid Other | Attending: Emergency Medicine | Admitting: Emergency Medicine

## 2018-11-23 DIAGNOSIS — N183 Chronic kidney disease, stage 3 (moderate): Secondary | ICD-10-CM | POA: Diagnosis not present

## 2018-11-23 DIAGNOSIS — I5020 Unspecified systolic (congestive) heart failure: Secondary | ICD-10-CM | POA: Diagnosis not present

## 2018-11-23 DIAGNOSIS — F1210 Cannabis abuse, uncomplicated: Secondary | ICD-10-CM | POA: Insufficient documentation

## 2018-11-23 DIAGNOSIS — I5043 Acute on chronic combined systolic (congestive) and diastolic (congestive) heart failure: Secondary | ICD-10-CM

## 2018-11-23 DIAGNOSIS — R778 Other specified abnormalities of plasma proteins: Secondary | ICD-10-CM

## 2018-11-23 DIAGNOSIS — Z20828 Contact with and (suspected) exposure to other viral communicable diseases: Secondary | ICD-10-CM | POA: Diagnosis not present

## 2018-11-23 DIAGNOSIS — R7989 Other specified abnormal findings of blood chemistry: Secondary | ICD-10-CM | POA: Insufficient documentation

## 2018-11-23 DIAGNOSIS — I130 Hypertensive heart and chronic kidney disease with heart failure and stage 1 through stage 4 chronic kidney disease, or unspecified chronic kidney disease: Secondary | ICD-10-CM | POA: Insufficient documentation

## 2018-11-23 DIAGNOSIS — I5023 Acute on chronic systolic (congestive) heart failure: Secondary | ICD-10-CM | POA: Diagnosis not present

## 2018-11-23 DIAGNOSIS — Z79899 Other long term (current) drug therapy: Secondary | ICD-10-CM | POA: Insufficient documentation

## 2018-11-23 DIAGNOSIS — I5042 Chronic combined systolic (congestive) and diastolic (congestive) heart failure: Secondary | ICD-10-CM | POA: Insufficient documentation

## 2018-11-23 DIAGNOSIS — F17210 Nicotine dependence, cigarettes, uncomplicated: Secondary | ICD-10-CM | POA: Insufficient documentation

## 2018-11-23 DIAGNOSIS — Z7982 Long term (current) use of aspirin: Secondary | ICD-10-CM | POA: Diagnosis not present

## 2018-11-23 DIAGNOSIS — R0601 Orthopnea: Secondary | ICD-10-CM | POA: Insufficient documentation

## 2018-11-23 DIAGNOSIS — I509 Heart failure, unspecified: Secondary | ICD-10-CM

## 2018-11-23 DIAGNOSIS — R0789 Other chest pain: Secondary | ICD-10-CM | POA: Diagnosis not present

## 2018-11-23 DIAGNOSIS — R0602 Shortness of breath: Secondary | ICD-10-CM | POA: Diagnosis not present

## 2018-11-23 LAB — CBC
HCT: 39.1 % (ref 39.0–52.0)
Hemoglobin: 13.1 g/dL (ref 13.0–17.0)
MCH: 28.4 pg (ref 26.0–34.0)
MCHC: 33.5 g/dL (ref 30.0–36.0)
MCV: 84.8 fL (ref 80.0–100.0)
Platelets: 295 10*3/uL (ref 150–400)
RBC: 4.61 MIL/uL (ref 4.22–5.81)
RDW: 14 % (ref 11.5–15.5)
WBC: 7.3 10*3/uL (ref 4.0–10.5)
nRBC: 0 % (ref 0.0–0.2)

## 2018-11-23 LAB — TROPONIN I (HIGH SENSITIVITY)
Troponin I (High Sensitivity): 285 ng/L (ref <18)
Troponin I (High Sensitivity): 258 ng/L (ref <18)

## 2018-11-23 LAB — SARS CORONAVIRUS 2 BY RT PCR (HOSPITAL ORDER, PERFORMED IN ~~LOC~~ HOSPITAL LAB): SARS Coronavirus 2: NEGATIVE

## 2018-11-23 LAB — BRAIN NATRIURETIC PEPTIDE: B Natriuretic Peptide: 413.4 pg/mL — ABNORMAL HIGH (ref 0.0–100.0)

## 2018-11-23 LAB — BASIC METABOLIC PANEL
Anion gap: 10 (ref 5–15)
BUN: 22 mg/dL — ABNORMAL HIGH (ref 6–20)
CO2: 22 mmol/L (ref 22–32)
Calcium: 8.6 mg/dL — ABNORMAL LOW (ref 8.9–10.3)
Chloride: 106 mmol/L (ref 98–111)
Creatinine, Ser: 1.69 mg/dL — ABNORMAL HIGH (ref 0.61–1.24)
GFR calc Af Amer: >60 mL/min (ref >60)
GFR calc non Af Amer: 52 mL/min — ABNORMAL LOW (ref >60)
Glucose, Bld: 117 mg/dL — ABNORMAL HIGH (ref 70–99)
Potassium: 3.6 mmol/L (ref 3.5–5.1)
Sodium: 138 mmol/L (ref 135–145)

## 2018-11-23 MED ORDER — HYDRALAZINE HCL 25 MG PO TABS: 25.0000 mg | ORAL_TABLET | Freq: Two times a day (BID) | ORAL | 3 refills | Status: DC

## 2018-11-23 MED ORDER — FUROSEMIDE 10 MG/ML IJ SOLN
40.0000 mg | Freq: Once | INTRAMUSCULAR | Status: AC
  Administered 2018-11-23: 40 mg via INTRAVENOUS
  Filled 2018-11-23: qty 4

## 2018-11-23 MED ORDER — VALSARTAN 160 MG PO TABS: 160.0000 mg | ORAL_TABLET | Freq: Two times a day (BID) | ORAL | 3 refills | Status: DC

## 2018-11-23 MED ORDER — FUROSEMIDE 40 MG PO TABS: 40.0000 mg | ORAL_TABLET | Freq: Every day | ORAL | 3 refills | Status: DC

## 2018-11-23 MED ORDER — SODIUM CHLORIDE 0.9% FLUSH
3.0000 mL | Freq: Once | INTRAVENOUS | Status: AC
  Administered 2018-11-23: 3 mL via INTRAVENOUS

## 2018-11-23 MED ORDER — CARVEDILOL 25 MG PO TABS: 25.0000 mg | ORAL_TABLET | Freq: Two times a day (BID) | ORAL | 3 refills | Status: DC

## 2018-11-23 MED ORDER — IOHEXOL 350 MG/ML SOLN
100.0000 mL | Freq: Once | INTRAVENOUS | Status: AC | PRN
  Administered 2018-11-23: 100 mL via INTRAVENOUS

## 2018-11-23 NOTE — ED Provider Notes (Signed)
  Physical Exam  BP 125/68   Pulse (!) 105   Temp 98.3 F (36.8 C) (Oral)   Resp (!) 27   Ht 5\' 11"  (1.803 m)   Wt (!) 141.1 kg   SpO2 96%   BMI 43.38 kg/m   Physical Exam  ED Course/Procedures     Procedures  MDM  Patient's been seen by cardiology.  Feels much better.  Improved dyspnea.  Has been cleared for discharge home.  Had medication adjustments and follow-up organized by cardiology       Davonna Belling, MD 11/23/18 1900

## 2018-11-23 NOTE — ED Notes (Signed)
Dr Billy Fischer notified of Troponin 285.

## 2018-11-23 NOTE — Consult Note (Addendum)
Cardiology Consultation:   Patient ID: Shawn [name]; [id number]; 07-11-1985   Admit date: 11/23/2018 Date of Consult: 11/23/2018  Primary Care Provider: Dessa Phi, MD Primary Cardiologist: Thurmon Fair, MD 01/10/2016 Theodore Demark, PA-C, 11/24/2017 Primary Electrophysiologist:  None   Patient Profile:   Shawn [name] is a 33 y.o. 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  History of Present Illness:   Mr. Shawn [name] had not been seen by cardiology in a year.  He had been taking his medication, although he had been out of the losartan for several months.  He has not been weighing himself daily.  He has not been extremely vigilant about the amount of sodium in foods, although he tries to avoid it.  States he was not having any problems with dyspnea on exertion, felt like he had a good activity level and was not having any chest pain.  Then, he took a trip to [place] to get some fireworks.  He also ate fast food.  He feels that is what gave him the DVT and he may have gotten some extra salt at that time also.  He did not notice any lower extremity edema, but got leg pain that was quite severe.  After 3 days, he came to the ER and was diagnosed with DVT.  He was started on Eliquis.  After he came back from [place], he also noticed increasing dyspnea on exertion.  He was also sleeping poorly, but part of that was pain in his leg.  Since he came to the emergency room, he has received Lasix 40 mg IV with good urine output and improvement in his respiratory status.  He was seen in the ER 5/31 for chest pain that had been intermittent.  VQ scan was negative and troponins were no higher than his baseline.  Treated and  released.   Past Medical History:  Diagnosis Date   At risk for sleep apnea    STOP-BANG= 4      SENT TO PCP 11-10-2014   CKD (chronic kidney disease), stage III (HCC)    Dyspnea on exertion    Hyperlipidemia    Hypertension    at age 20   Malignant hypertension    dx age 8   Nonischemic cardiomyopathy (HCC)    Penile adhesions    Systolic and diastolic CHF, chronic [facility]) dx 09-18-2014   cardiologist-  dr Rachelle Hora croitoru----  ef 25-30% per last note 11-03-2014    Past Surgical History:  Procedure Laterality Date   CIRCUMCISION REVISION N/A 11/14/2014   Procedure: CIRCUMCISION REVISION, LYSIS OF ADHESIONS;  Surgeon: Su Grand, MD;  Location: [facility];  Service: Urology;  Laterality: N/A;   NO PAST SURGERIES     TRANSTHORACIC ECHOCARDIOGRAM  09-19-2014  dr croitoru   moderate LVH,  ef 35% (per dr croitoru note, ef 25-30%), diffuse hypokinesis of basal inferior myocardium,  trivial AR,  mild MR     Prior to Admission medications   Medication Sig Start Date End Date Taking? Authorizing Provider  acetaminophen (TYLENOL) 500 MG tablet Take 1,000 mg by mouth every 6 (six) hours as needed for mild pain.   Yes [provider]  amLODipine (NORVASC) 5 MG tablet Take 1 tablet (5  mg total) by mouth daily. 10/18/18  Yes Garlon Hatchet, PA-C  aspirin EC 81 MG EC tablet Take 1 tablet (81 mg total) by mouth daily. 11/15/17  Yes Tyrone Nine, MD  carvedilol (COREG) 25 MG tablet Take 1 tablet (25 mg total) by mouth 2 (two) times daily with a meal. 11/24/17  Yes Barrett, Joline Salt, PA-C  Eliquis DVT/PE Starter Pack (ELIQUIS STARTER PACK) 5 MG TABS Take as directed on package: start with two-5mg  tablets twice daily for 7 days. On day 8, switch to one-5mg  tablet twice daily. 11/19/18  Yes [place] Scrape, Alyssa B, PA-C  furosemide (LASIX) 20 MG tablet Take 1 tablet (20 mg total) by mouth daily. 11/24/17  Yes Barrett, Joline Salt, PA-C  hydrALAZINE (APRESOLINE) 25 MG tablet Take 1  tablet (25 mg total) by mouth 2 (two) times daily. 11/24/17 10/18/27 Yes Barrett, Joline Salt, PA-C  simvastatin (ZOCOR) 20 MG tablet Take 1 tablet (20 mg total) by mouth daily. 11/24/17  Yes Barrett, Joline Salt, PA-C  valsartan (DIOVAN) 160 MG tablet Take 1 tablet (160 mg total) by mouth 2 (two) times daily. 10/18/18  Yes Garlon Hatchet, PA-C  baclofen (LIORESAL) 10 MG tablet Take 0.5-1 tablets (5-10 mg total) by mouth 3 (three) times daily as needed for muscle spasms. Patient not taking: Reported on 10/18/2018 05/08/18   Arthor Captain, PA-C  spironolactone (ALDACTONE) 25 MG tablet Take 0.5 tablets (12.5 mg total) by mouth daily. 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  Social Network engineer strain: Not on file   Food insecurity    Worry: Not on file    Inability: Not on file   Transportation needs    Medical: Not on file    Non-medical: Not on file  Tobacco Use   Smoking status: Current Every Day Smoker    Packs/day: 0.50    Years: 10.00    Pack years: 5.00    Types: Cigarettes    Last attempt to quit: 04/19/2014    Years since quitting: 4.6   Smokeless tobacco: Never Used  Substance and Sexual Activity   Alcohol use: Yes    Alcohol/week: 0.0 standard drinks    Comment: 3 beers per week    Drug use: Yes    Types: Marijuana   Sexual activity: Yes  Lifestyle   Physical activity    Days per week: Not on file    Minutes per session: Not on file    Stress: Not on file  Relationships   Social connections    Talks on phone: Not on file    Gets together: Not on file    Attends religious service: Not on file    Active member of club or organization: Not on file    Attends meetings of clubs or organizations: Not on file    Relationship status: Not on file   Intimate partner violence  Fear of current or ex partner: Not on file    Emotionally abused: Not on file    Physically abused: Not on file    Forced sexual activity: Not on file  Other Topics Concern   Not on file  Social History Narrative   Not on file    Family History:   Family History  Problem Relation Age of Onset   Diabetes Mother    Hypertension Mother    Kidney disease Brother 545       on dialysis. never went to the doctor    Family Status:  Family Status  Relation Name Status   Mother  Alive   Father  Alive   Brother  Alive    ROS:  Please see the history of present illness.  All other ROS reviewed and negative.     Physical Exam/Data:   Vitals:   11/23/18 1348 11/23/18 1415 11/23/18 1500 11/23/18 1630  BP:  (!) 145/88 (!) 152/89 (!) 121/57  Pulse: (!) 101 (!) 104 95 (!) 105  Resp: (!) 30 20 16  (!) 27  Temp:      TempSrc:      SpO2: 93% 94% 97% 97%  Weight:      Height:       No intake or output data in the 24 hours ending 11/23/18 1727 Filed Weights   11/23/18 1232  Weight: (!) 141.1 kg   Body mass index is 43.38 kg/m.  General:  Well nourished, well developed, in no acute distress HEENT: normal Lymph: no adenopathy Neck: Minimal JVD Endocrine:  No thryomegaly Vascular: No carotid bruits; 4/4 extremity pulses 2+, without bruits  Cardiac:  normal S1, S2; RRR; no murmur  Lungs: Decreased breath sounds bases bilaterally, no wheezing, rhonchi or rales  Abd: soft, nontender, no hepatomegaly  Ext: no edema Musculoskeletal:  No deformities, BUE and BLE strength normal and equal Skin: warm and dry  Neuro:  CNs 2-12 intact, no  focal abnormalities noted Psych:  Normal affect   EKG:  The EKG was personally reviewed and demonstrates:  SR, LVH, HR 97  Telemetry:  Telemetry was personally reviewed and demonstrates:  SR  Relevant CV Studies:  ECHO: 11/14/2017 - Left ventricle: The cavity size was severely dilated. Wall   thickness was increased in a pattern of moderate LVH. Systolic   function was severely reduced. The estimated ejection fraction   was in the range of 20% to 25%. Diffuse hypokinesis. Features are   consistent with a pseudonormal left ventricular filling pattern,   with concomitant abnormal relaxation and increased filling   pressure (grade 2 diastolic dysfunction). - Aortic root: The aortic root was mildly dilated. - Left atrium: The atrium was moderately dilated. - Pericardium, extracardiac: A trivial pericardial effusion was   identified. Impressions: - Severe global reduction in LV systolic function; moderate LVH;   severe LVE; moderate diastolic dysfunction; moderate LAE.  LE DOPPLERS: 11/19/2018 Summary: Right: There is no evidence of a common femoral vein obstruction. Left: Findings consistent with acute deep vein thrombosis involving the left posterior tibial veins. There is no evidence of deep vein thrombosis in the lower extremity. However, portions of this examination were limited- see technologist comments above.  No cystic structure found in the popliteal fossa.  CATH: n/a  Laboratory Data:  Chemistry Recent Labs  Lab 11/19/18 1808 11/22/18 1821 11/23/18 1052  NA 137 138 138  K 3.8 3.7 3.6  CL 104 103 106  CO2 24 25 22   GLUCOSE  106* 100* 117*  BUN 21* 28* 22*  CREATININE 1.96* 1.93* 1.69*  CALCIUM 9.1 8.9 8.6*  GFRNONAA 44* 44* 52*  GFRAA 51* 52* >60  ANIONGAP 9 10 10     Lab Results  Component Value Date   ALT 39 01/10/2016   AST 24 01/10/2016   ALKPHOS 40 01/10/2016   BILITOT 0.5 01/10/2016   Hematology Recent Labs  Lab 11/22/18 1821 11/23/18 1052    WBC 8.9 7.3  RBC 5.11 4.61  HGB 14.4 13.1  HCT 43.6 39.1  MCV 85.3 84.8  MCH 28.2 28.4  MCHC 33.0 33.5  RDW 14.1 14.0  PLT 296 295   High Sensitivity Troponin:   Recent Labs  Lab 11/23/18 1052 11/23/18 1234  TROPONINIHS 285* 258*      BNP Recent Labs  Lab 11/22/18 1829 11/23/18 1052  BNP 272.0* 413.4*    TSH:  Lab Results  Component Value Date   TSH 1.127 11/18/2014   Lipids: Lab Results  Component Value Date   CHOL 174 11/18/2014   HDL 33 (L) 11/18/2014   LDLCALC 114 (H) 11/18/2014   TRIG 136 11/18/2014   CHOLHDL 5.3 11/18/2014   HgbA1c: Lab Results  Component Value Date   HGBA1C 5.6 10/10/2016   Magnesium:  Magnesium  Date Value Ref Range Status  11/19/2018 1.8 1.7 - 2.4 mg/dL Final    Comment:    Performed at [facility], [address] [place], [place] [address]     Radiology/Studies:  Ct Angio Chest Pe W And/or Wo Contrast  Result Date: 11/23/2018 CLINICAL DATA:  Mid chest pain, shortness of breath. EXAM: CT ANGIOGRAPHY CHEST WITH CONTRAST TECHNIQUE: Multidetector CT imaging of the chest was performed using the standard protocol during bolus administration of intravenous contrast. Multiplanar CT image reconstructions and MIPs were obtained to evaluate the vascular anatomy. CONTRAST:  100mL OMNIPAQUE IOHEXOL 350 MG/ML SOLN COMPARISON:  Chest radiograph 11/22/2018 FINDINGS: Cardiovascular: Enlarged heart. Small amount of fluid in the superior pericardial recess. Normal caliber of the great vessels. No evidence of pulmonary embolus to the segmental level. Mediastinum/Nodes: Mild mediastinal lymphadenopathy with the largest left prevascular lymph node measuring 18 mm in short axis. Mild bilateral hilar lymphadenopathy. Lungs/Pleura: Diffuse haziness of the lung parenchyma bilaterally. 10 mm pulmonary nodule in the right upper lobe, image 55/155, sequence 7. Upper Abdomen: No acute abnormality. Musculoskeletal: No chest wall abnormality. No acute or  significant osseous findings. Review of the MIP images confirms the above findings. IMPRESSION: 1. No evidence of pulmonary embolus. 2. Cardiomegaly. 3. Diffuse haziness of the lung parenchyma bilaterally, may represent pulmonary edema or atypical/viral pneumonia. 4. 10 mm ground-glass nodule in the right upper lobe, indeterminate. Consider one of the following in 3 months for both low-risk and high-risk individuals: (a) repeat chest CT, (b) follow-up PET-CT, or (c) tissue sampling. This recommendation follows the consensus statement: Guidelines for Management of Incidental Pulmonary Nodules Detected on CT Images: From the Fleischner Society 2017; Radiology 2017; 284:228-243. 5. Mediastinal and bilateral hilar lymphadenopathy. Electronically Signed   By: Ted Mcalpineobrinka  Dimitrova M.D.   On: 11/23/2018 13:55   Dg Chest Port 1 View  Result Date: 11/22/2018 CLINICAL DATA:  Cough EXAM: PORTABLE CHEST 1 VIEW COMPARISON:  Oct 17, 2018 FINDINGS: The heart size remains enlarged. There are scattered airspace opacities at the lung bases bilaterally. There is no pneumothorax. No large pleural effusion. No acute osseous abnormality. IMPRESSION: 1. Bibasilar airspace opacities which may represent atelectasis or infiltrate. 2. Stable cardiomegaly. Electronically  Signed   By: Constance Holster M.D.   On: 11/22/2018 18:57   Vas [place] Lower Extremity Venous (dvt) (only [facility])  Result Date: 11/20/2018  Lower Venous Study Indications: Pain. Left calf for the past 4 days  Limitations: Body habitus. 320 lbs Performing Technologist: [employer] [facility]  Examination Guidelines: A complete evaluation includes B-mode imaging, spectral Doppler, color Doppler, and power Doppler as needed of all accessible portions of each vessel. Bilateral testing is considered an integral part of a complete examination. Limited examinations for reoccurring indications may be performed as noted.   +-----+---------------+---------+-----------+----------+-------+  RIGHT Compressibility Phasicity Spontaneity Properties Summary  +-----+---------------+---------+-----------+----------+-------+  CFV   Full            Yes       Yes                             +-----+---------------+---------+-----------+----------+-------+  SFJ   Full                                                      +-----+---------------+---------+-----------+----------+-------+   +---------+---------------+---------+-----------+----------+-------------------+  LEFT      Compressibility Phasicity Spontaneity Properties Summary              +---------+---------------+---------+-----------+----------+-------------------+  CFV       Full                                                                  +---------+---------------+---------+-----------+----------+-------------------+  SFJ       Full                                                                  +---------+---------------+---------+-----------+----------+-------------------+  FV Prox   Full                                                                  +---------+---------------+---------+-----------+----------+-------------------+  FV Mid    Full                                                                  +---------+---------------+---------+-----------+----------+-------------------+  FV Distal Full                                                                  +---------+---------------+---------+-----------+----------+-------------------+  PFV       Full                                                                  +---------+---------------+---------+-----------+----------+-------------------+  POP       Full                                                                  +---------+---------------+---------+-----------+----------+-------------------+  PTV       Partial                                          There appears to be                                                               a DVT of one of the                                                              pair veins coursing                                                              from the distal leg                                                              to the distal calf   +---------+---------------+---------+-----------+----------+-------------------+  PERO                                                       Unable to visualize                                                              due to body habitus  +---------+---------------+---------+-----------+----------+-------------------+     Summary: Right:  There is no evidence of a common femoral vein obstruction. Left: Findings consistent with acute deep vein thrombosis involving the left posterior tibial veins. There is no evidence of deep vein thrombosis in the lower extremity. However, portions of this examination were limited- see technologist comments above.  No cystic structure found in the popliteal fossa.  *See table(s) above for measurements and observations. Electronically signed by Lemar Livings MD on 11/20/2018 at 12:35:42 PM.    Final     Assessment and Plan:   Principal Problem:   Acute on chronic systolic (congestive) heart failure (HCC) -Patient needs diuresis, but has improved significantly after 1 dose of Lasix in the emergency room. -Need to clarify what meds he is or is not on, and put him on a good heart failure regimen - Emphasized the importance of compliance with follow-up, he says he will do better - He feels motivated to take better care of himself because of wanting to be around for his son who is now 70 years old. -We will send in new prescriptions for valsartan, carvedilol, Lasix (increased to 40 mg a day), and hydralazine - We will send a message to the office to get him an echo - Follow-up in the office next week after the echo and get a BMET at that time. - He needs information on a low-sodium diet at  discharge  DVT: - Continue Eliquis, emphasized the need for compliance with this. -CT was negative for PE    For questions or updates, please contact [facility] Please consult [email or web address] for contact info under Cardiology/STEMI.   Signed, Theodore Demark, PA-C  11/23/2018 5:27 PM  Agree with note by Theodore Demark PA-C  33 year old moderately overweight single African-American male currently not working previous patient of Dr. Royann Shivers was with nonischemic cardiomyopathy.  EF in the 25% range.  Has not been seen in several years.  He was on carvedilol, Lasix, hydralazine and ARB which she was not taking for several months.  He recently drove to [place] last week and developed pain in his calf.  He was diagnosed with a DVT on Friday, July 3 and was placed on oral anticoagulation.  Over the last several days has had increasing shortness of breath and orthopnea.  He was given IV Lasix in the ER today and has diuresed significantly.  He is breathing better.  His exam is benign.  He is mildly tachycardic.  We talked about the importance of medication compliance and salt restriction.  Uncomfortable discharging him home from the ER with increasing dose of Lasix of 40 mg and losartan.  He will see an APP back in 7 days.  We will obtain a 2D echo and follow-up he will see Dr. Royann Shivers back in several weeks.  Runell Gess, M.D., FACP, [facility], [facility] [address] [place], [place]  [address]  [phone] 11/23/2018 6:05 PM

## 2018-11-23 NOTE — ED Triage Notes (Signed)
Pt reports being sob for the last 2 days after being diagnosed with a left leg dvt on Friday. Pt denies any chest pain. Hx of HF currently on blood thinner.

## 2018-11-23 NOTE — ED Notes (Signed)
Called lab to add on BNP. ?
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
## 2018-11-24 ENCOUNTER — Telehealth: Payer: Self-pay | Admitting: Physician Assistant

## 2018-11-24 NOTE — Telephone Encounter (Signed)
F/U Message  .           Patient wold like a call back for a echo. Pls call again

## 2018-11-25 ENCOUNTER — Telehealth: Payer: Self-pay | Admitting: Physician Assistant

## 2018-11-25 NOTE — Telephone Encounter (Signed)
Received a request to call the patient and did so.  Apparently, the call was from the schedulers wanting to schedule his echocardiogram.  Since he was seen in the emergency room, he is doing well and is cardiac stable.  I explained the reason that we wanted the echocardiogram and stated I would have the schedulers call him.  Rosaria Ferries, PA-C 11/25/2018 8:30 AM Beeper [phone]

## 2018-11-25 NOTE — Telephone Encounter (Signed)
Spoke to [facility] [place] to find available time for pt to have echo next week. Pt scheduled for echo Monday, 7/13 at 9:30 [address] office. Called pt to make sure this worked for him. Pt stated that sounds good and verbalized understanding and thanks.

## 2018-11-29 ENCOUNTER — Ambulatory Visit (HOSPITAL_COMMUNITY): Payer: Medicaid Other | Attending: Internal Medicine

## 2018-11-29 ENCOUNTER — Other Ambulatory Visit: Payer: Self-pay

## 2018-11-29 DIAGNOSIS — I5023 Acute on chronic systolic (congestive) heart failure: Secondary | ICD-10-CM | POA: Diagnosis not present

## 2018-11-29 MED ORDER — PERFLUTREN LIPID MICROSPHERE
1.0000 mL | INTRAVENOUS | Status: AC | PRN
  Administered 2018-11-29: 4 mL via INTRAVENOUS

## 2018-11-30 ENCOUNTER — Telehealth: Payer: Self-pay | Admitting: Pharmacist Clinician (PhC)/ Clinical Pharmacy Specialist

## 2018-11-30 MED ORDER — WARFARIN SODIUM 5 MG PO TABS: 5.0000 mg | ORAL_TABLET | Freq: Every day | ORAL | 0 refills | Status: DC

## 2018-11-30 NOTE — Telephone Encounter (Signed)
Pt scheduled new Coumadin appt for Friday AM. Warfarin 5mg  daily sent to pharmacy, pt aware to overlap with Eliquis through Friday due to active clot.

## 2018-11-30 NOTE — Telephone Encounter (Signed)
Pt with recent DVT (July 3) and new apical thrombus (July 7).  Was originally started on Eliquis, but will need to be transitioned to warfarin.    LMOM for patient to call back and schedule new coumadin appointment.Marland Kitchen

## 2018-12-01 ENCOUNTER — Ambulatory Visit (INDEPENDENT_AMBULATORY_CARE_PROVIDER_SITE_OTHER): Payer: Medicaid Other | Admitting: Cardiovascular Disease

## 2018-12-01 ENCOUNTER — Other Ambulatory Visit: Payer: Self-pay

## 2018-12-01 ENCOUNTER — Encounter: Payer: Self-pay | Admitting: Cardiovascular Disease

## 2018-12-01 VITALS — BP 128/87 | HR 75 | Temp 97.9°F | Ht 71.0 in | Wt 309.2 lb

## 2018-12-01 DIAGNOSIS — I428 Other cardiomyopathies: Secondary | ICD-10-CM

## 2018-12-01 DIAGNOSIS — I5023 Acute on chronic systolic (congestive) heart failure: Secondary | ICD-10-CM

## 2018-12-01 DIAGNOSIS — I513 Intracardiac thrombosis, not elsewhere classified: Secondary | ICD-10-CM

## 2018-12-01 DIAGNOSIS — I10 Essential (primary) hypertension: Secondary | ICD-10-CM

## 2018-12-01 DIAGNOSIS — N182 Chronic kidney disease, stage 2 (mild): Secondary | ICD-10-CM

## 2018-12-01 DIAGNOSIS — I240 Acute coronary thrombosis not resulting in myocardial infarction: Secondary | ICD-10-CM

## 2018-12-01 MED ORDER — ENTRESTO 49-51 MG PO TABS: 1.0000 | ORAL_TABLET | Freq: Two times a day (BID) | ORAL | 5 refills | Status: DC

## 2018-12-01 NOTE — Progress Notes (Signed)
Patient ID: Shawn [name], male   DOB: 1985/06/12, 33 y.o.   MRN: [id number]    Cardiology Office Note    Date:  12/01/2018   ID:  Shawn [name], DOB 29-Mar-1986, MRN [id number]  PCP:  Patient, No Pcp Per  Cardiologist:   Shawn Fair, MD   No chief complaint on file.   History of Present Illness:  Shawn [name] is a 33 y.o. 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  After initiation of medical therapy his echo in January 2017 showed a marked improvement in LVEF at 40-45%, but the LV was still moderately dilated. No LV clot was seen, no signs of high filling pressures.  He improved to NYHA functional class I-2.    He became less compliant with dietary restrictions and continued to smoke cigarettes.  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    On a recent trip to [place] he began developing leg pain and mild swelling and was diagnosed with a left posterior tibial DVT by ultrasound.  Eliquis was started on July 3.  He developed worsening dyspnea and came to the emergency room, improving after being administered furosemide IV.  CT angiography did not show evidence of pulmonary embolism, but an echocardiogram was then ordered and showed that LVEF had dropped back to 20-25% and he had again developed an LV apical thrombus.  The decision was made to switch from Eliquis to warfarin.  He is to start follow-up in our Coumadin clinic this Friday.  He has been out of amlodipine now for 3 weeks, but has been taking carvedilol, valsartan, furosemide and hydralazine.  Echo on 11/29/2018   1. The left ventricle has severely reduced systolic function, with an ejection fraction of 20-25%. The cavity size was severely dilated. There is moderate eccentric left ventricular hypertrophy. Left ventricular diastolic Doppler  parameters are  indeterminate.  2. LVEF is severely depressed with inferior, inferoseptal akinesis. Ther is a thrombus at the LV apex.  3. The mitral valve is abnormal. Mild thickening of the mitral valve leaflet. There is mild mitral annular calcification present.  FINDINGS  Left Ventricle: The left ventricle has severely reduced systolic function, with an ejection fraction of 20-25%. The cavity size was severely dilated. There is moderate eccentric left ventricular hypertrophy. Left ventricular diastolic Doppler parameters  are indeterminate. Definity contrast agent was given IV to delineate the left ventricular endocardial borders. LVEF is severely depressed with inferior, inferoseptal akinesis. Ther is a thrombus at the LV apex.  Past Medical History:  Diagnosis Date  . At risk for sleep apnea    STOP-BANG= 4      SENT TO PCP 11-10-2014  . CKD (chronic kidney disease), stage III (HCC)   . Dyspnea on exertion   . Hyperlipidemia   . Hypertension    at age 1  . Malignant hypertension    dx age 51  . Nonischemic cardiomyopathy (HCC)   . Penile adhesions   . Systolic and diastolic CHF, chronic (HCC) dx 09-18-2014   cardiologist-  dr Shawn [name]----  ef 25-30% per last note 11-03-2014    Past Surgical History:  Procedure Laterality Date  . CIRCUMCISION REVISION N/A 11/14/2014   Procedure: CIRCUMCISION REVISION, LYSIS OF ADHESIONS;  Surgeon: Su Grand, MD;  Location: [facility];  Service: Urology;  Laterality: N/A;  . NO  PAST SURGERIES    . TRANSTHORACIC ECHOCARDIOGRAM  09-19-2014  dr Gianah Batt   moderate LVH,  ef 35% (per dr Kanoelani Dobies note, ef 25-30%), diffuse hypokinesis of basal inferior myocardium,  trivial AR,  mild MR    Outpatient Medications Prior to Visit  Medication Sig Dispense Refill  . acetaminophen (TYLENOL) 500 MG tablet Take 1,000 mg by mouth every 6 (six) hours as needed for mild pain.    . baclofen (LIORESAL) 10 MG tablet Take 0.5-1 tablets (5-10  mg total) by mouth 3 (three) times daily as needed for muscle spasms. 30 each 0  . carvedilol (COREG) 25 MG tablet Take 1 tablet (25 mg total) by mouth 2 (two) times daily with a meal. 180 tablet 3  . furosemide (LASIX) 40 MG tablet Take 1 tablet (40 mg total) by mouth daily. 30 tablet 3  . hydrALAZINE (APRESOLINE) 25 MG tablet Take 1 tablet (25 mg total) by mouth 2 (two) times daily. 180 tablet 3  . simvastatin (ZOCOR) 20 MG tablet Take 1 tablet (20 mg total) by mouth daily. 90 tablet 3  . warfarin (COUMADIN) 5 MG tablet Take 1 tablet (5 mg total) by mouth daily. 30 tablet 0  . amLODipine (NORVASC) 5 MG tablet Take 1 tablet (5 mg total) by mouth daily. 30 tablet 0  . aspirin EC 81 MG EC tablet Take 1 tablet (81 mg total) by mouth daily. 30 tablet 0  . valsartan (DIOVAN) 160 MG tablet Take 1 tablet (160 mg total) by mouth 2 (two) times daily. 60 tablet 3   No facility-administered medications prior to visit.      Allergies:   Isosorbide nitrate and Lisinopril   Social History   Socioeconomic History  . Marital status: Single    Spouse name: Not on file  . Number of children: Not on file  . Years of education: Not on file  . Highest education level: Not on file  Occupational History  . 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 Financial resource strain: Not on file  . Food insecurity    Worry: Not on file    Inability: Not on file  . Transportation needs    Medical: Not on file    Non-medical: Not on file  Tobacco Use  . Smoking status: Current Every Day Smoker    Packs/day: 0.50    Years: 10.00    Pack years: 5.00    Types: Cigarettes    Last attempt to quit: 04/19/2014    Years since quitting: 4.6  . Smokeless tobacco: Never Used  Substance and Sexual Activity  . Alcohol use: Yes    Alcohol/week: 0.0 standard drinks    Comment: 3 beers per week   . Drug use: Yes    Types: Marijuana  . Sexual activity: Yes  Lifestyle  . Physical activity    Days per week: Not on  file    Minutes per session: Not on file  . Stress: Not on file  Relationships  . Social Herbalist on phone: Not on file    Gets together: Not on file    Attends religious service: Not on file    Active member of club or organization: Not on file    Attends meetings of clubs or organizations: Not on file    Relationship status: Not on file  Other Topics Concern  . Not on file  Social History Narrative  . Not on file     Family  History:  The patient's family history includes Diabetes in his mother; Hypertension in his mother; Kidney disease (age of onset: 7245) in his brother.   ROS:   Please see the history of present illness.    ROS All other systems reviewed and are negative.   PHYSICAL EXAM:   VS:  BP 128/87   Pulse 75   Temp 97.9 F (36.6 C)   Ht 5\' 11"  (1.803 m)   Wt (!) 309 lb 3.2 oz (140.3 kg)   SpO2 100%   BMI 43.12 kg/m      Wt Readings from Last 3 Encounters:  12/01/18 (!) 309 lb 3.2 oz (140.3 kg)  11/23/18 (!) 311 lb (141.1 kg)  11/22/18 (!) 320 lb (145.2 kg)      Studies/Labs Reviewed:  Lower extremity venous ultrasound 11/19/2018 Echocardiogram 11/29/2018 EKG:  EKG is not  ordered today.  ECG July 17 shows sinus tachycardia, left atrial enlargement, left ventricular hypertrophy, secondary repolarization abnormalities  Recent Labs: 11/19/2018: Magnesium 1.8 11/23/2018: B Natriuretic Peptide 413.4; BUN 22; Creatinine, Ser 1.69; Hemoglobin 13.1; Platelets 295; Potassium 3.6; Sodium 138   Lipid Panel    Component Value Date/Time   CHOL 174 11/18/2014 0133   TRIG 136 11/18/2014 0133   HDL 33 (L) 11/18/2014 0133   CHOLHDL 5.3 11/18/2014 0133   VLDL 27 11/18/2014 0133   LDLCALC 114 (H) 11/18/2014 0133     ASSESSMENT:    1. LV (left ventricular) mural thrombus without MI   2. Acute on chronic systolic (congestive) heart failure (HCC)   3. CKD (chronic kidney disease) stage 2, GFR 60-89 ml/min   4. Essential hypertension   5. Left  ventricular thrombus without MI   6. Morbid obesity (HCC)   7. Nonischemic cardiomyopathy (HCC)      PLAN:  In order of problems listed above:  1. CHF: Recent deterioration in left ventricular systolic function again, possibly due to less compliance with medications?  He now states that he has decided to stop smoking and is eating a much healthier diet.  Plans to be compliant with medications but has had difficulty refilling the amlodipine.  His blood pressure control is excellent and I think he would do better switching [facility] which we will increase to the maximum tolerated dose.  We will stop the amlodipine permanently.  Clinically euvolemic, denies dyspnea.  Will keep on same dose of diuretics for now.  Take another look at LVEF in 3 months, may have to discuss defibrillator is again. 2. HTN: Systolic blood pressure is excellent, ideally will get diastolic blood pressure less than 80 3. History of LV thrombus: Recurrent left ventricular thrombus.  Will probably need to stay long-term on warfarin.  Asked him to continue taking Eliquis in addition to the once daily warfarin until his follow-up visit in the Coumadin clinic on Friday. 4. Morbid besity: In the long run it is important for him to try to lose weight   Medication Adjustments/Labs and Tests Ordered: Current medicines are reviewed at length with the patient today.  Concerns regarding medicines are outlined above.  Medication changes, Labs and Tests ordered today are listed in the Patient Instructions below. Patient Instructions  Medication Instructions:  STOP the Valsartan START Entresto 49/51 mg twice daily  If you need a refill on your cardiac medications before your next appointment, please call your pharmacy.   Lab work: None ordered If you have labs (blood work) drawn today and your tests are completely normal,  you will receive your results only by: Marland Kitchen. MyChart Message (if you have MyChart) OR . A paper  copy in the mail If you have any lab test that is abnormal or we need to change your treatment, we will call you to review the results.  Testing/Procedures: Your physician has requested that you have an echocardiogram in 6 months. Echocardiography is a painless test that uses sound waves to create images of your heart. It provides your doctor with information about the size and shape of your heart and how well your heart's chambers and valves are working. You may receive an ultrasound enhancing agent through an IV if needed to better visualize your heart during the echo.This procedure takes approximately one hour. There are no restrictions for this procedure. This will take place at the [address], [address].    Follow-Up: Follow up in one month with an APP Follow up in 6 months with Dr. Royann Shiversroitoru after the echo       Signed, Shawn FairMihai Masiah Lewing, MD  12/01/2018 4:12 PM    [facility] [address], [place], [place]  [address] Phone: [phone]; Fax: [phone]

## 2018-12-01 NOTE — Patient Instructions (Addendum)
Medication Instructions:  STOP the Valsartan START Entresto 49/51 mg twice daily  If you need a refill on your cardiac medications before your next appointment, please call your pharmacy.   Lab work: None ordered If you have labs (blood work) drawn today and your tests are completely normal, you will receive your results only by: Marland Kitchen MyChart Message (if you have MyChart) OR . A paper copy in the mail If you have any lab test that is abnormal or we need to change your treatment, we will call you to review the results.  Testing/Procedures: Your physician has requested that you have an echocardiogram in 6 months. Echocardiography is a painless test that uses sound waves to create images of your heart. It provides your doctor with information about the size and shape of your heart and how well your heart's chambers and valves are working. You may receive an ultrasound enhancing agent through an IV if needed to better visualize your heart during the echo.This procedure takes approximately one hour. There are no restrictions for this procedure. This will take place at the [address].    Follow-Up: Follow up in one month with an APP Follow up in 6 months with Dr. Sallyanne Kuster after the echo

## 2018-12-03 ENCOUNTER — Other Ambulatory Visit: Payer: Self-pay

## 2018-12-03 ENCOUNTER — Ambulatory Visit (INDEPENDENT_AMBULATORY_CARE_PROVIDER_SITE_OTHER): Payer: Medicaid Other | Admitting: *Deleted

## 2018-12-03 DIAGNOSIS — I513 Intracardiac thrombosis, not elsewhere classified: Secondary | ICD-10-CM | POA: Diagnosis not present

## 2018-12-03 DIAGNOSIS — Z5181 Encounter for therapeutic drug level monitoring: Secondary | ICD-10-CM

## 2018-12-03 DIAGNOSIS — I240 Acute coronary thrombosis not resulting in myocardial infarction: Secondary | ICD-10-CM

## 2018-12-03 LAB — POCT INR: INR: 1.1 — AB (ref 2.0–3.0)

## 2018-12-03 NOTE — Patient Instructions (Signed)
Description   Overlap Eliquis and Wafarin today, Saturday and Sunday then no more Eliquis on Monday but continue taking Warfarin. Follow day to day calendar. Recheck on Friday. Call with any questions # [phone].

## 2018-12-08 ENCOUNTER — Telehealth: Payer: Self-pay

## 2018-12-08 NOTE — Telephone Encounter (Signed)

## 2018-12-10 ENCOUNTER — Other Ambulatory Visit: Payer: Self-pay

## 2018-12-10 ENCOUNTER — Ambulatory Visit (INDEPENDENT_AMBULATORY_CARE_PROVIDER_SITE_OTHER): Payer: Medicaid Other | Admitting: Pharmacist

## 2018-12-10 DIAGNOSIS — Z7901 Long term (current) use of anticoagulants: Secondary | ICD-10-CM

## 2018-12-10 DIAGNOSIS — I513 Intracardiac thrombosis, not elsewhere classified: Secondary | ICD-10-CM

## 2018-12-10 DIAGNOSIS — I240 Acute coronary thrombosis not resulting in myocardial infarction: Secondary | ICD-10-CM

## 2018-12-10 LAB — POCT INR: INR: 1.7 — AB (ref 2.0–3.0)

## 2018-12-10 MED ORDER — WARFARIN SODIUM 5 MG PO TABS: ORAL_TABLET | ORAL | 0 refills | Status: DC

## 2018-12-10 NOTE — Patient Instructions (Signed)
NO MORE ELIQUIS.  Follow day to day calendar. INCREASE dose to t 2 tablets daily except 1.5 tablets on Mondays. Recheck on Friday. Call with any questions # [phone].     Vitamin K Foods and Warfarin Warfarin is a blood thinner (anticoagulant). Anticoagulant medicines help prevent the formation of blood clots. These medicines work by decreasing the activity of vitamin K, which promotes normal blood clotting. When you take warfarin, problems can occur from suddenly increasing or decreasing the amount of vitamin K that you eat from one day to the next. Problems may include:  Blood clots.  Bleeding. What general guidelines do I need to follow? To avoid problems when taking warfarin:  Eat a balanced diet that includes: ? Fresh fruits and vegetables. ? Whole grains. ? Low-fat dairy products. ? Lean proteins, such as fish, eggs, and lean cuts of meat.  Keep your intake of vitamin K consistent from day to day. To do this: ? Avoid eating large amounts of vitamin K one day and low amounts of vitamin K the next day. ? If you take a multivitamin that contains vitamin K, be sure to take it every day. ? Know which foods contain vitamin K. Use the lists below to understand serving sizes and the amount of vitamin K in one serving.  Avoid major changes in your diet. If you are going to change your diet, talk with your health care provider before making changes.  Work with a Dealer (dietitian) to develop a meal plan that works best for you.  High vitamin K foods Foods that are high in vitamin K contain more than 100 mcg (micrograms) per serving. These include:  Broccoli (cooked) -  cup has 110 mcg.  Brussels sprouts (cooked) -  cup has 109 mcg.  Greens, beet (cooked) -  cup has 350 mcg.  Greens, collard (cooked) -  cup has 418 mcg.  Greens, turnip (cooked) -  cup has 265 mcg.  Green onions or scallions -  cup has 105 mcg.  Kale (fresh or frozen) -  cup has 531 mcg.   Parsley (raw) - 10 sprigs has 164 mcg.  Spinach (cooked) -  cup has 444 mcg.  Swiss chard (cooked) -  cup has 287 mcg. Moderate vitamin K foods Foods that have a moderate amount of vitamin K contain 25-100 mcg per serving. These include:  Asparagus (cooked) - 5 spears have 38 mcg.  Black-eyed peas (dried) -  cup has 32 mcg.  Cabbage (cooked) -  cup has 37 mcg.  Kiwi fruit - 1 medium has 31 mcg.  Lettuce - 1 cup has 57-63 mcg.  Okra (frozen) -  cup has 44 mcg.  Prunes (dried) - 5 prunes have 25 mcg.  Watercress (raw) - 1 cup has 85 mcg. Low vitamin K foods Foods low in vitamin K contain less than 25 mcg per serving. These include:  Artichoke - 1 medium has 18 mcg.  Avocado - 1 oz. has 6 mcg.  Blueberries -  cup has 14 mcg.  Cabbage (raw) -  cup has 21 mcg.  Carrots (cooked) -  cup has 11 mcg.  Cauliflower (raw) -  cup has 11 mcg.  Cucumber with peel (raw) -  cup has 9 mcg.  Grapes -  cup has 12 mcg.  Mango - 1 medium has 9 mcg.  Nuts - 1 oz. has 15 mcg.  Pear - 1 medium has 8 mcg.  Peas (cooked) -  cup has 19 mcg.  Pickles - 1 spear has 14 mcg.  Pumpkin seeds - 1 oz. has 13 mcg.  Sauerkraut (canned) -  cup has 16 mcg.  Soybeans (cooked) -  cup has 16 mcg.  Tomato (raw) - 1 medium has 10 mcg.  Tomato sauce -  cup has 17 mcg. Vitamin K-free foods If a food contain less than 5 mcg per serving, it is considered to have no vitamin K. These foods include:  Bread and cereal products.  Cheese.  Eggs.  Fish and shellfish.  Meat and poultry.  Milk and dairy products.  Sunflower seeds. Actual amounts of vitamin K in foods may be different depending on processing. Talk with your dietitian about what foods you can eat and what foods you should avoid. This information is not intended to replace advice given to you by your health care provider. Make sure you discuss any questions you have with your health care provider. Document  Released: 03/02/2009 Document Revised: 04/17/2017 Document Reviewed: 08/08/2015 Elsevier Patient Education  2020 [employer].

## 2018-12-17 ENCOUNTER — Other Ambulatory Visit: Payer: Self-pay

## 2018-12-17 ENCOUNTER — Ambulatory Visit (INDEPENDENT_AMBULATORY_CARE_PROVIDER_SITE_OTHER): Payer: Medicaid Other | Admitting: Pharmacist Clinician (PhC)/ Clinical Pharmacy Specialist

## 2018-12-17 DIAGNOSIS — I513 Intracardiac thrombosis, not elsewhere classified: Secondary | ICD-10-CM | POA: Diagnosis not present

## 2018-12-17 DIAGNOSIS — I240 Acute coronary thrombosis not resulting in myocardial infarction: Secondary | ICD-10-CM

## 2018-12-17 LAB — POCT INR: INR: 2.1 (ref 2.0–3.0)

## 2018-12-24 ENCOUNTER — Ambulatory Visit (INDEPENDENT_AMBULATORY_CARE_PROVIDER_SITE_OTHER): Payer: Medicaid Other | Admitting: Pharmacist Clinician (PhC)/ Clinical Pharmacy Specialist

## 2018-12-24 ENCOUNTER — Other Ambulatory Visit: Payer: Self-pay

## 2018-12-24 DIAGNOSIS — Z5181 Encounter for therapeutic drug level monitoring: Secondary | ICD-10-CM

## 2018-12-24 DIAGNOSIS — I513 Intracardiac thrombosis, not elsewhere classified: Secondary | ICD-10-CM | POA: Diagnosis not present

## 2018-12-24 DIAGNOSIS — Z7901 Long term (current) use of anticoagulants: Secondary | ICD-10-CM

## 2018-12-24 DIAGNOSIS — I240 Acute coronary thrombosis not resulting in myocardial infarction: Secondary | ICD-10-CM

## 2018-12-24 LAB — POCT INR: INR: 2.1 (ref 2.0–3.0)

## 2019-01-04 ENCOUNTER — Ambulatory Visit (INDEPENDENT_AMBULATORY_CARE_PROVIDER_SITE_OTHER): Payer: Medicaid Other | Admitting: Pharmacist Clinician (PhC)/ Clinical Pharmacy Specialist

## 2019-01-04 ENCOUNTER — Ambulatory Visit (INDEPENDENT_AMBULATORY_CARE_PROVIDER_SITE_OTHER): Payer: Medicaid Other | Admitting: Physician Assistant

## 2019-01-04 ENCOUNTER — Encounter: Payer: Self-pay | Admitting: Physician Assistant

## 2019-01-04 ENCOUNTER — Other Ambulatory Visit: Payer: Self-pay

## 2019-01-04 VITALS — BP 147/93 | HR 77 | Ht 71.0 in | Wt 316.6 lb

## 2019-01-04 DIAGNOSIS — I5042 Chronic combined systolic (congestive) and diastolic (congestive) heart failure: Secondary | ICD-10-CM

## 2019-01-04 DIAGNOSIS — I513 Intracardiac thrombosis, not elsewhere classified: Secondary | ICD-10-CM

## 2019-01-04 DIAGNOSIS — I428 Other cardiomyopathies: Secondary | ICD-10-CM

## 2019-01-04 DIAGNOSIS — N183 Chronic kidney disease, stage 3 unspecified: Secondary | ICD-10-CM

## 2019-01-04 DIAGNOSIS — Z8673 Personal history of transient ischemic attack (TIA), and cerebral infarction without residual deficits: Secondary | ICD-10-CM | POA: Diagnosis not present

## 2019-01-04 DIAGNOSIS — I240 Acute coronary thrombosis not resulting in myocardial infarction: Secondary | ICD-10-CM

## 2019-01-04 DIAGNOSIS — I10 Essential (primary) hypertension: Secondary | ICD-10-CM | POA: Diagnosis not present

## 2019-01-04 LAB — POCT INR: INR: 1.9 — AB (ref 2.0–3.0)

## 2019-01-04 MED ORDER — SACUBITRIL-VALSARTAN 97-103 MG PO TABS: 1.0000 | ORAL_TABLET | Freq: Two times a day (BID) | ORAL | 1 refills | Status: AC

## 2019-01-04 NOTE — Patient Instructions (Signed)
Medication Instructions:   INCREASE Entresto to 97-103 mg 2 times a day  If you need a refill on your cardiac medications before your next appointment, please call your pharmacy.   Lab work: You will need to have labs (blood work) drawn today:  BMET  If you have labs (blood work) drawn today and your tests are completely normal, you will receive your results only by: Marland Kitchen MyChart Message (if you have MyChart) OR . A paper copy in the mail If you have any lab test that is abnormal or we need to change your treatment, we will call you to review the results.  Testing/Procedures: NONE ordered at this time of appointment   Follow-Up: At [facility], you and your health needs are our priority.  As part of our continuing mission to provide you with exceptional heart care, we have created designated Provider Care Teams.  These Care Teams include your primary Cardiologist (physician) and Advanced Practice Providers (APPs -  Physician Assistants and Nurse Practitioners) who all work together to provide you with the care you need, when you need it. You will need a follow up appointment in 1 months with Shawn Deforest, PA-C  Any Other Special Instructions Will Be Listed Below (If Applicable).

## 2019-01-04 NOTE — Progress Notes (Signed)
Cardiology Office Note    Date:  01/05/2019   ID:  Shawn [name], DOB 04/08/1986, MRN [id number]  PCP:  Patient, No Pcp Per  Cardiologist: Dr. Sallyanne Kuster  Chief Complaint  Patient presents with  . Follow-up    seen for Dr. Sallyanne Kuster    History of Present Illness:  Shawn [name] is a 33 y.o. 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  EF improved to 40 to 45% by January 2017.  No LV thrombus was seen at the time.  He was diagnosed with left posterior tibial DVT by ultrasound on recent trip to [place] in July.  Eliquis was started on 12/16/2018.  He later developed worsening dyspnea and came.  ED.  His symptom improved after administration of IV Lasix.  CT angiogram of the chest showed no evidence of PE.  Echocardiogram was ordered, unfortunately EF went down to 20 to 25% and there was also presence of LV thrombus again.  The decision was later to switch Eliquis to Coumadin.  Last seen by Dr. Sallyanne Kuster on 12/01/2018, his valsartan was switched to Entresto 49/51 mg twice daily.  A repeat echocardiogram was recommended in 6 months.  Patient presents today for cardiology office visit.  He has taken all of his medications 2-hour prior to his visit, blood pressure is elevated at 147/93.  This is unchanged using manual repeat.  The elevated blood pressure did not make much sense when compared to the previous blood pressure on 12/01/2018 especially after changing valsartan to Entresto.  I recommended increase Entresto further to 97-103 mg twice daily.  He will need a basic metabolic panel today.  I plan to see the patient back in 1 month for reevaluation.  I am unable to add nitrates due to intolerance.  He will continue on carvedilol, hydralazine and Entresto.  If blood pressure is stable on the next follow-up, we will arrange for the repeat  echocardiogram.   Past Medical History:  Diagnosis Date  . At risk for sleep apnea    STOP-BANG= 4      SENT TO PCP 11-10-2014  . CKD (chronic kidney disease), stage III ([place])   . Dyspnea on exertion   . Hyperlipidemia   . Hypertension    at age 86  . Malignant hypertension    dx age 61  . Nonischemic cardiomyopathy ([place])   . Penile adhesions   . Systolic and diastolic CHF, chronic ([place]) dx 09-18-2014   cardiologist-  dr Dani Gobble croitoru----  ef 25-30% per last note 11-03-2014    Past Surgical History:  Procedure Laterality Date  . CIRCUMCISION REVISION N/A 11/14/2014   Procedure: CIRCUMCISION REVISION, LYSIS OF ADHESIONS;  Surgeon: Lowella Bandy, MD;  Location: [facility];  Service: Urology;  Laterality: N/A;  . NO PAST SURGERIES    . TRANSTHORACIC ECHOCARDIOGRAM  09-19-2014  dr croitoru   moderate LVH,  ef 35% (per dr croitoru note, ef 25-30%), diffuse hypokinesis of basal inferior myocardium,  trivial AR,  mild MR    Current Medications: Outpatient Medications Prior to Visit  Medication Sig Dispense Refill  . acetaminophen (TYLENOL) 500 MG tablet Take 1,000 mg by mouth every 6 (six) hours as needed for mild pain.    . baclofen (LIORESAL) 10 MG tablet Take 0.5-1 tablets (5-10 mg total) by mouth 3 (three) times daily as needed for muscle  spasms. 30 each 0  . carvedilol (COREG) 25 MG tablet Take 1 tablet (25 mg total) by mouth 2 (two) times daily with a meal. 180 tablet 3  . furosemide (LASIX) 40 MG tablet Take 1 tablet (40 mg total) by mouth daily. 30 tablet 3  . hydrALAZINE (APRESOLINE) 25 MG tablet Take 1 tablet (25 mg total) by mouth 2 (two) times daily. 180 tablet 3  . simvastatin (ZOCOR) 20 MG tablet Take 1 tablet (20 mg total) by mouth daily. 90 tablet 3  . warfarin (COUMADIN) 5 MG tablet Take 1 and 1/2 to 2 tablets daily as directed by coumadin clinic 60 tablet 0  . sacubitril-valsartan (ENTRESTO) 49-51 MG Take 1 tablet by mouth 2 (two) times daily. 60 tablet 5    No facility-administered medications prior to visit.      Allergies:   Isosorbide nitrate and Lisinopril   Social History   Socioeconomic History  . Marital status: Single    Spouse name: Not on file  . Number of children: Not on file  . Years of education: Not on file  . Highest education level: Not on file  Occupational History  . 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 Financial resource strain: Not on file  . Food insecurity    Worry: Not on file    Inability: Not on file  . Transportation needs    Medical: Not on file    Non-medical: Not on file  Tobacco Use  . Smoking status: Current Every Day Smoker    Packs/day: 0.50    Years: 10.00    Pack years: 5.00    Types: Cigarettes    Last attempt to quit: 04/19/2014    Years since quitting: 4.7  . Smokeless tobacco: Never Used  Substance and Sexual Activity  . Alcohol use: Yes    Alcohol/week: 0.0 standard drinks    Comment: 3 beers per week   . Drug use: Yes    Types: Marijuana  . Sexual activity: Yes  Lifestyle  . Physical activity    Days per week: Not on file    Minutes per session: Not on file  . Stress: Not on file  Relationships  . Social Musicianconnections    Talks on phone: Not on file    Gets together: Not on file    Attends religious service: Not on file    Active member of club or organization: Not on file    Attends meetings of clubs or organizations: Not on file    Relationship status: Not on file  Other Topics Concern  . Not on file  Social History Narrative  . Not on file     Family History:  The patient's family history includes Diabetes in his mother; Hypertension in his mother; Kidney disease (age of onset: 6345) in his brother.   ROS:   Please see the history of present illness.    ROS All other systems reviewed and are negative.   PHYSICAL EXAM:   VS:  BP (!) 147/93   Pulse 77   Ht 5\' 11"  (1.803 m)   Wt (!) 316 lb 9.6 oz (143.6 kg)   SpO2 98%   BMI 44.16 kg/m    GEN: Well  nourished, well developed, in no acute distress  HEENT: normal  Neck: no JVD, carotid bruits, or masses Cardiac: RRR; no murmurs, rubs, or gallops,no edema  Respiratory:  clear to auscultation bilaterally, normal work of breathing GI: soft, nontender, nondistended, + BS  MS: no deformity or atrophy  Skin: warm and dry, no rash Neuro:  Alert and Oriented x 3, Strength and sensation are intact Psych: euthymic mood, full affect  Wt Readings from Last 3 Encounters:  01/04/19 (!) 316 lb 9.6 oz (143.6 kg)  12/01/18 (!) 309 lb 3.2 oz (140.3 kg)  11/23/18 (!) 311 lb (141.1 kg)      Studies/Labs Reviewed:   EKG:  EKG is not ordered today.   Recent Labs: 11/19/2018: Magnesium 1.8 11/23/2018: B Natriuretic Peptide 413.4; Hemoglobin 13.1; Platelets 295 01/04/2019: BUN 26; Creatinine, Ser 1.80; Potassium 4.3; Sodium 141   Lipid Panel    Component Value Date/Time   CHOL 174 11/18/2014 0133   TRIG 136 11/18/2014 0133   HDL 33 (L) 11/18/2014 0133   CHOLHDL 5.3 11/18/2014 0133   VLDL 27 11/18/2014 0133   LDLCALC 114 (H) 11/18/2014 0133    Additional studies/ records that were reviewed today include:   Echo 11/29/2018 1. The left ventricle has severely reduced systolic function, with an ejection fraction of 20-25%. The cavity size was severely dilated. There is moderate eccentric left ventricular hypertrophy. Left ventricular diastolic Doppler parameters are  indeterminate.  2. LVEF is severely depressed with inferior, inferoseptal akinesis. Ther is a thrombus at the LV apex.  3. The mitral valve is abnormal. Mild thickening of the mitral valve leaflet. There is mild mitral annular calcification present.    ASSESSMENT:    1. Essential hypertension   2. Stage 3 chronic kidney disease (HCC)   3. Nonischemic cardiomyopathy (HCC)   4. Chronic combined systolic and diastolic CHF (congestive heart failure) (HCC)   5. H/O: CVA (cerebrovascular accident)   6. Morbid obesity (HCC)      PLAN:   In order of problems listed above:  1. Hypertension: Blood pressure continue to be elevated despite switching valsartan to Entresto.  I will increase Entresto to 97-103 mg twice daily.  2. Chronic combined systolic and diastolic heart failure: Euvolemic on physical exam  3. Nonischemic cardiomyopathy: Recent echocardiogram shows EF 20 to 25%.  We will continue to uptitrate heart failure medication.  Repeat echocardiogram in 3 months, if EF is still less than 35%, will need to be evaluated for ICD  4. History of CVA: Due to LV thrombus.  5. Morbid obesity: Weight loss is imperative to help control high blood pressure.  6. CKD stage III: Obtain basic metabolic panel    Medication Adjustments/Labs and Tests Ordered: Current medicines are reviewed at length with the patient today.  Concerns regarding medicines are outlined above.  Medication changes, Labs and Tests ordered today are listed in the Patient Instructions below. Patient Instructions  Medication Instructions:   INCREASE Entresto to 97-103 mg 2 times a day  If you need a refill on your cardiac medications before your next appointment, please call your pharmacy.   Lab work: You will need to have labs (blood work) drawn today:  BMET  If you have labs (blood work) drawn today and your tests are completely normal, you will receive your results only by: Marland Kitchen MyChart Message (if you have MyChart) OR . A paper copy in the mail If you have any lab test that is abnormal or we need to change your treatment, we will call you to review the results.  Testing/Procedures: NONE ordered at this time of appointment   Follow-Up: At [facility], you and your health needs are our priority.  As part of our continuing mission to provide you with  exceptional heart care, we have created designated Provider Care Teams.  These Care Teams include your primary Cardiologist (physician) and Advanced Practice Providers (APPs -  Physician Assistants  and Nurse Practitioners) who all work together to provide you with the care you need, when you need it. You will need a follow up appointment in 1 months with Azalee CourseHao Baila Rouse, PA-C  Any Other Special Instructions Will Be Listed Below (If Applicable).       Ramond DialSigned, Sybil Shrader, [place]  01/05/2019 11:35 PM    [facility] [name], [place], [place]  [address] Phone: [phone]; Fax: [phone]

## 2019-01-05 ENCOUNTER — Encounter: Payer: Self-pay | Admitting: Physician Assistant

## 2019-01-05 LAB — BASIC METABOLIC PANEL
BUN/Creatinine Ratio: 14 (ref 9–20)
BUN: 26 mg/dL — ABNORMAL HIGH (ref 6–20)
CO2: 22 mmol/L (ref 20–29)
Calcium: 9 mg/dL (ref 8.7–10.2)
Chloride: 104 mmol/L (ref 96–106)
Creatinine, Ser: 1.8 mg/dL — ABNORMAL HIGH (ref 0.76–1.27)
GFR calc Af Amer: 56 mL/min/{1.73_m2} — ABNORMAL LOW (ref >59)
GFR calc non Af Amer: 48 mL/min/{1.73_m2} — ABNORMAL LOW (ref >59)
Glucose: 87 mg/dL (ref 65–99)
Potassium: 4.3 mmol/L (ref 3.5–5.2)
Sodium: 141 mmol/L (ref 134–144)

## 2019-01-10 ENCOUNTER — Other Ambulatory Visit: Payer: Self-pay | Admitting: Cardiovascular Disease

## 2019-01-11 NOTE — Telephone Encounter (Signed)
Please review for refill. Thank you! 

## 2019-01-12 ENCOUNTER — Other Ambulatory Visit: Payer: Self-pay

## 2019-01-12 MED ORDER — WARFARIN SODIUM 5 MG PO TABS: ORAL_TABLET | ORAL | 2 refills | Status: DC

## 2019-01-20 ENCOUNTER — Ambulatory Visit (INDEPENDENT_AMBULATORY_CARE_PROVIDER_SITE_OTHER): Payer: Medicaid Other | Admitting: Pharmacist Clinician (PhC)/ Clinical Pharmacy Specialist

## 2019-01-20 ENCOUNTER — Other Ambulatory Visit: Payer: Self-pay

## 2019-01-20 DIAGNOSIS — I240 Acute coronary thrombosis not resulting in myocardial infarction: Secondary | ICD-10-CM

## 2019-01-20 DIAGNOSIS — I513 Intracardiac thrombosis, not elsewhere classified: Secondary | ICD-10-CM

## 2019-01-20 LAB — POCT INR: INR: 2.8 (ref 2.0–3.0)

## 2019-02-02 ENCOUNTER — Telehealth: Payer: Self-pay | Admitting: *Deleted

## 2019-02-02 NOTE — Telephone Encounter (Signed)
Prior authorization was completed vis [employer] phone call. KEY: [id number].

## 2019-02-15 ENCOUNTER — Ambulatory Visit (INDEPENDENT_AMBULATORY_CARE_PROVIDER_SITE_OTHER): Payer: Medicaid Other | Admitting: General Practice

## 2019-02-15 ENCOUNTER — Other Ambulatory Visit: Payer: Self-pay

## 2019-02-15 ENCOUNTER — Encounter: Payer: Self-pay | Admitting: Physician Assistant

## 2019-02-15 ENCOUNTER — Ambulatory Visit (INDEPENDENT_AMBULATORY_CARE_PROVIDER_SITE_OTHER): Payer: Medicaid Other | Admitting: Pharmacist Clinician (PhC)/ Clinical Pharmacy Specialist

## 2019-02-15 VITALS — BP 153/95 | HR 85 | Temp 94.5°F | Ht 71.0 in | Wt 321.0 lb

## 2019-02-15 DIAGNOSIS — I240 Acute coronary thrombosis not resulting in myocardial infarction: Secondary | ICD-10-CM

## 2019-02-15 DIAGNOSIS — N183 Chronic kidney disease, stage 3 unspecified: Secondary | ICD-10-CM

## 2019-02-15 DIAGNOSIS — I10 Essential (primary) hypertension: Secondary | ICD-10-CM

## 2019-02-15 DIAGNOSIS — I513 Intracardiac thrombosis, not elsewhere classified: Secondary | ICD-10-CM

## 2019-02-15 DIAGNOSIS — I5042 Chronic combined systolic (congestive) and diastolic (congestive) heart failure: Secondary | ICD-10-CM | POA: Diagnosis not present

## 2019-02-15 DIAGNOSIS — Z8673 Personal history of transient ischemic attack (TIA), and cerebral infarction without residual deficits: Secondary | ICD-10-CM

## 2019-02-15 DIAGNOSIS — I428 Other cardiomyopathies: Secondary | ICD-10-CM | POA: Diagnosis not present

## 2019-02-15 LAB — POCT INR: INR: 1.9 — AB (ref 2.0–3.0)

## 2019-02-15 MED ORDER — VALSARTAN 160 MG PO TABS: 160.0000 mg | ORAL_TABLET | Freq: Two times a day (BID) | ORAL | 3 refills | Status: DC

## 2019-02-15 NOTE — Patient Instructions (Addendum)
Medication Instructions:  Your physician recommends that you continue on your current medications as directed. Please refer to the Current Medication list given to you today.  If you need a refill on your cardiac medications before your next appointment, please call your pharmacy.   Lab work: NONE ordered at this time of appointment   If you have labs (blood work) drawn today and your tests are completely normal, you will receive your results only by: Marland Kitchen MyChart Message (if you have MyChart) OR . A paper copy in the mail If you have any lab test that is abnormal or we need to change your treatment, we will call you to review the results.  Testing/Procedures: NONE ordered at this time of appointment   Follow-Up: At [facility], you and your health needs are our priority.  As part of our continuing mission to provide you with exceptional heart care, we have created designated Provider Care Teams.  These Care Teams include your primary Cardiologist (physician) and Advanced Practice Providers (APPs -  Physician Assistants and Nurse Practitioners) who all work together to provide you with the care you need, when you need it. . Your physician recommends that you schedule a follow-up appointment in 2-3 weeks with Coletta Memos, NP on an APPOD day  Any Other Special Instructions Will Be Listed Below (If Applicable).

## 2019-02-15 NOTE — Progress Notes (Signed)
Cardiology Clinic Note   Patient Name: Shawn [name] Date of Encounter: 02/15/2019  Primary Care Provider:  Patient, No Pcp Per Primary Cardiologist:  Thurmon Fair, MD  Patient Profile    Shawn [name]. Shawn [name] 33 year old male presents today for follow-up of his chronic combined systolic and diastolic CHF, hypertension, nonischemic cardiomyopathy and hypertension.  Past Medical History    Past Medical History:  Diagnosis Date   At risk for sleep apnea    STOP-BANG= 4      SENT TO PCP 11-10-2014   CKD (chronic kidney disease), stage III (HCC)    Dyspnea on exertion    Hyperlipidemia    Hypertension    at age 43   Malignant hypertension    dx age 35   Nonischemic cardiomyopathy (HCC)    Penile adhesions    Systolic and diastolic CHF, chronic (HCC) dx 09-18-2014   cardiologist-  dr Rachelle Hora croitoru----  ef 25-30% per last note 11-03-2014   Past Surgical History:  Procedure Laterality Date   CIRCUMCISION REVISION N/A 11/14/2014   Procedure: CIRCUMCISION REVISION, LYSIS OF ADHESIONS;  Surgeon: Su Grand, MD;  Location: [facility];  Service: Urology;  Laterality: N/A;   NO PAST SURGERIES     TRANSTHORACIC ECHOCARDIOGRAM  09-19-2014  dr croitoru   moderate LVH,  ef 35% (per dr croitoru note, ef 25-30%), diffuse hypokinesis of basal inferior myocardium,  trivial AR,  mild MR    Allergies  Allergies  Allergen Reactions   Isosorbide Nitrate Other (See Comments)    headaches   Lisinopril Cough    History of Present Illness    Shawn [name] was last seen on 01/04/2019 by Azalee Course PA-C.  During that time he was hypertensive and his Sherryll Burger was increased to 97-103 twice daily, and a BMP was drawn.  With a plan for a repeat echocardiogram if his blood pressure was stable.  His past medical history includes hypertensive cardiomyopathy, chronic systolic and diastolic heart failure, CVA related to left ventricular thrombus, CKD stage II-III, DVT and  morbid obesity.  His LVEF had improved to 40 to 45% by January 2017 and no LV thrombus was seen at that time.  In July 2020 he was diagnosed with a posterior left tibial DVT during a trip to [place].  Eliquis was started on 12/16/2018.  He later developed worsening shortness of breath and came to the emergency department.  His symptoms did however improve with IV Lasix.  A CT angios of his chest showed no PE.  An echocardiogram during that time showed an LVEF that had decreased to 20 to 25% with the presence of a repeat LV thrombus.  His Eliquis was switched to Coumadin and on 12/01/2018 his valsartan was switched to Entresto 49-51 mg twice daily.  He presents to the clinic today and states he ran out of his Sherryll Burger 49-51 medication his insurance would not approve his higher dose Entresto. He elected to start back on valsartan 160 an hour before his clinic visit.  He states he was not taking his Entresto or Valsartan for around 3 weeks or so.  He states he continues to keep track of his Coumadin.  I will bring him back in 2 to 3 weeks to recheck his BMP plan for repeat echocardiogram in 2 months.  He denies chest pain, shortness of breath, lower extremity edema, fatigue, palpitations, melena, hematuria, hemoptysis, diaphoresis, weakness, presyncope, syncope, orthopnea, and PND.  Home Medications    Prior to Admission medications  Medication Sig Start Date End Date Taking? Authorizing Provider  acetaminophen (TYLENOL) 500 MG tablet Take 1,000 mg by mouth every 6 (six) hours as needed for mild pain.    [provider]  baclofen (LIORESAL) 10 MG tablet Take 0.5-1 tablets (5-10 mg total) by mouth 3 (three) times daily as needed for muscle spasms. 05/08/18   Margarita Mail, PA-C  carvedilol (COREG) 25 MG tablet Take 1 tablet (25 mg total) by mouth 2 (two) times daily with a meal. 11/23/18   Barrett, Evelene Croon, PA-C  furosemide (LASIX) 40 MG tablet Take 1 tablet (40 mg total) by mouth daily.  11/23/18   Barrett, Evelene Croon, PA-C  hydrALAZINE (APRESOLINE) 25 MG tablet Take 1 tablet (25 mg total) by mouth 2 (two) times daily. 11/23/18 11/18/19  Barrett, Evelene Croon, PA-C  simvastatin (ZOCOR) 20 MG tablet Take 1 tablet (20 mg total) by mouth daily. 11/24/17   Barrett, Evelene Croon, PA-C  warfarin (COUMADIN) 5 MG tablet TAKE 1 TABLET(5 MG) BY MOUTH DAILY 01/12/19   Croitoru, Mihai, MD    Family History    Family History  Problem Relation Age of Onset   Diabetes Mother    Hypertension Mother    Kidney disease Brother 10       on dialysis. never went to the doctor    He indicated that his mother is alive. He indicated that his father is alive. He indicated that his brother is alive.  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  Social Designer, fashion/clothing strain: Not on file   Food insecurity    Worry: Not on file    Inability: Not on file   Transportation needs    Medical: Not on file    Non-medical: Not on file  Tobacco Use   Smoking status: Current Every Day Smoker    Packs/day: 0.50    Years: 10.00    Pack years: 5.00    Types: Cigarettes    Last attempt to quit: 04/19/2014    Years since quitting: 4.8   Smokeless tobacco: Never Used  Substance and Sexual Activity   Alcohol use: Yes    Alcohol/week: 0.0 standard drinks    Comment: 3 beers per week    Drug use: Yes    Types: Marijuana   Sexual activity: Yes  Lifestyle   Physical activity    Days per week: Not on file    Minutes per session: Not on file   Stress: Not on file  Relationships   Social connections    Talks on phone: Not on file    Gets together: Not on file    Attends religious service: Not on file    Active member of club or organization: Not on file     Attends meetings of clubs or organizations: Not on file    Relationship status: Not on file   Intimate partner violence    Fear of current or ex partner: Not on file    Emotionally abused: Not on file    Physically abused: Not on file    Forced sexual activity: Not on file  Other Topics Concern   Not on file  Social History Narrative  Not on file     Review of Systems    General:  No chills, fever, night sweats or weight changes.  Cardiovascular:  No chest pain, dyspnea on exertion, edema, orthopnea, palpitations, paroxysmal nocturnal dyspnea. Dermatological: No rash, lesions/masses Respiratory: No cough, dyspnea Urologic: No hematuria, dysuria Abdominal:   No nausea, vomiting, diarrhea, bright red blood per rectum, melena, or hematemesis Neurologic:  No visual changes, wkns, changes in mental status. All other systems reviewed and are otherwise negative except as noted above.  Physical Exam    VS:  BP (!) 153/95    Pulse 85    Temp (!) 94.5 F (34.7 C)    Ht 5\' 11"  (1.803 m)    Wt (!) 321 lb (145.6 kg)    SpO2 95%    BMI 44.77 kg/m  , BMI Body mass index is 44.77 kg/m. GEN: Well nourished, well developed, in no acute distress. HEENT: normal. Neck: Supple, no JVD, carotid bruits, or masses. Cardiac: RRR, no murmurs, rubs, or gallops. No clubbing, cyanosis, edema.  Radials/DP/PT 2+ and equal bilaterally.  Respiratory:  Respirations regular and unlabored, clear to auscultation bilaterally. GI: Soft, nontender, nondistended, BS + x 4. MS: no deformity or atrophy. Skin: warm and dry, no rash. Neuro:  Strength and sensation are intact. Psych: Normal affect.  Accessory Clinical Findings    ECG personally reviewed by me today-none today  EKG 11/24/2018 Sinus rhythm with occasional premature ventricular complexes 93 bpm  Echocardiogram 11/29/2018 IMPRESSIONS    1. The left ventricle has severely reduced systolic function, with an ejection fraction of 20-25%. The cavity  size was severely dilated. There is moderate eccentric left ventricular hypertrophy. Left ventricular diastolic Doppler parameters are  indeterminate.  2. LVEF is severely depressed with inferior, inferoseptal akinesis. Ther is a thrombus at the LV apex.  3. The mitral valve is abnormal. Mild thickening of the mitral valve leaflet. There is mild mitral annular calcification present.  Assessment & Plan   Essential hypertension BP today 153/95-medication compliance was stressed and are contact information was given to him at time of visit so that he may call 12/01/2018 for assistance if needed. Heart healthy low-sodium diet-salty 6 given Increase physical activity as tolerated Continue valsartan 160 mg twice daily- of Entresto, stated his insurance would not cover the medication.  Our prior authorization was denied.  Case discussed with [facility] pharmacist. Continue carvedilol 25 mg  twice daily  Nonischemic cardiomyopathy- echocardiogram from 11/29/2018 showed an LVEF of 20 to 25%.  Repeat echocardiogram in 2 month  Chronic combined systolic and diastolic CHF- no shortness of breath today euvolemic Continue valsartan 160 mg twice daily Continue carvedilol 25 mg  twice daily Case discussed with [facility] pharmacist.  Stage III chronic kidney disease- last creatinine 1.8 on 01/04/2019, BUN 26 (baseline)  repeat BMP in 2 weeks  History of CVA- due to LV thrombus Continue Coumadin 5 mg tablet daily  Morbid obesity-weight today 321pounds patient states that he is starting a new diet and exercise program this week. Continue weight loss goals Heart healthy low-sodium diet Increase physical activity as tolerated  Disposition: Follow-up with APP in 2-3 weeks.  01/06/2019, NP-C 02/15/2019, 3:00 PM

## 2019-02-21 ENCOUNTER — Other Ambulatory Visit: Payer: Self-pay

## 2019-02-24 ENCOUNTER — Telehealth: Payer: Self-pay | Admitting: *Deleted

## 2019-02-24 ENCOUNTER — Other Ambulatory Visit: Payer: Self-pay

## 2019-02-24 DIAGNOSIS — I5043 Acute on chronic combined systolic (congestive) and diastolic (congestive) heart failure: Secondary | ICD-10-CM

## 2019-02-24 MED ORDER — FUROSEMIDE 40 MG PO TABS: 40.0000 mg | ORAL_TABLET | Freq: Every day | ORAL | 3 refills | Status: DC

## 2019-02-24 NOTE — Telephone Encounter (Signed)
Prior authorization for [name] has been resubmitted to [employer] portal. Code: [id number]

## 2019-03-01 NOTE — Telephone Encounter (Signed)
The prior authorization has been approved for Entresto from 02/24/2019-02/24/2020 through [place].

## 2019-03-02 MED ORDER — WARFARIN SODIUM 5 MG PO TABS: ORAL_TABLET | ORAL | 0 refills | Status: DC

## 2019-03-02 MED ORDER — ENTRESTO 49-51 MG PO TABS: 1.0000 | ORAL_TABLET | Freq: Two times a day (BID) | ORAL | 0 refills | Status: DC

## 2019-03-02 MED ORDER — FUROSEMIDE 40 MG PO TABS: 40.0000 mg | ORAL_TABLET | Freq: Every day | ORAL | 0 refills | Status: DC

## 2019-03-02 NOTE — Telephone Encounter (Signed)
Please call Mr. Shawn [name] and let him know that his [name] medication has been approved. Have start taking the 49-51 Entresto and stop his valsartan. He needs to keep his follow up appt with [place] on 03/11/2019. Thank you

## 2019-03-02 NOTE — Telephone Encounter (Signed)
PT NOTIFIED OF STARTING ENTRESTO. PT STATES THAT HE NEEDS REFILL OF LASIX AND WARFARIN. PT STATES THAT HE "HAS TO TAKE" LASIX 40MG  BID, NOT QD BECAUSE HE WAS IN THE IN July FOR HAVING FLUID IN HIS LUNGS AND THEY TOLD HIM THAT THIS WAS BECAUSE HE WAS TAKING ONLY ONCE DAILY AND TO ALWAYS TAKE BID. DO YOU WANT TO REFILL LASIX 40MG  BID? HE DOES NOT HAVE ENOUGH TO LAST UNTIL HIS APPOINTMENT AND HAS BEEN TAKING BID. CREATININE IN AUG WAS 1.8. OK TO REFILL?

## 2019-03-02 NOTE — Telephone Encounter (Signed)
Per Coletta Memos, NP instant message: ok, he needs the 49-51dose, yes refill his lasix and warfarin, ok, so it looks like Gwenlyn Found took him off the 20 mg lasix when he was at the hospital in July, and put him on 40 mg. So maybe thats where the confusion is coming from, but he should only be taking 40 mg a day, send him the salty six sheet and advise him to stay away from salt. yes, tell him that it is ok to take lasix 40 mg daily and make sure he is weighing daily. Maybe explain to him to that we dont want him to take extra lasix bc it could hurt his kidneys.  Left detailed message for pt to call back for further details as above. Refills sent. Salty six mailed. Will await call back from pt.

## 2019-03-11 ENCOUNTER — Other Ambulatory Visit: Payer: Self-pay

## 2019-03-11 ENCOUNTER — Encounter: Payer: Self-pay | Admitting: Cardiology

## 2019-03-11 ENCOUNTER — Ambulatory Visit (INDEPENDENT_AMBULATORY_CARE_PROVIDER_SITE_OTHER): Payer: Medicaid Other | Admitting: Cardiology

## 2019-03-11 VITALS — BP 122/84 | HR 61 | Temp 97.1°F | Ht 71.0 in | Wt 325.0 lb

## 2019-03-11 DIAGNOSIS — I110 Hypertensive heart disease with heart failure: Secondary | ICD-10-CM

## 2019-03-11 DIAGNOSIS — I5043 Acute on chronic combined systolic (congestive) and diastolic (congestive) heart failure: Secondary | ICD-10-CM | POA: Diagnosis not present

## 2019-03-11 DIAGNOSIS — Z86718 Personal history of other venous thrombosis and embolism: Secondary | ICD-10-CM | POA: Diagnosis not present

## 2019-03-11 DIAGNOSIS — Z8673 Personal history of transient ischemic attack (TIA), and cerebral infarction without residual deficits: Secondary | ICD-10-CM

## 2019-03-11 DIAGNOSIS — I5042 Chronic combined systolic (congestive) and diastolic (congestive) heart failure: Secondary | ICD-10-CM | POA: Diagnosis not present

## 2019-03-11 DIAGNOSIS — N182 Chronic kidney disease, stage 2 (mild): Secondary | ICD-10-CM | POA: Diagnosis not present

## 2019-03-11 DIAGNOSIS — I240 Acute coronary thrombosis not resulting in myocardial infarction: Secondary | ICD-10-CM

## 2019-03-11 DIAGNOSIS — I428 Other cardiomyopathies: Secondary | ICD-10-CM | POA: Diagnosis not present

## 2019-03-11 MED ORDER — FUROSEMIDE 40 MG PO TABS: 40.0000 mg | ORAL_TABLET | Freq: Every day | ORAL | 3 refills | Status: DC

## 2019-03-11 NOTE — Patient Instructions (Signed)
Medication Instructions:  Your physician recommends that you continue on your current medications as directed. Please refer to the Current Medication list given to you today. *If you need a refill on your cardiac medications before your next appointment, please call your pharmacy*  Lab Work: None  If you have labs (blood work) drawn today and your tests are completely normal, you will receive your results only by: . MyChart Message (if you have MyChart) OR . A paper copy in the mail If you have any lab test that is abnormal or we need to change your treatment, we will call you to review the results.  Testing/Procedures: None   Follow-Up: At [facility], you and your health needs are our priority.  As part of our continuing mission to provide you with exceptional heart care, we have created designated Provider Care Teams.  These Care Teams include your primary Cardiologist (physician) and Advanced Practice Providers (APPs -  Physician Assistants and Nurse Practitioners) who all work together to provide you with the care you need, when you need it.  Your next appointment:   6 month(s)  The format for your next appointment:   In Person  Provider:   Mihai Croitoru, MD  Other Instructions  

## 2019-03-13 MED ORDER — WARFARIN SODIUM 5 MG PO TABS: ORAL_TABLET | ORAL | 0 refills | Status: DC

## 2019-03-15 NOTE — Assessment & Plan Note (Signed)
EF 20% in 2016, improved to 40% on medications in 2017 but has been off and on compliant. His EF in 2019 was again 20% after he stopped medications.  His EF in July 2020 was 20% when he presented in CHF, off medications.

## 2019-03-15 NOTE — Assessment & Plan Note (Signed)
BMI 44 

## 2019-03-15 NOTE — Assessment & Plan Note (Signed)
Most recent echo July 2020 showed an EF of 20% with LVT-

## 2019-03-15 NOTE — Assessment & Plan Note (Signed)
LVT and CVA in 2016- recurrent LVT on echo July 2020- Coumadin Rx started.

## 2019-03-15 NOTE — Assessment & Plan Note (Signed)
GFR 56 

## 2019-03-15 NOTE — Assessment & Plan Note (Signed)
Eliquis changed to Coumadin July 2020 when he was noted to have LVT while in [facility]

## 2019-03-15 NOTE — Assessment & Plan Note (Signed)
Embolic stroke 8110- LVT on echo-treated with anticoagulation for 12 months

## 2019-03-15 NOTE — Progress Notes (Signed)
Cardiology Office Note:    Date:  03/11/2019   ID:  Shawn [name], DOB February 05, 1986, MRN [id number]  PCP:  Patient, No Pcp Per  Cardiologist:  Sanda Klein, MD  Electrophysiologist:  None   Referring MD: No ref. provider found   Chief Complaint  Patient presents with  . 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  He was seen 03/11/2019 for B/P follow up.  The patient has a history of NICM.  His EF in 2016 was 30-35%.  He presented with CVA secondary to LV thrombus.  His EF improved to 40-45% by Jan 2017.   This past July he was diagnosed with a DVT in [facility]. Eliquis was added.  He was seen in the ED here shortly thereafter with SOB.  His EF was noted to be 20-25% with LVT.  It was noted that he had not been on his medication prior to this. Eliquis was changed to Coumadin and Entresto added. We have been following him closely as an Op since.  Despite this he ran out of [place] and was put back on Valsartan secondary to cost on 02/15/2019.   I saw the patient 03/11/2019.  He was doing well- no unusual dyspnea and he reported compliance with his medications, though he indicated then he was back on Entresto (?).     Past Medical History:  Diagnosis Date  . At risk for sleep apnea    STOP-BANG= 4      SENT TO PCP 11-10-2014  . CKD (chronic kidney disease), stage III   . Dyspnea on exertion   . Hyperlipidemia   . Hypertension    at age 73  . Malignant hypertension    dx age 10  . Nonischemic cardiomyopathy ([place])   . Penile adhesions   . Systolic and diastolic CHF, chronic ([place]) dx 09-18-2014   cardiologist-  dr Dani Gobble croitoru----  ef 25-30% per last note 11-03-2014    Past Surgical History:  Procedure Laterality Date  . CIRCUMCISION REVISION N/A 11/14/2014   Procedure: CIRCUMCISION REVISION, LYSIS OF ADHESIONS;  Surgeon: Lowella Bandy,  MD;  Location: [facility];  Service: Urology;  Laterality: N/A;  . NO PAST SURGERIES    . TRANSTHORACIC ECHOCARDIOGRAM  09-19-2014  dr croitoru   moderate LVH,  ef 35% (per dr croitoru note, ef 25-30%), diffuse hypokinesis of basal inferior myocardium,  trivial AR,  mild MR    Current Medications: Current Meds  Medication Sig  . acetaminophen (TYLENOL) 500 MG tablet Take 1,000 mg by mouth every 6 (six) hours as needed for mild pain.  . baclofen (LIORESAL) 10 MG tablet Take 0.5-1 tablets (5-10 mg total) by mouth 3 (three) times daily as needed for muscle spasms.  . carvedilol (COREG) 25 MG tablet Take 1 tablet (25 mg total) by mouth 2 (two) times daily with a meal.  . furosemide (LASIX) 40 MG tablet Take 1 tablet (40 mg total) by mouth daily.  . hydrALAZINE (APRESOLINE) 25 MG tablet Take 1 tablet (25 mg total) by mouth 2 (two) times daily.  . sacubitril-valsartan (ENTRESTO) 49-51 MG Take 1 tablet by mouth 2 (two) times daily.  . simvastatin (ZOCOR) 20 MG tablet Take 1 tablet (20 mg total) by mouth daily.  . [DISCONTINUED] furosemide (LASIX) 40 MG tablet Take 1 tablet (40 mg  total) by mouth daily.  . [DISCONTINUED] warfarin (COUMADIN) 5 MG tablet TAKE 1 TABLET(5 MG) BY MOUTH DAILY     Allergies:   Isosorbide nitrate and Lisinopril   Social History   Socioeconomic History  . Marital status: Single    Spouse name: Not on file  . Number of children: Not on file  . Years of education: Not on file  . Highest education level: Not on file  Occupational History  . 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 Financial resource strain: Not on file  . Food insecurity    Worry: Not on file    Inability: Not on file  . Transportation needs    Medical: Not on file    Non-medical: Not on file  Tobacco Use  . Smoking status: Current Every Day Smoker    Packs/day: 0.50    Years: 10.00    Pack years: 5.00    Types: Cigarettes    Last attempt to quit: 04/19/2014    Years since  quitting: 4.9  . Smokeless tobacco: Never Used  Substance and Sexual Activity  . Alcohol use: Yes    Alcohol/week: 0.0 standard drinks    Comment: 3 beers per week   . Drug use: Yes    Types: Marijuana  . Sexual activity: Yes  Lifestyle  . Physical activity    Days per week: Not on file    Minutes per session: Not on file  . Stress: Not on file  Relationships  . Social Musician on phone: Not on file    Gets together: Not on file    Attends religious service: Not on file    Active member of club or organization: Not on file    Attends meetings of clubs or organizations: Not on file    Relationship status: Not on file  Other Topics Concern  . Not on file  Social History Narrative  . Not on file     Family History: The patient's family history includes Diabetes in his mother; Hypertension in his mother; Kidney disease (age of onset: 65) in his brother.  ROS:   Please see the history of present illness.     All other systems reviewed and are negative.  EKGs/Labs/Other Studies Reviewed:    The following studies were reviewed today: Echo 11/29/2018  Recent Labs: 11/19/2018: Magnesium 1.8 11/23/2018: B Natriuretic Peptide 413.4; Hemoglobin 13.1; Platelets 295 01/04/2019: BUN 26; Creatinine, Ser 1.80; Potassium 4.3; Sodium 141  Recent Lipid Panel    Component Value Date/Time   CHOL 174 11/18/2014 0133   TRIG 136 11/18/2014 0133   HDL 33 (L) 11/18/2014 0133   CHOLHDL 5.3 11/18/2014 0133   VLDL 27 11/18/2014 0133   LDLCALC 114 (H) 11/18/2014 0133    Physical Exam:    VS:  BP 122/84   Pulse 61   Temp (!) 97.1 F (36.2 C) (Temporal)   Ht 5\' 11"  (1.803 m)   Wt (!) 325 lb (147.4 kg)   SpO2 96%   BMI 45.33 kg/m     Wt Readings from Last 3 Encounters:  03/11/19 (!) 325 lb (147.4 kg)  02/15/19 (!) 321 lb (145.6 kg)  01/04/19 (!) 316 lb 9.6 oz (143.6 kg)     GEN: Obese AA male, well developed in no acute distress HEENT: Normal NECK: No JVD; No carotid  bruits LYMPHATICS: No lymphadenopathy CARDIAC: RRR, no murmurs, rubs, gallops RESPIRATORY:  Clear to auscultation without rales, wheezing or rhonchi  ABDOMEN: Soft, non-tender, non-distended MUSCULOSKELETAL:  No edema; No deformity  SKIN: Warm and dry NEUROLOGIC:  Alert and oriented x 3 PSYCHIATRIC:  Normal affect   ASSESSMENT:    Nonischemic cardiomyopathy (HCC) EF 20% in 2016, improved to 40% on medications in 2017 but has been off and on compliant. His EF in 2019 was again 20% after he stopped medications.  His EF in July 2020 was 20% when he presented in CHF, off medications.   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   History of embolic stroke Embolic stroke 2016- LVT on echo-treated with anticoagulation for 12 months  History of DVT (deep vein thrombosis) Eliquis changed to Coumadin July 2020 when he was noted to have LVT while in [facility]  Hypertensive cardiovascular disease Most recent echo July 2020 showed an EF of 20% with LVT-  CKD (chronic kidney disease) stage 2, GFR 60-89 ml/min GFR 56  Morbid obesity (HCC) BMI 44  PLAN:    Same Rx for now- f/u 3 months   Medication Adjustments/Labs and Tests Ordered: Current medicines are reviewed at length with the patient today.  Concerns regarding medicines are outlined above.  No orders of the defined types were placed in this encounter.  Meds ordered this encounter  Medications  . furosemide (LASIX) 40 MG tablet    Sig: Take 1 tablet (40 mg total) by mouth daily.    Dispense:  90 tablet    Refill:  3    Patient Instructions  Medication Instructions:  Your physician recommends that you continue on your current medications as directed. Please refer to the Current Medication list given to you today. *If you need a refill on your cardiac medications before your next appointment, please call your pharmacy*  Lab Work: None  If you have labs  (blood work) drawn today and your tests are completely normal, you will receive your results only by: Marland Kitchen MyChart Message (if you have MyChart) OR . A paper copy in the mail If you have any lab test that is abnormal or we need to change your treatment, we will call you to review the results.  Testing/Procedures: None   Follow-Up: At [facility], you and your health needs are our priority.  As part of our continuing mission to provide you with exceptional heart care, we have created designated Provider Care Teams.  These Care Teams include your primary Cardiologist (physician) and Advanced Practice Providers (APPs -  Physician Assistants and Nurse Practitioners) who all work together to provide you with the care you need, when you need it.  Your next appointment:   6 months  The format for your next appointment:   In Person  Provider:   Thurmon Fair, MD  Other Instructions     Signed, Corine Shelter, PA-C  03/15/2019 5:07 PM    [facility]

## 2019-03-16 ENCOUNTER — Ambulatory Visit (INDEPENDENT_AMBULATORY_CARE_PROVIDER_SITE_OTHER): Payer: Medicaid Other | Admitting: Pharmacist Clinician (PhC)/ Clinical Pharmacy Specialist

## 2019-03-16 ENCOUNTER — Other Ambulatory Visit: Payer: Self-pay

## 2019-03-16 DIAGNOSIS — I240 Acute coronary thrombosis not resulting in myocardial infarction: Secondary | ICD-10-CM | POA: Diagnosis not present

## 2019-03-16 LAB — POCT INR: INR: 1.6 — AB (ref 2.0–3.0)

## 2019-03-16 NOTE — Patient Instructions (Signed)
Take 3 tablets Wednesday Oct 28 and Thursday Oct 29.  Then take 2 tablets daily except 3 tablets each Monday.  Recheck in 3 weeks. Call with any questions # [phone].

## 2019-03-28 ENCOUNTER — Other Ambulatory Visit: Payer: Self-pay

## 2019-03-28 MED ORDER — WARFARIN SODIUM 5 MG PO TABS: ORAL_TABLET | ORAL | 0 refills | Status: DC

## 2019-04-08 ENCOUNTER — Other Ambulatory Visit: Payer: Self-pay

## 2019-04-08 ENCOUNTER — Other Ambulatory Visit: Payer: Self-pay | Admitting: Pharmacist Clinician (PhC)/ Clinical Pharmacy Specialist

## 2019-04-08 ENCOUNTER — Ambulatory Visit (INDEPENDENT_AMBULATORY_CARE_PROVIDER_SITE_OTHER): Payer: Medicaid Other | Admitting: Pharmacist Clinician (PhC)/ Clinical Pharmacy Specialist

## 2019-04-08 DIAGNOSIS — Z7901 Long term (current) use of anticoagulants: Secondary | ICD-10-CM | POA: Diagnosis not present

## 2019-04-08 DIAGNOSIS — Z5181 Encounter for therapeutic drug level monitoring: Secondary | ICD-10-CM | POA: Diagnosis not present

## 2019-04-08 DIAGNOSIS — I240 Acute coronary thrombosis not resulting in myocardial infarction: Secondary | ICD-10-CM

## 2019-04-08 LAB — POCT INR: INR: 2 (ref 2.0–3.0)

## 2019-04-08 MED ORDER — WARFARIN SODIUM 5 MG PO TABS: ORAL_TABLET | ORAL | 0 refills | Status: DC

## 2019-05-05 ENCOUNTER — Telehealth: Payer: Self-pay

## 2019-05-05 NOTE — Telephone Encounter (Signed)
lmom for overdue inr 

## 2019-05-11 ENCOUNTER — Other Ambulatory Visit: Payer: Self-pay

## 2019-05-11 ENCOUNTER — Ambulatory Visit (INDEPENDENT_AMBULATORY_CARE_PROVIDER_SITE_OTHER): Payer: Medicaid Other | Admitting: Pharmacist Clinician (PhC)/ Clinical Pharmacy Specialist

## 2019-05-11 DIAGNOSIS — I240 Acute coronary thrombosis not resulting in myocardial infarction: Secondary | ICD-10-CM

## 2019-05-11 LAB — POCT INR: INR: 2.4 (ref 2.0–3.0)

## 2019-05-24 ENCOUNTER — Other Ambulatory Visit (HOSPITAL_COMMUNITY): Payer: Medicaid Other

## 2019-05-31 ENCOUNTER — Ambulatory Visit (HOSPITAL_COMMUNITY): Payer: Medicaid Other | Attending: Cardiology

## 2019-05-31 ENCOUNTER — Other Ambulatory Visit: Payer: Self-pay

## 2019-05-31 ENCOUNTER — Other Ambulatory Visit: Payer: Self-pay | Admitting: Cardiovascular Disease

## 2019-05-31 DIAGNOSIS — I240 Acute coronary thrombosis not resulting in myocardial infarction: Secondary | ICD-10-CM | POA: Diagnosis not present

## 2019-05-31 MED ORDER — PERFLUTREN LIPID MICROSPHERE
1.0000 mL | INTRAVENOUS | Status: AC | PRN
  Administered 2019-05-31: 2 mL via INTRAVENOUS

## 2019-06-08 ENCOUNTER — Ambulatory Visit (INDEPENDENT_AMBULATORY_CARE_PROVIDER_SITE_OTHER): Payer: Medicaid Other | Admitting: Pharmacist Clinician (PhC)/ Clinical Pharmacy Specialist

## 2019-06-08 ENCOUNTER — Other Ambulatory Visit: Payer: Self-pay

## 2019-06-08 DIAGNOSIS — Z7901 Long term (current) use of anticoagulants: Secondary | ICD-10-CM | POA: Diagnosis not present

## 2019-06-08 DIAGNOSIS — I240 Acute coronary thrombosis not resulting in myocardial infarction: Secondary | ICD-10-CM | POA: Diagnosis not present

## 2019-06-08 LAB — POCT INR: INR: 2.5 (ref 2.0–3.0)

## 2019-07-01 ENCOUNTER — Encounter: Payer: Self-pay | Admitting: Cardiovascular Disease

## 2019-07-01 ENCOUNTER — Ambulatory Visit (INDEPENDENT_AMBULATORY_CARE_PROVIDER_SITE_OTHER): Payer: Medicaid Other | Admitting: Cardiovascular Disease

## 2019-07-01 ENCOUNTER — Other Ambulatory Visit: Payer: Self-pay

## 2019-07-01 ENCOUNTER — Ambulatory Visit (INDEPENDENT_AMBULATORY_CARE_PROVIDER_SITE_OTHER): Payer: Medicaid Other | Admitting: Pharmacist Clinician (PhC)/ Clinical Pharmacy Specialist

## 2019-07-01 VITALS — BP 130/83 | HR 83 | Temp 97.0°F | Ht 71.0 in | Wt 333.6 lb

## 2019-07-01 DIAGNOSIS — I10 Essential (primary) hypertension: Secondary | ICD-10-CM

## 2019-07-01 DIAGNOSIS — I240 Acute coronary thrombosis not resulting in myocardial infarction: Secondary | ICD-10-CM | POA: Diagnosis not present

## 2019-07-01 DIAGNOSIS — I5042 Chronic combined systolic (congestive) and diastolic (congestive) heart failure: Secondary | ICD-10-CM

## 2019-07-01 DIAGNOSIS — I513 Intracardiac thrombosis, not elsewhere classified: Secondary | ICD-10-CM

## 2019-07-01 DIAGNOSIS — N1831 Chronic kidney disease, stage 3a: Secondary | ICD-10-CM

## 2019-07-01 LAB — POCT INR: INR: 2 (ref 2.0–3.0)

## 2019-07-01 MED ORDER — ENTRESTO 97-103 MG PO TABS: 1.0000 | ORAL_TABLET | Freq: Two times a day (BID) | ORAL | 5 refills | Status: DC

## 2019-07-01 NOTE — Patient Instructions (Signed)
Medication Instructions:  STOP the Hydralazine START Entresto 97/103 twice daily  *If you need a refill on your cardiac medications before your next appointment, please call your pharmacy*  Lab Work: None ordered If you have labs (blood work) drawn today and your tests are completely normal, you will receive your results only by: Marland Kitchen MyChart Message (if you have MyChart) OR . A paper copy in the mail If you have any lab test that is abnormal or we need to change your treatment, we will call you to review the results.  Testing/Procedures: None ordered  Follow-Up: At [facility], you and your health needs are our priority.  As part of our continuing mission to provide you with exceptional heart care, we have created designated Provider Care Teams.  These Care Teams include your primary Cardiologist (physician) and Advanced Practice Providers (APPs -  Physician Assistants and Nurse Practitioners) who all work together to provide you with the care you need, when you need it.  Your next appointment:   6 month(s)  The format for your next appointment:   In Person  Provider:   You may see Thurmon Fair, MD or one of the following Advanced Practice Providers on your designated Care Team:    Azalee Course, PA-C  Micah Flesher, PA-C or   Judy Pimple, [place]

## 2019-07-02 NOTE — Progress Notes (Signed)
Patient ID: Shawn [name], male   DOB: 1986/01/15, 34 y.o.   MRN: [id number]    Cardiology Office Note    Date:  07/02/2019   ID:  Shawn [name], DOB 07-Sep-1985, MRN [id number]  PCP:  Patient, No Pcp Per  Cardiologist:   Thurmon Fair, MD   Chief Complaint  Patient presents with  . Congestive Heart Failure    History of Present Illness:  Shawn [name] is a 34 y.o. 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  After initiation of medical therapy his echo in January 2017 showed a marked improvement in LVEF at 40-45%, but the LV was still moderately dilated. No LV clot was seen, no signs of high filling pressures.  He improved to NYHA functional class I-2.  He became less compliant with dietary restrictions and continued to smoke cigarettes.  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  On a 2020 trip to [place] he began developing leg pain and mild swelling and was diagnosed with a left posterior tibial DVT by ultrasound.  Eliquis was started on July 3.  He developed worsening dyspnea and came to the emergency room, improving after being administered furosemide IV.  CT angiography did not show evidence of pulmonary embolism, but an echocardiogram was then ordered and showed that LVEF had dropped back to 20-25% and he had again developed an LV apical thrombus.  The decision was made to switch from Eliquis to warfarin.  He is feeling well and has no physical limitations.  He is working full-time in a fairly physical job.  Taking 20 mg of furosemide daily was not enough to keep the edema at bay, but he feels great on 40 mg daily.  He has been compliant with his medications.  Echo cardiogram in January 2021 showed resolution of the left ventricular thrombus, but the LVEF is still poor at less than 20%.  There was  evidence of pseudonormal mitral inflow.  Echo on 05/31/2019   1. Left ventricular ejection fraction, by visual estimation, is <20%. The left ventricle has severely decreased function. There is severely increased left ventricular hypertrophy.  2. Left ventricular diastolic parameters are consistent with Grade II diastolic dysfunction (pseudonormalization).  3. Severely dilated left ventricular internal cavity size.  4. The left ventricle demonstrates global hypokinesis.  5. Global right ventricle has normal systolic function.The right ventricular size is normal.  6. Left atrial size was mildly dilated.  7. Right atrial size was normal.  8. The mitral valve is normal in structure. Mild mitral valve regurgitation. No evidence of mitral stenosis.  9. The tricuspid valve is normal in structure.  10. The aortic valve is tricuspid. Aortic valve regurgitation is not visualized. No evidence of aortic valve sclerosis or stenosis.  11. The pulmonic valve was normal in structure. Pulmonic valve regurgitation is not visualized.  12. The inferior vena cava is normal in size with greater than 50% respiratory variability, suggesting right atrial pressure of 3 mmHg.  13. Severe global reduction in LV systolic function; severe LVH; severe LVE; no apical thrombus noted using definity; mild MR; mild LAE.   Past Medical History:  Diagnosis Date  . At risk for sleep apnea    STOP-BANG= 4      SENT TO PCP 11-10-2014  . CKD (chronic kidney disease), stage III   . Dyspnea on exertion   .  Hyperlipidemia   . Hypertension    at age 22  . Malignant hypertension    dx age 69  . Nonischemic cardiomyopathy (HCC)   . Penile adhesions   . Systolic and diastolic CHF, chronic (HCC) dx 09-18-2014   cardiologist-  dr Rachelle Hora Miaisabella Bacorn----  ef 25-30% per last note 11-03-2014    Past Surgical History:  Procedure Laterality Date  . CIRCUMCISION REVISION N/A 11/14/2014   Procedure: CIRCUMCISION REVISION, LYSIS OF  ADHESIONS;  Surgeon: Su Grand, MD;  Location: [facility];  Service: Urology;  Laterality: N/A;  . NO PAST SURGERIES    . TRANSTHORACIC ECHOCARDIOGRAM  09-19-2014  dr Ahana Najera   moderate LVH,  ef 35% (per dr Taishaun Levels note, ef 25-30%), diffuse hypokinesis of basal inferior myocardium,  trivial AR,  mild MR    Outpatient Medications Prior to Visit  Medication Sig Dispense Refill  . acetaminophen (TYLENOL) 500 MG tablet Take 1,000 mg by mouth every 6 (six) hours as needed for mild pain.    . baclofen (LIORESAL) 10 MG tablet Take 0.5-1 tablets (5-10 mg total) by mouth 3 (three) times daily as needed for muscle spasms. 30 each 0  . carvedilol (COREG) 25 MG tablet Take 1 tablet (25 mg total) by mouth 2 (two) times daily with a meal. 180 tablet 3  . furosemide (LASIX) 40 MG tablet Take 1 tablet (40 mg total) by mouth daily. 90 tablet 3  . simvastatin (ZOCOR) 20 MG tablet Take 1 tablet (20 mg total) by mouth every evening. 30 tablet 1  . warfarin (COUMADIN) 5 MG tablet Take 2 to 3 tablets (10 to 15 mg ) daily as directed by coumadin clinic 225 tablet 0  . hydrALAZINE (APRESOLINE) 25 MG tablet Take 1 tablet (25 mg total) by mouth 2 (two) times daily. 180 tablet 3  . sacubitril-valsartan (ENTRESTO) 49-51 MG Take 1 tablet by mouth 2 (two) times daily. 60 tablet 0   No facility-administered medications prior to visit.     Allergies:   Isosorbide nitrate and Lisinopril   Social History   Socioeconomic History  . Marital status: Single    Spouse name: Not on file  . Number of children: Not on file  . Years of education: Not on file  . Highest education level: Not on file  Occupational History  . 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 Smoking status: Current Every Day Smoker    Packs/day: 0.50    Years: 10.00    Pack years: 5.00    Types: Cigarettes    Last attempt to quit: 04/19/2014    Years since quitting: 5.2  . Smokeless tobacco: Never Used  Substance and Sexual  Activity  . Alcohol use: Yes    Alcohol/week: 0.0 standard drinks    Comment: 3 beers per week   . Drug use: Yes    Types: Marijuana  . Sexual activity: Yes  Other Topics Concern  . Not on file  Social History Narrative  . Not on file   Social Determinants of Health   Financial Resource Strain:   . Difficulty of Paying Living Expenses: Not on file  Food Insecurity:   . Worried About Programme researcher, broadcasting/film/video in the Last Year: Not on file  . Ran Out of Food in the Last Year: Not on file  Transportation Needs:   . Lack of Transportation (Medical): Not on file  . Lack of Transportation (Non-Medical): Not on file  Physical Activity:   .  Days of Exercise per Week: Not on file  . Minutes of Exercise per Session: Not on file  Stress:   . Feeling of Stress : Not on file  Social Connections:   . Frequency of Communication with Friends and Family: Not on file  . Frequency of Social Gatherings with Friends and Family: Not on file  . Attends Religious Services: Not on file  . Active Member of Clubs or Organizations: Not on file  . Attends Archivist Meetings: Not on file  . Marital Status: Not on file     Family History:  The patient's family history includes Diabetes in his mother; Hypertension in his mother; Kidney disease (age of onset: 1) in his brother.   ROS:   Please see the history of present illness.    ROS All other systems are reviewed and are negative.   PHYSICAL EXAM:   VS:  BP 130/83   Pulse 83   Temp (!) 97 F (36.1 C)   Ht 5\' 11"  (1.803 m)   Wt (!) 333 lb 9.6 oz (151.3 kg)   SpO2 95%   BMI 46.53 kg/m      Wt Readings from Last 3 Encounters:  07/01/19 (!) 333 lb 9.6 oz (151.3 kg)  03/11/19 (!) 325 lb (147.4 kg)  02/15/19 (!) 321 lb (145.6 kg)     General: Alert, oriented x3, no distress, morbidly obese Head: no evidence of trauma, PERRL, EOMI, no exophtalmos or lid lag, no myxedema, no xanthelasma; normal ears, nose and oropharynx Neck: Very  difficult to evaluate jugular venous pulsations and no hepatojugular reflux; brisk carotid pulses without delay and no carotid bruits Chest: clear to auscultation, no signs of consolidation by percussion or palpation, normal fremitus, symmetrical and full respiratory excursions Cardiovascular: normal position and quality of the apical impulse, regular rhythm, normal first and second heart sounds, no murmurs, rubs or gallops Abdomen: no tenderness or distention, no masses by palpation, no abnormal pulsatility or arterial bruits, normal bowel sounds, no hepatosplenomegaly Extremities: no clubbing, cyanosis or edema; 2+ radial, ulnar and brachial pulses bilaterally; 2+ right femoral, posterior tibial and dorsalis pedis pulses; 2+ left femoral, posterior tibial and dorsalis pedis pulses; no subclavian or femoral bruits Neurological: grossly nonfocal Psych: Normal mood and affect   Studies/Labs Reviewed:  Lower extremity venous ultrasound 11/19/2018 Echocardiogram  05/31/2019  1. Left ventricular ejection fraction, by visual estimation, is <20%. The  left ventricle has severely decreased function. There is severely  increased left ventricular hypertrophy.  2. Left ventricular diastolic parameters are consistent with Grade II  diastolic dysfunction (pseudonormalization).  3. Severely dilated left ventricular internal cavity size.  4. The left ventricle demonstrates global hypokinesis.  5. Global right ventricle has normal systolic function.The right  ventricular size is normal.  6. Left atrial size was mildly dilated.  7. Right atrial size was normal.  8. The mitral valve is normal in structure. Mild mitral valve  regurgitation. No evidence of mitral stenosis.  9. The tricuspid valve is normal in structure.  10. The aortic valve is tricuspid. Aortic valve regurgitation is not  visualized. No evidence of aortic valve sclerosis or stenosis.  11. The pulmonic valve was normal in  structure. Pulmonic valve  regurgitation is not visualized.  12. The inferior vena cava is normal in size with greater than 50%  respiratory variability, suggesting right atrial pressure of 3 mmHg.  13. Severe global reduction in LV systolic function; severe LVH; severe  LVE; no apical thrombus  noted using definity; mild MR; mild LAE.   EKG:  EKG is ordered today.  It shows normal sinus rhythm, left atrial abnormality, left ventricular hypertrophy with repolarization changes, QTC 465 ms  Recent Labs: 11/19/2018: Magnesium 1.8 11/23/2018: B Natriuretic Peptide 413.4; Hemoglobin 13.1; Platelets 295 01/04/2019: BUN 26; Creatinine, Ser 1.80; Potassium 4.3; Sodium 141   Lipid Panel    Component Value Date/Time   CHOL 174 11/18/2014 0133   TRIG 136 11/18/2014 0133   HDL 33 (L) 11/18/2014 0133   CHOLHDL 5.3 11/18/2014 0133   VLDL 27 11/18/2014 0133   LDLCALC 114 (H) 11/18/2014 0133     ASSESSMENT:    1. Chronic combined systolic and diastolic CHF (congestive heart failure) (HCC)   2. Essential hypertension, malignant   3. Left ventricular thrombus   4. Stage 3a chronic kidney disease   5. Morbid obesity (HCC)      PLAN:  In order of problems listed above:  1. CHF: LVEF remains severely depressed.  We will increase the Entresto to the maximum dose (then we can stop the hydralazine) and plan to bring him back to further adjust heart failure medications, for example by adding Corlanor.  Caution with spironolactone because of his renal dysfunction.  On the other hand he has excellent functional status.  We may have to review ICD implantation again.  Recheck echo after 3 months on maximum dose Entresto and additional optimization of medical therapy.  He does not have indication for CRT. 2. HTN: Blood pressure control is improved but would like him consistently less than 130/80. 3. History of LV thrombus: Recurrent left ventricular thrombus, resolved after switching to warfarin. 4. CKD 3a,  GFR stable around 50 5. Morbid besity: Critical to lose weight in the long run, "obesity paradox not withstanding".   Medication Adjustments/Labs and Tests Ordered: Current medicines are reviewed at length with the patient today.  Concerns regarding medicines are outlined above.  Medication changes, Labs and Tests ordered today are listed in the Patient Instructions below. Patient Instructions  Medication Instructions:  STOP the Hydralazine START Entresto 97/103 twice daily  *If you need a refill on your cardiac medications before your next appointment, please call your pharmacy*  Lab Work: None ordered If you have labs (blood work) drawn today and your tests are completely normal, you will receive your results only by: Marland Kitchen MyChart Message (if you have MyChart) OR . A paper copy in the mail If you have any lab test that is abnormal or we need to change your treatment, we will call you to review the results.  Testing/Procedures: None ordered  Follow-Up: At [facility], you and your health needs are our priority.  As part of our continuing mission to provide you with exceptional heart care, we have created designated Provider Care Teams.  These Care Teams include your primary Cardiologist (physician) and Advanced Practice Providers (APPs -  Physician Assistants and Nurse Practitioners) who all work together to provide you with the care you need, when you need it.  Your next appointment:   6 month(s)  The format for your next appointment:   In Person  Provider:   You may see Thurmon Fair, MD or one of the following Advanced Practice Providers on your designated Care Team:    Azalee Course, PA-C  Micah Flesher, [place] or   Judy Pimple, PA-C        Signed, Thurmon Fair, MD  07/02/2019 3:19 PM    [facility]  [address], [place], [place]  [address] Phone: [phone]; Fax: [phone]    

## 2019-08-01 ENCOUNTER — Other Ambulatory Visit: Payer: Self-pay

## 2019-08-01 MED ORDER — WARFARIN SODIUM 5 MG PO TABS: ORAL_TABLET | ORAL | 0 refills | Status: DC

## 2019-08-05 ENCOUNTER — Ambulatory Visit (INDEPENDENT_AMBULATORY_CARE_PROVIDER_SITE_OTHER): Payer: Medicaid Other | Admitting: Pharmacist Clinician (PhC)/ Clinical Pharmacy Specialist

## 2019-08-05 ENCOUNTER — Other Ambulatory Visit: Payer: Self-pay

## 2019-08-05 ENCOUNTER — Encounter: Payer: Self-pay | Admitting: *Deleted

## 2019-08-05 DIAGNOSIS — Z5181 Encounter for therapeutic drug level monitoring: Secondary | ICD-10-CM

## 2019-08-05 DIAGNOSIS — I240 Acute coronary thrombosis not resulting in myocardial infarction: Secondary | ICD-10-CM

## 2019-08-05 DIAGNOSIS — Z7901 Long term (current) use of anticoagulants: Secondary | ICD-10-CM | POA: Diagnosis not present

## 2019-08-05 LAB — POCT INR: INR: 2.5 (ref 2.0–3.0)

## 2019-09-15 ENCOUNTER — Other Ambulatory Visit: Payer: Self-pay

## 2019-09-15 MED ORDER — SIMVASTATIN 20 MG PO TABS: 20.0000 mg | ORAL_TABLET | Freq: Every evening | ORAL | 1 refills | Status: DC

## 2019-09-16 ENCOUNTER — Ambulatory Visit (INDEPENDENT_AMBULATORY_CARE_PROVIDER_SITE_OTHER): Payer: Medicaid Other | Admitting: Pharmacist

## 2019-09-16 ENCOUNTER — Other Ambulatory Visit: Payer: Self-pay

## 2019-09-16 DIAGNOSIS — I240 Acute coronary thrombosis not resulting in myocardial infarction: Secondary | ICD-10-CM

## 2019-09-16 DIAGNOSIS — Z7901 Long term (current) use of anticoagulants: Secondary | ICD-10-CM

## 2019-09-16 LAB — POCT INR: INR: 1.8 — AB (ref 2.0–3.0)

## 2019-10-21 ENCOUNTER — Other Ambulatory Visit: Payer: Self-pay

## 2019-10-21 ENCOUNTER — Ambulatory Visit (INDEPENDENT_AMBULATORY_CARE_PROVIDER_SITE_OTHER): Payer: Medicaid Other | Admitting: Pharmacist Clinician (PhC)/ Clinical Pharmacy Specialist

## 2019-10-21 DIAGNOSIS — I240 Acute coronary thrombosis not resulting in myocardial infarction: Secondary | ICD-10-CM | POA: Diagnosis not present

## 2019-10-21 LAB — POCT INR: INR: 1.7 — AB (ref 2.0–3.0)

## 2019-11-11 ENCOUNTER — Other Ambulatory Visit: Payer: Self-pay

## 2019-11-11 ENCOUNTER — Ambulatory Visit (INDEPENDENT_AMBULATORY_CARE_PROVIDER_SITE_OTHER): Payer: Medicaid Other | Admitting: Pharmacist Clinician (PhC)/ Clinical Pharmacy Specialist

## 2019-11-11 DIAGNOSIS — I240 Acute coronary thrombosis not resulting in myocardial infarction: Secondary | ICD-10-CM

## 2019-11-11 DIAGNOSIS — Z7901 Long term (current) use of anticoagulants: Secondary | ICD-10-CM | POA: Diagnosis not present

## 2019-11-11 LAB — POCT INR: INR: 1.7 — AB (ref 2.0–3.0)

## 2019-11-11 MED ORDER — WARFARIN SODIUM 5 MG PO TABS: ORAL_TABLET | ORAL | 0 refills | Status: DC

## 2019-11-11 NOTE — Patient Instructions (Signed)
Take 4 tablets today Friday June 25, then increase dose to 2 tablets daily except 3 tablets each Monday, Wednesday and Friday.  Recheck in 2 weeks. Call with any questions # [phone].

## 2019-11-25 ENCOUNTER — Ambulatory Visit (INDEPENDENT_AMBULATORY_CARE_PROVIDER_SITE_OTHER): Payer: Medicaid Other | Admitting: Pharmacist

## 2019-11-25 ENCOUNTER — Other Ambulatory Visit: Payer: Self-pay

## 2019-11-25 DIAGNOSIS — I240 Acute coronary thrombosis not resulting in myocardial infarction: Secondary | ICD-10-CM | POA: Diagnosis not present

## 2019-11-25 DIAGNOSIS — Z5181 Encounter for therapeutic drug level monitoring: Secondary | ICD-10-CM | POA: Diagnosis not present

## 2019-11-25 DIAGNOSIS — Z7901 Long term (current) use of anticoagulants: Secondary | ICD-10-CM | POA: Diagnosis not present

## 2019-11-25 DIAGNOSIS — Z86718 Personal history of other venous thrombosis and embolism: Secondary | ICD-10-CM

## 2019-11-25 LAB — POCT INR: INR: 2.5 (ref 2.0–3.0)

## 2019-11-25 NOTE — Patient Instructions (Signed)
Description   Take 2 tablets daily except 3 tablets each Monday, Wednesday and Friday.  Recheck in 4 weeks. Call with any questions # [phone].

## 2019-12-05 ENCOUNTER — Other Ambulatory Visit: Payer: Self-pay | Admitting: Physician Assistant

## 2019-12-05 DIAGNOSIS — I5043 Acute on chronic combined systolic (congestive) and diastolic (congestive) heart failure: Secondary | ICD-10-CM

## 2019-12-05 MED ORDER — CARVEDILOL 25 MG PO TABS: 25.0000 mg | ORAL_TABLET | Freq: Two times a day (BID) | ORAL | 3 refills | Status: DC

## 2019-12-29 NOTE — Progress Notes (Signed)
error 

## 2019-12-30 ENCOUNTER — Other Ambulatory Visit: Payer: Self-pay

## 2019-12-30 ENCOUNTER — Encounter: Payer: Self-pay | Admitting: General Practice

## 2019-12-30 ENCOUNTER — Ambulatory Visit (INDEPENDENT_AMBULATORY_CARE_PROVIDER_SITE_OTHER): Payer: Medicaid Other | Admitting: General Practice

## 2019-12-30 VITALS — BP 135/82 | HR 81 | Ht 71.0 in | Wt 333.2 lb

## 2019-12-30 DIAGNOSIS — I428 Other cardiomyopathies: Secondary | ICD-10-CM

## 2019-12-30 DIAGNOSIS — I5042 Chronic combined systolic (congestive) and diastolic (congestive) heart failure: Secondary | ICD-10-CM | POA: Diagnosis not present

## 2019-12-30 DIAGNOSIS — Z8673 Personal history of transient ischemic attack (TIA), and cerebral infarction without residual deficits: Secondary | ICD-10-CM

## 2019-12-30 DIAGNOSIS — I10 Essential (primary) hypertension: Secondary | ICD-10-CM

## 2019-12-30 DIAGNOSIS — N183 Chronic kidney disease, stage 3 unspecified: Secondary | ICD-10-CM

## 2019-12-30 NOTE — Patient Instructions (Signed)
Medication Instructions:  Continue current medications  *If you need a refill on your cardiac medications before your next appointment, please call your pharmacy*   Lab Work: None Ordered   Testing/Procedures: None Ordered   Follow-Up: At [facility], you and your health needs are our priority.  As part of our continuing mission to provide you with exceptional heart care, we have created designated Provider Care Teams.  These Care Teams include your primary Cardiologist (physician) and Advanced Practice Providers (APPs -  Physician Assistants and Nurse Practitioners) who all work together to provide you with the care you need, when you need it.  We recommend signing up for the patient portal called "MyChart".  Sign up information is provided on this After Visit Summary.  MyChart is used to connect with patients for Virtual Visits (Telemedicine).  Patients are able to view lab/test results, encounter notes, upcoming appointments, etc.  Non-urgent messages can be sent to your provider as well.   To learn more about what you can do with MyChart, go to [email or web address].    Your next appointment:   6 month(s)  The format for your next appointment:   In Person  Provider:   You may see Thurmon Fair, MD or one of the following Advanced Practice Providers on your designated Care Team:    Azalee Course, PA-C  Micah Flesher, [place] or   Judy Pimple, [place]    Other Instructions Increase physical activity

## 2019-12-30 NOTE — Progress Notes (Signed)
Cardiology Clinic Note   Patient Name: Shawn [name] Date of Encounter: 12/30/2019  Primary Care Provider:  Patient, No Pcp Per Primary Cardiologist:  Thurmon Fair, MD  Patient Profile    Shawn [name]. [name] 34 year old male presents today for follow-up of his chronic combined systolic and diastolic CHF, hypertension, nonischemic cardiomyopathy and hypertension.  Past Medical History    Past Medical History:  Diagnosis Date  . At risk for sleep apnea    STOP-BANG= 4      SENT TO PCP 11-10-2014  . CKD (chronic kidney disease), stage III   . Dyspnea on exertion   . Hyperlipidemia   . Hypertension    at age 89  . Malignant hypertension    dx age 57  . Nonischemic cardiomyopathy (HCC)   . Penile adhesions   . Systolic and diastolic CHF, chronic (HCC) dx 09-18-2014   cardiologist-  dr Rachelle Hora croitoru----  ef 25-30% per last note 11-03-2014   Past Surgical History:  Procedure Laterality Date  . CIRCUMCISION REVISION N/A 11/14/2014   Procedure: CIRCUMCISION REVISION, LYSIS OF ADHESIONS;  Surgeon: Su Grand, MD;  Location: [facility];  Service: Urology;  Laterality: N/A;  . NO PAST SURGERIES    . TRANSTHORACIC ECHOCARDIOGRAM  09-19-2014  dr croitoru   moderate LVH,  ef 35% (per dr croitoru note, ef 25-30%), diffuse hypokinesis of basal inferior myocardium,  trivial AR,  mild MR    Allergies  Allergies  Allergen Reactions  . Isosorbide Nitrate Other (See Comments)    headaches  . Lisinopril Cough    History of Present Illness    Shawn [name] was  seen on 01/04/2019 by Azalee Course PA-C.  During that time he was hypertensive and his Sherryll Burger was increased to 97-103 twice daily, and a BMP was drawn.  Planned for a repeat echocardiogram if his blood pressure was stable.  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   His LVEF had improved to 40 to 45% by January 2017 and no LV thrombus was seen at that time.  In July 2020 he was diagnosed with a posterior left tibial DVT during a trip to [place].  Eliquis was started on 12/16/2018.  He later developed worsening shortness of breath and came to the emergency department.  His symptoms did however improve with IV Lasix.  A CT angios of his chest showed no PE.  An echocardiogram during that time showed an LVEF that had decreased to 20 to 25% with the presence of a repeat LV thrombus.  His Eliquis was switched to Coumadin and on 12/01/2018 his valsartan was switched to Entresto 49-51 mg twice daily.  He presented to the clinic 02/15/19 and stated he ran out of his Sherryll Burger 49-51 medication his insurance would not approve his higher dose Entresto. He elected to start back on valsartan 160 an hour before his clinic visit.  He stated he was not taking his Entresto or Valsartan for around 3 weeks or so.  He was compliant with his Coumadin.    He was seen by Dr. Royann Shivers on 07/01/2019.  During that time he was doing well and denied physical limitations.  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  He had been compliant with his furosemide 40 mg daily.  He was also compliant with his other medications. His repeat echo 06/02/2019 showed less  than 20% EF, G2DD, and no evidence of clot.   He presents the clinic today for follow-up evaluation and states he has been doing fairly well since he stopped smoking.  He has however not been able to lose weight.  He states that he is watching his diet but occasionally has dietary indiscretion.  He states he walks his dog 3 times per week for about 10 minutes but does not exercise any other times.  He is also between jobs.  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  He indicates that his blood pressure has been much better controlled however, today he  took his medications just prior to his visit.  He also states that over the last month he feels he has had some increased fatigue.  I will have him increase his physical activity, become more strict with his diet, continue to monitor his blood pressure, and follow-up with Dr. Royann Shivers in 6 months.  He denies chest pain, shortness of breath, lower extremity edema, fatigue, palpitations, melena, hematuria, hemoptysis, diaphoresis, weakness, presyncope, syncope, orthopnea, and PND.  Home Medications    Prior to Admission medications   Medication Sig Start Date End Date Taking? Authorizing Provider  acetaminophen (TYLENOL) 500 MG tablet Take 1,000 mg by mouth every 6 (six) hours as needed for mild pain.    [provider]  baclofen (LIORESAL) 10 MG tablet Take 0.5-1 tablets (5-10 mg total) by mouth 3 (three) times daily as needed for muscle spasms. 05/08/18   Arthor Captain, PA-C  carvedilol (COREG) 25 MG tablet Take 1 tablet (25 mg total) by mouth 2 (two) times daily with a meal. 12/05/19   Croitoru, Mihai, MD  furosemide (LASIX) 40 MG tablet Take 1 tablet (40 mg total) by mouth daily. 03/11/19   Kilroy, Eda Paschal, PA-C  sacubitril-valsartan (ENTRESTO) 97-103 MG Take 1 tablet by mouth 2 (two) times daily. 07/01/19   Croitoru, Mihai, MD  simvastatin (ZOCOR) 20 MG tablet TAKE 1 TABLET(20 MG) BY MOUTH EVERY EVENING 12/05/19   Croitoru, Rachelle Hora, MD  warfarin (COUMADIN) 5 MG tablet Take 2 to 3 tablets (10 to 15 mg ) daily as directed by coumadin clinic 11/11/19   Croitoru, Rachelle Hora, MD    Family History    Family History  Problem Relation Age of Onset  . Diabetes Mother   . Hypertension Mother   . Kidney disease Brother 21       on dialysis. never went to the doctor    He indicated that his mother is alive. He indicated that his father is alive. He indicated that his brother is alive.  Social History    Social History   Socioeconomic History  . Marital status: Single    Spouse name: Not on  file  . Number of children: Not on file  . Years of education: Not on file  . Highest education level: Not on file  Occupational History  . 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 Smoking status: Current Every Day Smoker    Packs/day: 0.50    Years: 10.00    Pack years: 5.00    Types: Cigarettes    Last attempt to quit: 04/19/2014    Years since quitting: 5.7  . Smokeless tobacco: Never Used  Vaping Use  . Vaping Use: Never used  Substance and Sexual Activity  . Alcohol use: Yes    Alcohol/week: 0.0 standard drinks  Comment: 3 beers per week   . Drug use: Yes    Types: Marijuana  . Sexual activity: Yes  Other Topics Concern  . Not on file  Social History Narrative  . Not on file   Social Determinants of Health   Financial Resource Strain:   . Difficulty of Paying Living Expenses:   Food Insecurity:   . Worried About Programme researcher, broadcasting/film/video in the Last Year:   . Barista in the Last Year:   Transportation Needs:   . Freight forwarder (Medical):   Marland Kitchen Lack of Transportation (Non-Medical):   Physical Activity:   . Days of Exercise per Week:   . Minutes of Exercise per Session:   Stress:   . Feeling of Stress :   Social Connections:   . Frequency of Communication with Friends and Family:   . Frequency of Social Gatherings with Friends and Family:   . Attends Religious Services:   . Active Member of Clubs or Organizations:   . Attends Banker Meetings:   Marland Kitchen Marital Status:   Intimate Partner Violence:   . Fear of Current or Ex-Partner:   . Emotionally Abused:   Marland Kitchen Physically Abused:   . Sexually Abused:      Review of Systems    General:  No chills, fever, night sweats or weight changes.  Cardiovascular:  No chest pain, dyspnea on exertion, edema, orthopnea, palpitations, paroxysmal nocturnal dyspnea. Dermatological: No rash, lesions/masses Respiratory: No cough, dyspnea Urologic: No hematuria, dysuria Abdominal:   No nausea, vomiting,  diarrhea, bright red blood per rectum, melena, or hematemesis Neurologic:  No visual changes, wkns, changes in mental status. All other systems reviewed and are otherwise negative except as noted above.  Physical Exam    VS:  BP 135/82   Pulse 81   Ht 5\' 11"  (1.803 m)   Wt (!) 333 lb 3.2 oz (151.1 kg)   SpO2 96%   BMI 46.47 kg/m  , BMI Body mass index is 46.47 kg/m. GEN: Well nourished, well developed, in no acute distress. HEENT: normal. Neck: Supple, no JVD, carotid bruits, or masses. Cardiac: RRR, no murmurs, rubs, or gallops. No clubbing, cyanosis, edema.  Radials/DP/PT 2+ and equal bilaterally.  Respiratory:  Respirations regular and unlabored, clear to auscultation bilaterally. GI: Soft, nontender, nondistended, BS + x 4. MS: no deformity or atrophy. Skin: warm and dry, no rash. Neuro:  Strength and sensation are intact. Psych: Normal affect.  Accessory Clinical Findings    Recent Labs: 01/04/2019: BUN 26; Creatinine, Ser 1.80; Potassium 4.3; Sodium 141   Recent Lipid Panel    Component Value Date/Time   CHOL 174 11/18/2014 0133   TRIG 136 11/18/2014 0133   HDL 33 (L) 11/18/2014 0133   CHOLHDL 5.3 11/18/2014 0133   VLDL 27 11/18/2014 0133   LDLCALC 114 (H) 11/18/2014 0133    ECG personally reviewed by me today-none today.  EKG 11/24/2018 Sinus rhythm with occasional premature ventricular complexes 93 bpm  Echocardiogram 11/29/2018 IMPRESSIONS   1. The left ventricle has severely reduced systolic function, with an ejection fraction of 20-25%. The cavity size was severely dilated. There is moderate eccentric left ventricular hypertrophy. Left ventricular diastolic Doppler parameters are  indeterminate. 2. LVEF is severely depressed with inferior, inferoseptal akinesis. Ther is a thrombus at the LV apex. 3. The mitral valve is abnormal. Mild thickening of the mitral valve leaflet. There is mild mitral annular calcification present.  Echocardiogram  06/02/2019  IMPRESSIONS    1. Left ventricular ejection fraction, by visual estimation, is <20%. The  left ventricle has severely decreased function. There is severely  increased left ventricular hypertrophy.  2. Left ventricular diastolic parameters are consistent with Grade II  diastolic dysfunction (pseudonormalization).  3. Severely dilated left ventricular internal cavity size.  4. The left ventricle demonstrates global hypokinesis.  5. Global right ventricle has normal systolic function.The right  ventricular size is normal.  6. Left atrial size was mildly dilated.  7. Right atrial size was normal.  8. The mitral valve is normal in structure. Mild mitral valve  regurgitation. No evidence of mitral stenosis.  9. The tricuspid valve is normal in structure.  10. The aortic valve is tricuspid. Aortic valve regurgitation is not  visualized. No evidence of aortic valve sclerosis or stenosis.  11. The pulmonic valve was normal in structure. Pulmonic valve  regurgitation is not visualized.  12. The inferior vena cava is normal in size with greater than 50%  respiratory variability, suggesting right atrial pressure of 3 mmHg.  13. Severe global reduction in LV systolic function; severe LVH; severe  LVE; no apical thrombus noted using definity; mild MR; mild LAE.   Assessment & Plan   1.  Essential hypertension BP today 135/82.  Well-controlled at home.   Heart healthy low-sodium diet-salty 6 given Increase physical activity as tolerated Continue Entresto 97-103 Continue carvedilol 25 mg  twice daily Continue furosemide Blood pressure log  Nonischemic cardiomyopathy-continues to be very physically active working full-time.  No increased DOE or increased activity intolerance. echocardiogram 06/02/2019 showed less than 20% EF. Continue carvedilol, Entresto Heart healthy low-sodium diet Maintain physical activity  Chronic combined systolic and diastolic CHF- no shortness of  breath today euvolemic Continue carvedilol, Entresto Heart healthy low-sodium diet Maintain physical activity  Stage III chronic kidney disease- last creatinine 1.8 on 01/04/2019, BUN 26 (baseline) Continue to monitor.  History of CVA- due to LV thrombus.  No evidence of thrombus on last echocardiogram. Continue Coumadin 5 mg tablet daily  Morbid obesity-weight today 333 pounds patient states that he is starting a new diet and exercise program this week. Continue weight loss goals Heart healthy low-sodium diet Increase physical activity as tolerated  Disposition: Follow-up with Dr. Royann Shivers in 6 months.   Thomasene Ripple. Beola Vasallo NP-C    12/30/2019, 11:40 AM [facility] [address] Office [phone] Fax [phone]  Notice: This dictation was prepared with Dragon dictation along with smaller phrase technology. Any transcriptional errors that result from this process are unintentional and may not be corrected upon review.

## 2020-01-02 ENCOUNTER — Other Ambulatory Visit: Payer: Self-pay

## 2020-01-02 DIAGNOSIS — E785 Hyperlipidemia, unspecified: Secondary | ICD-10-CM

## 2020-01-04 ENCOUNTER — Other Ambulatory Visit: Payer: Self-pay

## 2020-01-04 ENCOUNTER — Ambulatory Visit (INDEPENDENT_AMBULATORY_CARE_PROVIDER_SITE_OTHER): Payer: Medicaid Other

## 2020-01-04 DIAGNOSIS — I240 Acute coronary thrombosis not resulting in myocardial infarction: Secondary | ICD-10-CM | POA: Diagnosis not present

## 2020-01-04 DIAGNOSIS — Z5181 Encounter for therapeutic drug level monitoring: Secondary | ICD-10-CM

## 2020-01-04 LAB — POCT INR: INR: 2.6 (ref 2.0–3.0)

## 2020-01-04 NOTE — Patient Instructions (Signed)
Take 2 tablets daily except 3 tablets each Monday, Wednesday and Friday.  Recheck in 6 weeks. Call with any questions # [phone].

## 2020-01-17 ENCOUNTER — Other Ambulatory Visit: Payer: Self-pay | Admitting: Cardiovascular Disease

## 2020-02-08 ENCOUNTER — Other Ambulatory Visit: Payer: Self-pay | Admitting: Cardiovascular Disease

## 2020-02-27 ENCOUNTER — Ambulatory Visit (INDEPENDENT_AMBULATORY_CARE_PROVIDER_SITE_OTHER): Payer: Medicaid Other

## 2020-02-27 DIAGNOSIS — I240 Acute coronary thrombosis not resulting in myocardial infarction: Secondary | ICD-10-CM | POA: Diagnosis not present

## 2020-02-27 DIAGNOSIS — Z5181 Encounter for therapeutic drug level monitoring: Secondary | ICD-10-CM | POA: Diagnosis not present

## 2020-02-27 LAB — POCT INR: INR: 1.7 — AB (ref 2.0–3.0)

## 2020-02-27 NOTE — Patient Instructions (Addendum)
Take 4 tablets today and then continue Taking 2 tablets daily except 3 tablets each Monday, Wednesday and Friday.  Recheck in 4 weeks. Call with any questions # [phone].   BILL [id number]

## 2020-03-06 ENCOUNTER — Other Ambulatory Visit: Payer: Self-pay

## 2020-03-06 MED ORDER — WARFARIN SODIUM 5 MG PO TABS: ORAL_TABLET | ORAL | 0 refills | Status: DC

## 2020-03-16 ENCOUNTER — Telehealth: Payer: Self-pay | Admitting: *Deleted

## 2020-03-16 NOTE — Telephone Encounter (Signed)
Shawn [name] Prior Auth sent through [employer]: Key: [id number]

## 2020-03-17 NOTE — Telephone Encounter (Signed)
Prior auth for Sherryll Burger has been approved through 03/16/2021

## 2020-04-04 ENCOUNTER — Telehealth: Payer: Self-pay

## 2020-04-04 NOTE — Telephone Encounter (Signed)
lmom for overdue inr 

## 2020-04-08 ENCOUNTER — Other Ambulatory Visit: Payer: Self-pay | Admitting: Cardiovascular Disease

## 2020-04-09 NOTE — Telephone Encounter (Signed)
Hold refill until seen 11/24 at 345pm

## 2020-04-16 NOTE — Telephone Encounter (Signed)
Hold refill until seen 04/18/20

## 2020-04-18 ENCOUNTER — Ambulatory Visit (INDEPENDENT_AMBULATORY_CARE_PROVIDER_SITE_OTHER): Payer: Medicaid Other

## 2020-04-18 ENCOUNTER — Other Ambulatory Visit: Payer: Self-pay

## 2020-04-18 DIAGNOSIS — Z5181 Encounter for therapeutic drug level monitoring: Secondary | ICD-10-CM | POA: Diagnosis not present

## 2020-04-18 DIAGNOSIS — I240 Acute coronary thrombosis not resulting in myocardial infarction: Secondary | ICD-10-CM

## 2020-04-18 LAB — POCT INR: INR: 2.5 (ref 2.0–3.0)

## 2020-04-18 MED ORDER — WARFARIN SODIUM 5 MG PO TABS: ORAL_TABLET | ORAL | 0 refills | Status: DC

## 2020-04-18 NOTE — Patient Instructions (Signed)
continue Taking 2 tablets daily except 3 tablets each Monday, Wednesday and Friday.  Recheck in 6 weeks. Call with any questions # [phone].   BILL [id number]

## 2020-04-30 ENCOUNTER — Other Ambulatory Visit: Payer: Self-pay | Admitting: Cardiology

## 2020-04-30 DIAGNOSIS — I5043 Acute on chronic combined systolic (congestive) and diastolic (congestive) heart failure: Secondary | ICD-10-CM

## 2020-05-22 DIAGNOSIS — Z20822 Contact with and (suspected) exposure to covid-19: Secondary | ICD-10-CM | POA: Diagnosis not present

## 2020-05-23 ENCOUNTER — Other Ambulatory Visit: Payer: Self-pay | Admitting: Cardiovascular Disease

## 2020-05-30 ENCOUNTER — Other Ambulatory Visit: Payer: Self-pay

## 2020-05-30 ENCOUNTER — Ambulatory Visit (INDEPENDENT_AMBULATORY_CARE_PROVIDER_SITE_OTHER): Payer: Medicaid Other

## 2020-05-30 DIAGNOSIS — Z5181 Encounter for therapeutic drug level monitoring: Secondary | ICD-10-CM | POA: Diagnosis not present

## 2020-05-30 DIAGNOSIS — I240 Acute coronary thrombosis not resulting in myocardial infarction: Secondary | ICD-10-CM | POA: Diagnosis not present

## 2020-05-30 LAB — POCT INR: INR: 3.8 — AB (ref 2.0–3.0)

## 2020-05-30 NOTE — Patient Instructions (Signed)
Hold tonight and then continue taking 2 tablets daily except 3 tablets each Monday, Wednesday and Friday.  Recheck in 4 weeks. Call with any questions # [phone].   BILL [id number]

## 2020-06-27 ENCOUNTER — Ambulatory Visit (INDEPENDENT_AMBULATORY_CARE_PROVIDER_SITE_OTHER): Payer: Medicaid Other

## 2020-06-27 ENCOUNTER — Other Ambulatory Visit: Payer: Self-pay

## 2020-06-27 DIAGNOSIS — I240 Acute coronary thrombosis not resulting in myocardial infarction: Secondary | ICD-10-CM | POA: Diagnosis not present

## 2020-06-27 DIAGNOSIS — Z5181 Encounter for therapeutic drug level monitoring: Secondary | ICD-10-CM

## 2020-06-27 LAB — POCT INR: INR: 2.2 (ref 2.0–3.0)

## 2020-06-27 NOTE — Patient Instructions (Signed)
continue taking 2 tablets daily except 3 tablets each Monday, Wednesday and Friday.  Recheck in 6 weeks.Call with any questions # [phone].  ° °BILL [id number] °

## 2020-07-19 ENCOUNTER — Other Ambulatory Visit: Payer: Self-pay

## 2020-07-19 MED ORDER — WARFARIN SODIUM 5 MG PO TABS: ORAL_TABLET | ORAL | 0 refills | Status: DC

## 2020-07-22 ENCOUNTER — Other Ambulatory Visit: Payer: Self-pay | Admitting: Cardiovascular Disease

## 2020-08-08 ENCOUNTER — Telehealth: Payer: Self-pay

## 2020-08-08 NOTE — Telephone Encounter (Signed)
For missed appt  

## 2020-08-15 ENCOUNTER — Ambulatory Visit (INDEPENDENT_AMBULATORY_CARE_PROVIDER_SITE_OTHER): Payer: Medicaid Other

## 2020-08-15 ENCOUNTER — Other Ambulatory Visit: Payer: Self-pay

## 2020-08-15 DIAGNOSIS — Z5181 Encounter for therapeutic drug level monitoring: Secondary | ICD-10-CM | POA: Diagnosis not present

## 2020-08-15 DIAGNOSIS — I240 Acute coronary thrombosis not resulting in myocardial infarction: Secondary | ICD-10-CM

## 2020-08-15 LAB — POCT INR: INR: 1.7 — AB (ref 2.0–3.0)

## 2020-08-15 NOTE — Patient Instructions (Signed)
Take 4 tablets today only and then continue taking 2 tablets daily except 3 tablets each Monday, Wednesday and Friday.  Recheck in 6 weeks. Call with any questions # [phone].   BILL [id number]

## 2020-08-29 ENCOUNTER — Other Ambulatory Visit: Payer: Self-pay | Admitting: Cardiovascular Disease

## 2020-09-02 ENCOUNTER — Other Ambulatory Visit: Payer: Self-pay | Admitting: Cardiovascular Disease

## 2020-09-02 DIAGNOSIS — I5043 Acute on chronic combined systolic (congestive) and diastolic (congestive) heart failure: Secondary | ICD-10-CM

## 2020-09-03 NOTE — Telephone Encounter (Signed)
Rx has been sent to the pharmacy electronically. ° °

## 2020-09-12 ENCOUNTER — Ambulatory Visit (INDEPENDENT_AMBULATORY_CARE_PROVIDER_SITE_OTHER): Payer: Medicaid Other

## 2020-09-12 ENCOUNTER — Other Ambulatory Visit: Payer: Self-pay

## 2020-09-12 DIAGNOSIS — I240 Acute coronary thrombosis not resulting in myocardial infarction: Secondary | ICD-10-CM

## 2020-09-12 DIAGNOSIS — Z5181 Encounter for therapeutic drug level monitoring: Secondary | ICD-10-CM

## 2020-09-12 LAB — POCT INR: INR: 2.1 (ref 2.0–3.0)

## 2020-09-12 MED ORDER — WARFARIN SODIUM 5 MG PO TABS: ORAL_TABLET | ORAL | 0 refills | Status: DC

## 2020-09-12 NOTE — Patient Instructions (Signed)
continue taking 2 tablets daily except 3 tablets each Monday, Wednesday and Friday.  Recheck in 6 weeks.Call with any questions # [phone].  ° °BILL [id number] °

## 2020-09-28 ENCOUNTER — Telehealth: Payer: Self-pay

## 2020-09-28 ENCOUNTER — Other Ambulatory Visit: Payer: Self-pay

## 2020-09-28 MED ORDER — WARFARIN SODIUM 5 MG PO TABS: ORAL_TABLET | ORAL | 1 refills | Status: DC

## 2020-10-24 ENCOUNTER — Telehealth: Payer: Self-pay

## 2020-10-24 NOTE — Telephone Encounter (Signed)
LMOM TO R/S MISSED APPT  

## 2020-10-29 ENCOUNTER — Ambulatory Visit (INDEPENDENT_AMBULATORY_CARE_PROVIDER_SITE_OTHER): Payer: Medicaid Other

## 2020-10-29 ENCOUNTER — Other Ambulatory Visit: Payer: Self-pay

## 2020-10-29 DIAGNOSIS — I240 Acute coronary thrombosis not resulting in myocardial infarction: Secondary | ICD-10-CM

## 2020-10-29 DIAGNOSIS — Z5181 Encounter for therapeutic drug level monitoring: Secondary | ICD-10-CM

## 2020-10-29 LAB — POCT INR: INR: 2.1 (ref 2.0–3.0)

## 2020-10-29 NOTE — Patient Instructions (Signed)
continue taking 2 tablets daily except 3 tablets each Monday, Wednesday and Friday.  Recheck in 6 weeks.Call with any questions # [phone].  ° °BILL [id number] °

## 2020-12-10 ENCOUNTER — Other Ambulatory Visit: Payer: Self-pay

## 2020-12-10 ENCOUNTER — Ambulatory Visit (INDEPENDENT_AMBULATORY_CARE_PROVIDER_SITE_OTHER): Payer: Medicaid Other

## 2020-12-10 DIAGNOSIS — Z5181 Encounter for therapeutic drug level monitoring: Secondary | ICD-10-CM

## 2020-12-10 DIAGNOSIS — Z7901 Long term (current) use of anticoagulants: Secondary | ICD-10-CM | POA: Diagnosis not present

## 2020-12-10 DIAGNOSIS — I240 Acute coronary thrombosis not resulting in myocardial infarction: Secondary | ICD-10-CM

## 2020-12-10 LAB — POCT INR: INR: 1.9 — AB (ref 2.0–3.0)

## 2020-12-10 NOTE — Patient Instructions (Addendum)
Take 4 tablets today only and then continue taking 2 tablets daily except 3 tablets each Monday, Wednesday and Friday.  Recheck in 4 weeks. Call with any questions # [phone].   BILL [id number]

## 2020-12-18 ENCOUNTER — Other Ambulatory Visit: Payer: Self-pay

## 2020-12-18 MED ORDER — WARFARIN SODIUM 5 MG PO TABS: ORAL_TABLET | ORAL | 0 refills | Status: DC

## 2020-12-18 NOTE — Telephone Encounter (Signed)
Prescription refill request received for warfarin 5mg .  Lov: cleaver 12/30/19 Next INR check: 8/22 Warfarin tablet strength:5mg  Warfarin schedule:continue taking 2 tablets daily except 3 tablets each Monday, Wednesday andFriday.

## 2021-01-07 ENCOUNTER — Telehealth: Payer: Self-pay

## 2021-01-07 NOTE — Telephone Encounter (Signed)
LMOM FOR MISSED APPT  

## 2021-01-15 ENCOUNTER — Telehealth: Payer: Self-pay

## 2021-01-15 NOTE — Telephone Encounter (Signed)
LMOM FOR OVERDUE INR 

## 2021-02-08 ENCOUNTER — Other Ambulatory Visit: Payer: Self-pay

## 2021-02-08 MED ORDER — WARFARIN SODIUM 5 MG PO TABS: ORAL_TABLET | ORAL | 0 refills | Status: DC

## 2021-02-11 ENCOUNTER — Ambulatory Visit (INDEPENDENT_AMBULATORY_CARE_PROVIDER_SITE_OTHER): Payer: Medicaid Other

## 2021-02-11 ENCOUNTER — Other Ambulatory Visit: Payer: Self-pay

## 2021-02-11 DIAGNOSIS — I240 Acute coronary thrombosis not resulting in myocardial infarction: Secondary | ICD-10-CM | POA: Diagnosis not present

## 2021-02-11 DIAGNOSIS — Z5181 Encounter for therapeutic drug level monitoring: Secondary | ICD-10-CM

## 2021-02-11 LAB — POCT INR: INR: 2.3 (ref 2.0–3.0)

## 2021-02-11 MED ORDER — WARFARIN SODIUM 5 MG PO TABS: ORAL_TABLET | ORAL | 1 refills | Status: DC

## 2021-02-11 NOTE — Patient Instructions (Signed)
continue taking 2 tablets daily except 3 tablets each Monday, Wednesday and Friday.  Recheck in 6 weeks.Call with any questions # [phone].  ° °BILL [id number] °

## 2021-03-11 ENCOUNTER — Other Ambulatory Visit: Payer: Self-pay | Admitting: Cardiovascular Disease

## 2021-03-25 ENCOUNTER — Telehealth: Payer: Self-pay

## 2021-03-25 NOTE — Telephone Encounter (Signed)
Lpmtcb and reschedule INR check 

## 2021-04-01 ENCOUNTER — Other Ambulatory Visit: Payer: Self-pay

## 2021-04-01 ENCOUNTER — Ambulatory Visit (INDEPENDENT_AMBULATORY_CARE_PROVIDER_SITE_OTHER): Payer: Medicaid Other | Admitting: *Deleted

## 2021-04-01 DIAGNOSIS — Z5181 Encounter for therapeutic drug level monitoring: Secondary | ICD-10-CM

## 2021-04-01 DIAGNOSIS — I240 Acute coronary thrombosis not resulting in myocardial infarction: Secondary | ICD-10-CM | POA: Diagnosis not present

## 2021-04-01 LAB — POCT INR: INR: 2.5 (ref 2.0–3.0)

## 2021-04-01 MED ORDER — WARFARIN SODIUM 5 MG PO TABS: ORAL_TABLET | ORAL | 1 refills | Status: DC

## 2021-04-01 NOTE — Progress Notes (Signed)
Prescription refill request received for warfarin Lov: 12/30/2019 Next INR check: 12/12 Warfarin tablet strength: 5mg   Pt sent to check out to make appointment with cardiologist because it has been over  ay ear since he has seen the cardiologist.

## 2021-04-01 NOTE — Patient Instructions (Signed)
Description   continue taking 2 tablets daily except 3 tablets each Monday, Wednesday and Friday.  Recheck in 4 weeks. Call with any questions # [phone].   BILL [id number]

## 2021-04-18 ENCOUNTER — Ambulatory Visit (INDEPENDENT_AMBULATORY_CARE_PROVIDER_SITE_OTHER): Payer: Medicaid Other | Admitting: Cardiovascular Disease

## 2021-04-18 ENCOUNTER — Other Ambulatory Visit: Payer: Self-pay

## 2021-04-18 ENCOUNTER — Encounter: Payer: Self-pay | Admitting: Cardiovascular Disease

## 2021-04-18 VITALS — BP 142/100 | HR 75 | Ht 71.0 in | Wt 323.6 lb

## 2021-04-18 DIAGNOSIS — I240 Acute coronary thrombosis not resulting in myocardial infarction: Secondary | ICD-10-CM

## 2021-04-18 DIAGNOSIS — N1831 Chronic kidney disease, stage 3a: Secondary | ICD-10-CM

## 2021-04-18 DIAGNOSIS — I5042 Chronic combined systolic (congestive) and diastolic (congestive) heart failure: Secondary | ICD-10-CM

## 2021-04-18 DIAGNOSIS — Z7901 Long term (current) use of anticoagulants: Secondary | ICD-10-CM | POA: Diagnosis not present

## 2021-04-18 DIAGNOSIS — E785 Hyperlipidemia, unspecified: Secondary | ICD-10-CM

## 2021-04-18 DIAGNOSIS — Z8673 Personal history of transient ischemic attack (TIA), and cerebral infarction without residual deficits: Secondary | ICD-10-CM

## 2021-04-18 DIAGNOSIS — I10 Essential (primary) hypertension: Secondary | ICD-10-CM

## 2021-04-18 NOTE — Progress Notes (Signed)
Patient ID: Shawn [name], male   DOB: 18-Sep-1985, 36 y.o.   MRN: [id number]    Cardiology Office Note    Date:  04/20/2021   ID:  Shawn [name], DOB 12-18-1985, MRN [id number]  PCP:  Pcp, No  Cardiologist:   Shawn Fair, MD   Chief Complaint  Patient presents with   Congestive Heart Failure    History of Present Illness:  Shawn [name] is a 35 y.o. 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  His blood pressure is high today, but he reports that he did not take his medications yet.  He reports rare episodes of shortness of breath with exertion (lifting very heavy loads), but he is able to continue doing a physical job.  Overall NYHA functional class I-2.  On his home scale his weight is typically 315-320 pounds.  He is doing the best he can to avoid high sodium foods, but often eats deli meats.  Occasionally skips his furosemide, no more than once a week, does develop edema when he does so.  He has not had problems with orthopnea or PND and denies dizziness, palpitations, syncope or any new neurological complaints.  He has recovered well from his previous stroke.  He has never had problems with chest pain.  Presented with heart failure and EF 35-40% in May 2016 in the setting of severe hypertension.  Had an embolic stroke July 2016 with LV thrombus seen on echo, EF 20% on echo in August 2016.  With medical therapy and anticoagulation, EF improved to 40-45% and LV thrombus resolved on echo January 2017.  Following a period of noncompliance with diet and medications, EF dropped down to 20-25% on echo in June 2019.  Developed DVT of the left lower extremity in July 2020, but while on treatment with Eliquis developed LV apical thrombus again, so  was restarted on warfarin.    Has not had any serious bleeding problems.  INR was 2.5 on 04/01/2021.  Past Medical History:  Diagnosis Date   At risk for sleep apnea    STOP-BANG= 4      SENT TO PCP 11-10-2014   CKD (chronic kidney disease), stage III (HCC)    Dyspnea on exertion    Hyperlipidemia    Hypertension    at age 58   Malignant hypertension    dx age 11   Nonischemic cardiomyopathy (HCC)    Penile adhesions    Systolic and diastolic CHF, chronic [facility]) dx 09-18-2014   cardiologist-  dr Rachelle Hora Francesco Provencal----  ef 25-30% per last note 11-03-2014    Past Surgical History:  Procedure Laterality Date   CIRCUMCISION REVISION N/A 11/14/2014   Procedure: CIRCUMCISION REVISION, LYSIS OF ADHESIONS;  Surgeon: Su Grand, MD;  Location: [facility];  Service: Urology;  Laterality: N/A;   NO PAST SURGERIES     TRANSTHORACIC ECHOCARDIOGRAM  09-19-2014  dr Jaylenn Altier   moderate LVH,  ef 35% (per dr Shamus Desantis note, ef 25-30%), diffuse hypokinesis of basal inferior myocardium,  trivial AR,  mild MR    Outpatient Medications Prior to Visit  Medication Sig Dispense Refill   carvedilol (COREG) 25 MG tablet TAKE 1 TABLET(25 MG) BY MOUTH TWICE DAILY WITH A MEAL 180 tablet 1   ENTRESTO 97-103 MG  TAKE 1 TABLET BY MOUTH TWICE DAILY 60 tablet 5   furosemide (LASIX) 40 MG tablet TAKE 1 TABLET(40 MG) BY MOUTH DAILY 90 tablet 3   oxyCODONE-acetaminophen (PERCOCET) 10-325 MG tablet Take by mouth daily in the afternoon.     simvastatin (ZOCOR) 20 MG tablet TAKE 1 TABLET(20 MG) BY MOUTH EVERY EVENING 90 tablet 1   warfarin (COUMADIN) 5 MG tablet TAKE 2 TO 3 TABLETS BY MOUTH DAILY AS DIRECTED BY COUMADIN CLINIC 75 tablet 1   acetaminophen (TYLENOL) 500 MG tablet Take 1,000 mg by mouth every 6 (six) hours as needed for mild pain. (Patient not taking: Reported on 04/18/2021)     baclofen (LIORESAL) 10 MG tablet Take 0.5-1 tablets (5-10 mg total) by mouth 3 (three) times daily as needed for muscle  spasms. (Patient not taking: Reported on 04/18/2021) 30 each 0   No facility-administered medications prior to visit.     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  Tobacco Use   Smoking status: Every Day    Packs/day: 0.50    Years: 10.00    Pack years: 5.00    Types: Cigarettes    Last attempt to quit: 04/19/2014    Years since quitting: 7.0   Smokeless tobacco: Never  Vaping Use   Vaping Use: Never used  Substance and Sexual Activity   Alcohol use: Yes    Alcohol/week: 0.0 standard drinks    Comment: 3 beers per week    Drug use: Yes    Types: Marijuana   Sexual activity: Yes  Other Topics Concern   Not on file  Social History Narrative   Not on file   Social Determinants of Health   Financial Resource Strain: Not on file  Food Insecurity: Not on file  Transportation Needs: Not on file  Physical Activity: Not on file  Stress: Not on file  Social Connections: Not on file     Family History:  The patient's family history includes Diabetes in his mother; Hypertension in his mother; Kidney disease (age of onset: 69) in his brother.   ROS:   Please see the history of present illness.    ROS All other systems are reviewed and are negative.   PHYSICAL EXAM:   VS:  BP (!) 142/100 (BP Location: Left Arm, Patient Position: Sitting, Cuff Size: Large)   Pulse 75   Ht 5\' 11"  (1.803 m)   Wt (!) 323 lb 9.6 oz (146.8 kg)   SpO2 100%   BMI 45.13 kg/m      Wt Readings from Last 3 Encounters:  04/18/21 (!) 323 lb 9.6 oz (146.8 kg)  12/30/19 (!) 333 lb 3.2 oz (151.1 kg)  07/01/19 (!) 333 lb 9.6 oz (151.3 kg)     General: Alert, oriented x3, no distress, morbidly obese Head: no evidence of trauma, PERRL, EOMI,  no exophtalmos or lid lag, no myxedema, no xanthelasma; normal ears, nose and oropharynx Neck: normal jugular venous pulsations and no hepatojugular reflux; brisk carotid pulses without delay and no carotid bruits Chest: clear to auscultation, no signs of consolidation by percussion or palpation, normal fremitus, symmetrical and full respiratory excursions Cardiovascular: normal position and quality of  the apical impulse, regular rhythm, normal first and second heart sounds, no murmurs, rubs or gallops Abdomen: no tenderness or distention, no masses by palpation, no abnormal pulsatility or arterial bruits, normal bowel sounds, no hepatosplenomegaly Extremities: no clubbing, cyanosis or edema; 2+ radial, ulnar and brachial pulses bilaterally; 2+ right femoral, posterior tibial and dorsalis pedis pulses; 2+ left femoral, posterior tibial and dorsalis pedis pulses; no subclavian or femoral bruits Neurological: grossly nonfocal Psych: Normal mood and affect    Studies/Labs Reviewed:   Echocardiogram  05/31/2019   1. Left ventricular ejection fraction, by visual estimation, is <20%. The  left ventricle has severely decreased function. There is severely  increased left ventricular hypertrophy.   2. Left ventricular diastolic parameters are consistent with Grade II  diastolic dysfunction (pseudonormalization).   3. Severely dilated left ventricular internal cavity size.   4. The left ventricle demonstrates global hypokinesis.   5. Global right ventricle has normal systolic function.The right  ventricular size is normal.   6. Left atrial size was mildly dilated.   7. Right atrial size was normal.   8. The mitral valve is normal in structure. Mild mitral valve  regurgitation. No evidence of mitral stenosis.   9. The tricuspid valve is normal in structure.  10. The aortic valve is tricuspid. Aortic valve regurgitation is not  visualized. No evidence of aortic valve sclerosis or stenosis.  11.  The pulmonic valve was normal in structure. Pulmonic valve  regurgitation is not visualized.  12. The inferior vena cava is normal in size with greater than 50%  respiratory variability, suggesting right atrial pressure of 3 mmHg.  13. Severe global reduction in LV systolic function; severe LVH; severe  LVE; no apical thrombus noted using definity; mild MR; mild LAE.   EKG:  EKG is ordered today.  Personally reviewed, shows normal sinus rhythm with a nonspecific intraventricular conduction abnormality (QRS 120 ms) and T wave inversion leads V5-V6, QTC 452 ms   Recent Labs: No results found for requested labs within last 8760 hours.   Lipid Panel    Component Value Date/Time   CHOL 174 11/18/2014 0133   TRIG 136 11/18/2014 0133   HDL 33 (L) 11/18/2014 0133   CHOLHDL 5.3 11/18/2014 0133   VLDL 27 11/18/2014 0133   LDLCALC 114 (H) 11/18/2014 0133     ASSESSMENT:    1. Chronic combined systolic and diastolic CHF (congestive heart failure) ([place])   2. Essential hypertension, malignant   3. History of embolic stroke   4. Left ventricular thrombus without MI ([place])   5. Long term (current) use of anticoagulants   6. Stage 3a chronic kidney disease ([place])   7. Morbid obesity ([place])   8. Hyperlipidemia, unspecified hyperlipidemia type      PLAN:  In order of problems listed above:  CHF: Nonischemic cardiomyopathy, at least in part related to malignant hypertension.  He has never undergone cardiac catheterization (due to renal dysfunction and need for anticoagulation) but has never had angina pectoris or ischemic ECG changes.  It is time to repeat his echo and reassess indication for ICD.  Appears clinically euvolemic and NYHA functional class I-2. On max dose Entresto and max usual dose of carvedilol.  Consider adding spironolactone (although he is functional class I-2 ) and/or SGLT2 inhibitor.  Check labs first. HTN: Blood pressure was high today, but at his previous visits has been  around 130/80 fairly consistently. History of LV thrombus: Recurrent left ventricular thrombus, resolved after switching to  warfarin.  Barring marked improvement in LV systolic function, this will probably a lifelong indication. Anticoagulation: INR in therapeutic range a couple of weeks ago.  No bleeding problems. CKD 3a: Needs repeat labs.  Baseline creatinine seems to be around 1.8 (GFR 50). Morbid obesity: In the long run, it is critical that he lose weight.  Notwithstanding the "obesity paradox", this degree of obesity will have serious harmful effects on him.  No overt hypersomnolence or other signs of sleep apnea Dyslipidemia: HDL was only 33 on labs in 2016, although total and LDL cholesterol were not that high.   Medication Adjustments/Labs and Tests Ordered: Current medicines are reviewed at length with the patient today.  Concerns regarding medicines are outlined above.  Medication changes, Labs and Tests ordered today are listed in the Patient Instructions below. Patient Instructions  Medication Instructions:  No changes *If you need a refill on your cardiac medications before your next appointment, please call your pharmacy*   Lab Work: Your provider would like for you to have the following labs today: CMET, BNP, and Lipid  If you have labs (blood work) drawn today and your tests are completely normal, you will receive your results only by: [place] (if you have MyChart) OR A paper copy in the mail If you have any lab test that is abnormal or we need to change your treatment, we will call you to review the results.   Testing/Procedures: Your physician has requested that you have an echocardiogram. Echocardiography is a painless test that uses sound waves to create images of your heart. It provides your doctor with information about the size and shape of your heart and how well your heart's chambers and valves are working. You may receive an ultrasound enhancing agent  through an IV if needed to better visualize your heart during the echo.This procedure takes approximately one hour. There are no restrictions for this procedure. This will take place at the [name], [address].     Follow-Up: At [facility], you and your health needs are our priority.  As part of our continuing mission to provide you with exceptional heart care, we have created designated Provider Care Teams.  These Care Teams include your primary Cardiologist (physician) and Advanced Practice Providers (APPs -  Physician Assistants and Nurse Practitioners) who all work together to provide you with the care you need, when you need it.  We recommend signing up for the patient portal called "MyChart".  Sign up information is provided on this After Visit Summary.  MyChart is used to connect with patients for Virtual Visits (Telemedicine).  Patients are able to view lab/test results, encounter notes, upcoming appointments, etc.  Non-urgent messages can be sent to your provider as well.   To learn more about what you can do with MyChart, go to [email or web address].    Your next appointment:   12 month(s)  The format for your next appointment:   In Person  Provider:   Sanda Klein, MD            Signed, Sanda Klein, MD  04/20/2021 1:00 PM    [address], [place], [place]  [address] Phone: [phone]; Fax: [phone]

## 2021-04-18 NOTE — Patient Instructions (Signed)
Medication Instructions:  No changes *If you need a refill on your cardiac medications before your next appointment, please call your pharmacy*   Lab Work: Your provider would like for you to have the following labs today: CMET, BNP, and Lipid  If you have labs (blood work) drawn today and your tests are completely normal, you will receive your results only by: MyChart Message (if you have MyChart) OR A paper copy in the mail If you have any lab test that is abnormal or we need to change your treatment, we will call you to review the results.   Testing/Procedures: Your physician has requested that you have an echocardiogram. Echocardiography is a painless test that uses sound waves to create images of your heart. It provides your doctor with information about the size and shape of your heart and how well your heart's chambers and valves are working. You may receive an ultrasound enhancing agent through an IV if needed to better visualize your heart during the echo.This procedure takes approximately one hour. There are no restrictions for this procedure. This will take place at the [address].     Follow-Up: At [facility], you and your health needs are our priority.  As part of our continuing mission to provide you with exceptional heart care, we have created designated Provider Care Teams.  These Care Teams include your primary Cardiologist (physician) and Advanced Practice Providers (APPs -  Physician Assistants and Nurse Practitioners) who all work together to provide you with the care you need, when you need it.  We recommend signing up for the patient portal called "MyChart".  Sign up information is provided on this After Visit Summary.  MyChart is used to connect with patients for Virtual Visits (Telemedicine).  Patients are able to view lab/test results, encounter notes, upcoming appointments, etc.  Non-urgent messages can be sent to your provider as well.   To  learn more about what you can do with MyChart, go to [email or web address].    Your next appointment:   12 month(s)  The format for your next appointment:   In Person  Provider:   Thurmon Fair, MD

## 2021-04-20 ENCOUNTER — Encounter: Payer: Self-pay | Admitting: Cardiovascular Disease

## 2021-04-22 ENCOUNTER — Telehealth: Payer: Self-pay

## 2021-04-22 ENCOUNTER — Other Ambulatory Visit: Payer: Self-pay

## 2021-04-22 MED ORDER — SIMVASTATIN 20 MG PO TABS: ORAL_TABLET | ORAL | 1 refills | Status: DC

## 2021-04-22 NOTE — Telephone Encounter (Signed)
Faxed refill request received from [facility] [address] for Simvastatin 20 mg one tab po qpm. Medication refilled as directed. Pts last ov was 04/19/21.

## 2021-05-06 ENCOUNTER — Other Ambulatory Visit: Payer: Self-pay

## 2021-05-06 ENCOUNTER — Ambulatory Visit (INDEPENDENT_AMBULATORY_CARE_PROVIDER_SITE_OTHER): Payer: Medicaid Other

## 2021-05-06 DIAGNOSIS — Z5181 Encounter for therapeutic drug level monitoring: Secondary | ICD-10-CM

## 2021-05-06 DIAGNOSIS — I240 Acute coronary thrombosis not resulting in myocardial infarction: Secondary | ICD-10-CM | POA: Diagnosis not present

## 2021-05-06 LAB — POCT INR: INR: 2.2 (ref 2.0–3.0)

## 2021-05-06 NOTE — Patient Instructions (Signed)
continue taking 2 tablets daily except 3 tablets each Monday, Wednesday and Friday.  Recheck in 6 weeks.Call with any questions # [phone].   BILL [address]

## 2021-05-21 ENCOUNTER — Ambulatory Visit (HOSPITAL_COMMUNITY): Payer: Medicaid Other | Attending: Cardiovascular Disease

## 2021-05-21 ENCOUNTER — Other Ambulatory Visit: Payer: Self-pay

## 2021-05-21 DIAGNOSIS — I240 Acute coronary thrombosis not resulting in myocardial infarction: Secondary | ICD-10-CM | POA: Insufficient documentation

## 2021-05-21 DIAGNOSIS — I5042 Chronic combined systolic (congestive) and diastolic (congestive) heart failure: Secondary | ICD-10-CM | POA: Diagnosis not present

## 2021-05-21 LAB — ECHOCARDIOGRAM COMPLETE
Area-P 1/2: 3.77 cm2
S' Lateral: 6.4 cm

## 2021-05-21 MED ORDER — PERFLUTREN LIPID MICROSPHERE
1.0000 mL | INTRAVENOUS | Status: AC | PRN
  Administered 2021-05-21: 3 mL via INTRAVENOUS

## 2021-05-29 ENCOUNTER — Telehealth: Payer: Self-pay | Admitting: Cardiovascular Disease

## 2021-05-29 DIAGNOSIS — I5042 Chronic combined systolic (congestive) and diastolic (congestive) heart failure: Secondary | ICD-10-CM

## 2021-05-29 DIAGNOSIS — Z5181 Encounter for therapeutic drug level monitoring: Secondary | ICD-10-CM

## 2021-05-29 MED ORDER — DAPAGLIFLOZIN PROPANEDIOL 10 MG PO TABS: 10.0000 mg | ORAL_TABLET | Freq: Every day | ORAL | 11 refills | Status: DC

## 2021-05-29 MED ORDER — SPIRONOLACTONE 25 MG PO TABS: 12.5000 mg | ORAL_TABLET | Freq: Every day | ORAL | 11 refills | Status: DC

## 2021-05-29 NOTE — Telephone Encounter (Signed)
° °  Pt is returning call to get echo result °

## 2021-05-29 NOTE — Telephone Encounter (Signed)
Patient made aware of results and verbalized understanding.  Echo results:   Thurmon Fair, MD  05/27/2021  8:26 AM EST     And check BMET in 14 days   Thurmon Fair, MD  05/27/2021  8:26 AM EST     On maximum dose Entresto and carvedilol. Please add spironolactone 12.5 mg daily and add farxiga 10 mg daily and refer to EP for ICD discussion.

## 2021-05-31 ENCOUNTER — Telehealth: Payer: Self-pay

## 2021-05-31 NOTE — Telephone Encounter (Signed)
**Note De-Identified Anias Bartol Obfuscation** Wilder Glade PA started through covermymesand message received:  Maryan Char Key: [facility] - PA Case ID: [id number] - Rx #: [id number] Outcome: Approved today FARXIGA TAB 10MG  is approved through 05/31/2022.  Drug: Wilder Glade 10MG  tablets Form [employer] Electronic Prior Authorization Form (2017 NCPDP)   I have notified [place] of this approval.

## 2021-06-03 ENCOUNTER — Other Ambulatory Visit: Payer: Self-pay | Admitting: Cardiovascular Disease

## 2021-06-17 ENCOUNTER — Other Ambulatory Visit: Payer: Self-pay

## 2021-06-17 ENCOUNTER — Ambulatory Visit (INDEPENDENT_AMBULATORY_CARE_PROVIDER_SITE_OTHER): Payer: Medicaid Other

## 2021-06-17 DIAGNOSIS — Z5181 Encounter for therapeutic drug level monitoring: Secondary | ICD-10-CM

## 2021-06-17 DIAGNOSIS — I240 Acute coronary thrombosis not resulting in myocardial infarction: Secondary | ICD-10-CM | POA: Diagnosis not present

## 2021-06-17 DIAGNOSIS — I5042 Chronic combined systolic (congestive) and diastolic (congestive) heart failure: Secondary | ICD-10-CM | POA: Diagnosis not present

## 2021-06-17 LAB — POCT INR: INR: 2 (ref 2.0–3.0)

## 2021-06-17 MED ORDER — WARFARIN SODIUM 5 MG PO TABS: ORAL_TABLET | ORAL | 0 refills | Status: DC

## 2021-06-17 NOTE — Patient Instructions (Signed)
continue taking 2 tablets daily except 3 tablets each Monday, Wednesday and Friday.  Recheck in 6 weeks.Call with any questions # [phone].   BILL [address]

## 2021-06-18 LAB — BASIC METABOLIC PANEL
BUN/Creatinine Ratio: 18 (ref 9–20)
BUN: 31 mg/dL — ABNORMAL HIGH (ref 6–20)
CO2: 21 mmol/L (ref 20–29)
Calcium: 9.3 mg/dL (ref 8.7–10.2)
Chloride: 104 mmol/L (ref 96–106)
Creatinine, Ser: 1.77 mg/dL — ABNORMAL HIGH (ref 0.76–1.27)
Glucose: 78 mg/dL (ref 70–99)
Potassium: 4.6 mmol/L (ref 3.5–5.2)
Sodium: 139 mmol/L (ref 134–144)
eGFR: 50 mL/min/{1.73_m2} — ABNORMAL LOW (ref >59)

## 2021-06-19 ENCOUNTER — Other Ambulatory Visit: Payer: Self-pay

## 2021-06-19 ENCOUNTER — Encounter: Payer: Self-pay | Admitting: *Deleted

## 2021-06-19 DIAGNOSIS — I5043 Acute on chronic combined systolic (congestive) and diastolic (congestive) heart failure: Secondary | ICD-10-CM

## 2021-06-19 MED ORDER — FUROSEMIDE 40 MG PO TABS: ORAL_TABLET | ORAL | 3 refills | Status: DC

## 2021-07-04 ENCOUNTER — Encounter: Payer: Self-pay | Admitting: Internal Medicine

## 2021-07-04 ENCOUNTER — Other Ambulatory Visit: Payer: Self-pay

## 2021-07-04 ENCOUNTER — Ambulatory Visit (INDEPENDENT_AMBULATORY_CARE_PROVIDER_SITE_OTHER): Payer: Medicaid Other | Admitting: Internal Medicine

## 2021-07-04 VITALS — BP 142/78 | HR 74 | Ht 71.0 in | Wt 316.2 lb

## 2021-07-04 DIAGNOSIS — R0600 Dyspnea, unspecified: Secondary | ICD-10-CM

## 2021-07-04 DIAGNOSIS — I428 Other cardiomyopathies: Secondary | ICD-10-CM | POA: Diagnosis not present

## 2021-07-04 DIAGNOSIS — I5042 Chronic combined systolic (congestive) and diastolic (congestive) heart failure: Secondary | ICD-10-CM

## 2021-07-04 DIAGNOSIS — R911 Solitary pulmonary nodule: Secondary | ICD-10-CM | POA: Diagnosis not present
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
## 2021-07-04 NOTE — Patient Instructions (Addendum)
Medication Instructions:   Your physician recommends that you continue on your current medications as directed. Please refer to the Current Medication list given to you today.  *If you need a refill on your cardiac medications before your next appointment, please call your pharmacy*   Lab Work: None ordered.  If you have labs (blood work) drawn today and your tests are completely normal, you will receive your results only by: [place] (if you have MyChart) OR A paper copy in the mail If you have any lab test that is abnormal or we need to change your treatment, we will call you to review the results.   Testing/Procedures: Your physician has recommended that you have a cardiopulmonary stress test (CPX). CPX testing is a non-invasive measurement of heart and lung function. It replaces a traditional treadmill stress test. This type of test provides a tremendous amount of information that relates not only to your present condition but also for future outcomes. This test combines measurements of you ventilation, respiratory gas exchange in the lungs, electrocardiogram (EKG), blood pressure and physical response before, during, and following an exercise protocol.     Follow-Up: At [facility], you and your health needs are our priority.  As part of our continuing mission to provide you with exceptional heart care, we have created designated Provider Care Teams.  These Care Teams include your primary Cardiologist (physician) and Advanced Practice Providers (APPs -  Physician Assistants and Nurse Practitioners) who all work together to provide you with the care you need, when you need it.  We recommend signing up for the patient portal called "MyChart".  Sign up information is provided on this After Visit Summary.  MyChart is used to connect with patients for Virtual Visits (Telemedicine).  Patients are able to view lab/test results, encounter notes, upcoming appointments, etc.  Non-urgent  messages can be sent to your provider as well.   To learn more about what you can do with MyChart, go to [email or web address].    Your next appointment:   Follow up will be determined after testing

## 2021-07-16 ENCOUNTER — Ambulatory Visit (HOSPITAL_COMMUNITY): Payer: Medicaid Other | Attending: Internal Medicine

## 2021-07-16 ENCOUNTER — Other Ambulatory Visit: Payer: Self-pay

## 2021-07-16 DIAGNOSIS — I130 Hypertensive heart and chronic kidney disease with heart failure and stage 1 through stage 4 chronic kidney disease, or unspecified chronic kidney disease: Secondary | ICD-10-CM | POA: Diagnosis present

## 2021-07-16 DIAGNOSIS — N183 Chronic kidney disease, stage 3 unspecified: Secondary | ICD-10-CM | POA: Insufficient documentation

## 2021-07-16 DIAGNOSIS — E785 Hyperlipidemia, unspecified: Secondary | ICD-10-CM | POA: Insufficient documentation

## 2021-07-16 DIAGNOSIS — N475 Adhesions of prepuce and glans penis: Secondary | ICD-10-CM | POA: Insufficient documentation

## 2021-07-16 DIAGNOSIS — R0600 Dyspnea, unspecified: Secondary | ICD-10-CM | POA: Diagnosis not present

## 2021-07-16 DIAGNOSIS — I5042 Chronic combined systolic (congestive) and diastolic (congestive) heart failure: Secondary | ICD-10-CM | POA: Diagnosis not present

## 2021-07-16 DIAGNOSIS — I428 Other cardiomyopathies: Secondary | ICD-10-CM

## 2021-07-22 ENCOUNTER — Other Ambulatory Visit: Payer: Self-pay

## 2021-07-22 ENCOUNTER — Other Ambulatory Visit: Payer: Medicaid Other

## 2021-07-22 DIAGNOSIS — I428 Other cardiomyopathies: Secondary | ICD-10-CM

## 2021-07-22 DIAGNOSIS — I5042 Chronic combined systolic (congestive) and diastolic (congestive) heart failure: Secondary | ICD-10-CM | POA: Diagnosis not present

## 2021-07-22 DIAGNOSIS — R911 Solitary pulmonary nodule: Secondary | ICD-10-CM

## 2021-07-22 DIAGNOSIS — E785 Hyperlipidemia, unspecified: Secondary | ICD-10-CM | POA: Diagnosis not present

## 2021-07-22 DIAGNOSIS — Z01812 Encounter for preprocedural laboratory examination: Secondary | ICD-10-CM

## 2021-07-22 DIAGNOSIS — I10 Essential (primary) hypertension: Secondary | ICD-10-CM

## 2021-07-22 LAB — LIPID PANEL

## 2021-07-22 NOTE — Progress Notes (Signed)
Pt will need BMET prior to CT per Dr Graciela Husbands. ?

## 2021-07-23 LAB — COMPREHENSIVE METABOLIC PANEL
ALT: 22 IU/L (ref 0–44)
AST: 20 IU/L (ref 0–40)
Albumin/Globulin Ratio: 1.4 (ref 1.2–2.2)
Albumin: 4 g/dL (ref 4.0–5.0)
Alkaline Phosphatase: 62 IU/L (ref 44–121)
BUN/Creatinine Ratio: 16 (ref 9–20)
BUN: 26 mg/dL — ABNORMAL HIGH (ref 6–20)
Bilirubin Total: 0.4 mg/dL (ref 0.0–1.2)
CO2: 20 mmol/L (ref 20–29)
Calcium: 9.1 mg/dL (ref 8.7–10.2)
Chloride: 106 mmol/L (ref 96–106)
Creatinine, Ser: 1.65 mg/dL — ABNORMAL HIGH (ref 0.76–1.27)
Globulin, Total: 2.8 g/dL (ref 1.5–4.5)
Glucose: 100 mg/dL — ABNORMAL HIGH (ref 70–99)
Potassium: 4.7 mmol/L (ref 3.5–5.2)
Sodium: 138 mmol/L (ref 134–144)
Total Protein: 6.8 g/dL (ref 6.0–8.5)
eGFR: 55 mL/min/{1.73_m2} — ABNORMAL LOW (ref >59)

## 2021-07-23 LAB — LIPID PANEL
Chol/HDL Ratio: 5.4 ratio — ABNORMAL HIGH (ref 0.0–5.0)
Cholesterol, Total: 172 mg/dL (ref 100–199)
HDL: 32 mg/dL — ABNORMAL LOW (ref >39)
LDL Chol Calc (NIH): 95 mg/dL (ref 0–99)
Triglycerides: 265 mg/dL — ABNORMAL HIGH (ref 0–149)
VLDL Cholesterol Cal: 45 mg/dL — ABNORMAL HIGH (ref 5–40)

## 2021-07-23 LAB — BRAIN NATRIURETIC PEPTIDE: BNP: 13.6 pg/mL (ref 0.0–100.0)

## 2021-07-24 ENCOUNTER — Encounter: Payer: Self-pay | Admitting: *Deleted

## 2021-07-25 ENCOUNTER — Other Ambulatory Visit: Payer: Self-pay

## 2021-07-25 ENCOUNTER — Ambulatory Visit (HOSPITAL_COMMUNITY)
Admission: RE | Admit: 2021-07-25 | Discharge: 2021-07-25 | Disposition: A | Discharge status: Home / Self Care | Payer: Medicaid Other | Source: Ambulatory Visit | Attending: Internal Medicine | Admitting: Internal Medicine

## 2021-07-25 DIAGNOSIS — R911 Solitary pulmonary nodule: Secondary | ICD-10-CM | POA: Diagnosis not present

## 2021-07-29 ENCOUNTER — Other Ambulatory Visit: Payer: Self-pay

## 2021-07-29 ENCOUNTER — Ambulatory Visit (INDEPENDENT_AMBULATORY_CARE_PROVIDER_SITE_OTHER): Payer: Medicaid Other

## 2021-07-29 DIAGNOSIS — Z5181 Encounter for therapeutic drug level monitoring: Secondary | ICD-10-CM | POA: Diagnosis not present

## 2021-07-29 DIAGNOSIS — I240 Acute coronary thrombosis not resulting in myocardial infarction: Secondary | ICD-10-CM | POA: Diagnosis not present

## 2021-07-29 LAB — POCT INR: INR: 2.6 (ref 2.0–3.0)

## 2021-07-29 NOTE — Patient Instructions (Signed)
continue taking 2 tablets daily except 3 tablets each Monday, Wednesday and Friday.  Recheck in 6 weeks.Call with any questions # [phone].  ° °BILL [id number] °

## 2021-08-04 ENCOUNTER — Other Ambulatory Visit: Payer: Self-pay | Admitting: Cardiovascular Disease

## 2021-08-05 ENCOUNTER — Telehealth: Payer: Self-pay

## 2021-08-05 NOTE — Telephone Encounter (Signed)
Lmom to r/s as we will be closed on 4/26 due to staffing issues ?

## 2021-08-13 ENCOUNTER — Other Ambulatory Visit: Payer: Self-pay

## 2021-08-13 DIAGNOSIS — I5043 Acute on chronic combined systolic (congestive) and diastolic (congestive) heart failure: Secondary | ICD-10-CM

## 2021-08-13 MED ORDER — CARVEDILOL 25 MG PO TABS: ORAL_TABLET | ORAL | 1 refills | Status: DC

## 2021-08-27 ENCOUNTER — Telehealth: Payer: Self-pay

## 2021-08-27 DIAGNOSIS — I428 Other cardiomyopathies: Secondary | ICD-10-CM

## 2021-08-27 DIAGNOSIS — I5042 Chronic combined systolic (congestive) and diastolic (congestive) heart failure: Secondary | ICD-10-CM

## 2021-08-27 NOTE — Telephone Encounter (Signed)
Spoke with pt and advised of Dr Odessa Fleming comments below: ?Please Inform Patient that CPX looks pretty good but the MDs were concerned that there may be more to this and so recommeded CHF clinic eval  That would make good sense so we can best understand what he can and cant do and also then whether it makes sense to consider an ICD  ?Pt verbalized understanding and is agreeable to CHF clinic referral. ?

## 2021-08-27 NOTE — Telephone Encounter (Signed)
-----   Message from Duke Salvia, MD sent at 08/15/2021  5:31 PM EDT ----- ?Please Inform Patient that CPX looks pretty good but the MDs were concerned that there may be more to this and so recommeded CHF clinic eval  That would make good sense so we can best understand what he can and cant do and also then whether it makes sense to consider an ICD  ? ?Thanks ? ?

## 2021-09-05 ENCOUNTER — Other Ambulatory Visit: Payer: Self-pay | Admitting: Cardiovascular Disease

## 2021-09-12 ENCOUNTER — Ambulatory Visit (INDEPENDENT_AMBULATORY_CARE_PROVIDER_SITE_OTHER): Payer: Medicaid Other

## 2021-09-12 DIAGNOSIS — Z5181 Encounter for therapeutic drug level monitoring: Secondary | ICD-10-CM

## 2021-09-12 DIAGNOSIS — I240 Acute coronary thrombosis not resulting in myocardial infarction: Secondary | ICD-10-CM | POA: Diagnosis not present

## 2021-09-12 LAB — POCT INR: INR: 2.1 (ref 2.0–3.0)

## 2021-09-12 NOTE — Patient Instructions (Signed)
continue taking 2 tablets daily except 3 tablets each Monday, Wednesday and Friday.  Recheck in 6 weeks.Call with any questions # [phone].  ° °BILL [id number] °

## 2021-10-03 ENCOUNTER — Other Ambulatory Visit: Payer: Self-pay | Admitting: Cardiovascular Disease

## 2021-10-05 ENCOUNTER — Other Ambulatory Visit: Payer: Self-pay | Admitting: Cardiovascular Disease

## 2021-10-24 ENCOUNTER — Ambulatory Visit (INDEPENDENT_AMBULATORY_CARE_PROVIDER_SITE_OTHER): Payer: Medicaid Other

## 2021-10-24 DIAGNOSIS — Z5181 Encounter for therapeutic drug level monitoring: Secondary | ICD-10-CM | POA: Diagnosis not present

## 2021-10-24 DIAGNOSIS — I240 Acute coronary thrombosis not resulting in myocardial infarction: Secondary | ICD-10-CM | POA: Diagnosis not present

## 2021-10-24 LAB — POCT INR: INR: 2.5 (ref 2.0–3.0)

## 2021-10-24 MED ORDER — WARFARIN SODIUM 5 MG PO TABS: ORAL_TABLET | ORAL | 0 refills | Status: DC

## 2021-10-24 NOTE — Patient Instructions (Signed)
continue taking 2 tablets daily except 3 tablets each Monday, Wednesday and Friday.  Recheck in 6 weeks.Call with any questions # [phone].  ° °BILL [id number] °

## 2021-11-15 ENCOUNTER — Telehealth (HOSPITAL_COMMUNITY): Payer: Self-pay | Admitting: Vascular Surgery

## 2021-11-15 NOTE — Telephone Encounter (Signed)
Lvm to make new pt app w/ db

## 2021-11-20 ENCOUNTER — Telehealth (HOSPITAL_COMMUNITY): Payer: Self-pay | Admitting: Vascular Surgery

## 2021-11-20 NOTE — Telephone Encounter (Signed)
2 nd attempt to reach pt to make new chf appt w/ db

## 2021-11-29 ENCOUNTER — Other Ambulatory Visit: Payer: Self-pay

## 2021-11-29 DIAGNOSIS — I240 Acute coronary thrombosis not resulting in myocardial infarction: Secondary | ICD-10-CM

## 2021-11-29 DIAGNOSIS — Z5181 Encounter for therapeutic drug level monitoring: Secondary | ICD-10-CM

## 2021-11-29 MED ORDER — WARFARIN SODIUM 5 MG PO TABS: ORAL_TABLET | ORAL | 0 refills | Status: DC

## 2021-12-24 ENCOUNTER — Other Ambulatory Visit: Payer: Self-pay | Admitting: Cardiovascular Disease

## 2021-12-24 DIAGNOSIS — Z5181 Encounter for therapeutic drug level monitoring: Secondary | ICD-10-CM

## 2021-12-24 DIAGNOSIS — I240 Acute coronary thrombosis not resulting in myocardial infarction: Secondary | ICD-10-CM

## 2021-12-25 ENCOUNTER — Ambulatory Visit (INDEPENDENT_AMBULATORY_CARE_PROVIDER_SITE_OTHER): Payer: Medicaid Other | Admitting: *Deleted

## 2021-12-25 DIAGNOSIS — Z5181 Encounter for therapeutic drug level monitoring: Secondary | ICD-10-CM | POA: Diagnosis not present

## 2021-12-25 DIAGNOSIS — I240 Acute coronary thrombosis not resulting in myocardial infarction: Secondary | ICD-10-CM | POA: Diagnosis not present

## 2021-12-25 LAB — POCT INR: INR: 1.8 — AB (ref 2.0–3.0)

## 2021-12-25 NOTE — Patient Instructions (Signed)
Description   Today take 4 tablets and tomorrow take 3 tablets then continue taking 2 tablets daily except 3 tablets each Monday, Wednesday, and Friday.  Recheck in 4 weeks. Call with any questions # [phone].   BILL [id number]

## 2022-01-21 ENCOUNTER — Ambulatory Visit: Payer: Medicaid Other | Attending: Cardiology

## 2022-01-21 DIAGNOSIS — Z5181 Encounter for therapeutic drug level monitoring: Secondary | ICD-10-CM

## 2022-01-21 DIAGNOSIS — I240 Acute coronary thrombosis not resulting in myocardial infarction: Secondary | ICD-10-CM

## 2022-01-21 LAB — POCT INR: INR: 3.3 — AB (ref 2.0–3.0)

## 2022-01-21 MED ORDER — WARFARIN SODIUM 5 MG PO TABS: ORAL_TABLET | ORAL | 0 refills | Status: DC

## 2022-01-21 NOTE — Patient Instructions (Signed)
continue taking 2 tablets daily except 3 tablets each Monday, Wednesday, and Friday.  Recheck in 4 weeks. Call with any questions # [phone]. Eat greens tonight  BILL [id number]

## 2022-02-16 ENCOUNTER — Other Ambulatory Visit: Payer: Self-pay | Admitting: Cardiovascular Disease

## 2022-02-16 DIAGNOSIS — I5043 Acute on chronic combined systolic (congestive) and diastolic (congestive) heart failure: Secondary | ICD-10-CM

## 2022-02-18 ENCOUNTER — Ambulatory Visit: Payer: Medicaid Other | Attending: Cardiovascular Disease

## 2022-02-18 DIAGNOSIS — Z5181 Encounter for therapeutic drug level monitoring: Secondary | ICD-10-CM

## 2022-02-18 DIAGNOSIS — I240 Acute coronary thrombosis not resulting in myocardial infarction: Secondary | ICD-10-CM | POA: Diagnosis not present

## 2022-02-18 LAB — POCT INR: INR: 2.3 (ref 2.0–3.0)

## 2022-02-18 MED ORDER — WARFARIN SODIUM 5 MG PO TABS: ORAL_TABLET | ORAL | 0 refills | Status: DC

## 2022-02-18 NOTE — Patient Instructions (Signed)
continue taking 2 tablets daily except 3 tablets each Monday, Wednesday, and Friday.  Recheck in 6 weeks. Call with any questions # [phone].   BILL [id number]

## 2022-02-26 ENCOUNTER — Encounter (HOSPITAL_COMMUNITY): Payer: Self-pay | Admitting: Internal Medicine

## 2022-02-26 ENCOUNTER — Ambulatory Visit (HOSPITAL_COMMUNITY)
Admission: RE | Admit: 2022-02-26 | Discharge: 2022-02-26 | Disposition: A | Discharge status: Home / Self Care | Payer: Medicaid Other | Source: Ambulatory Visit | Attending: Internal Medicine | Admitting: Internal Medicine

## 2022-02-26 VITALS — BP 148/88 | HR 71 | Wt 306.6 lb

## 2022-02-26 DIAGNOSIS — I428 Other cardiomyopathies: Secondary | ICD-10-CM | POA: Diagnosis not present

## 2022-02-26 DIAGNOSIS — Z8673 Personal history of transient ischemic attack (TIA), and cerebral infarction without residual deficits: Secondary | ICD-10-CM | POA: Diagnosis not present

## 2022-02-26 DIAGNOSIS — I130 Hypertensive heart and chronic kidney disease with heart failure and stage 1 through stage 4 chronic kidney disease, or unspecified chronic kidney disease: Secondary | ICD-10-CM | POA: Insufficient documentation

## 2022-02-26 DIAGNOSIS — Z7901 Long term (current) use of anticoagulants: Secondary | ICD-10-CM | POA: Diagnosis not present

## 2022-02-26 DIAGNOSIS — Z86718 Personal history of other venous thrombosis and embolism: Secondary | ICD-10-CM | POA: Diagnosis not present

## 2022-02-26 DIAGNOSIS — I10 Essential (primary) hypertension: Secondary | ICD-10-CM

## 2022-02-26 DIAGNOSIS — I5042 Chronic combined systolic (congestive) and diastolic (congestive) heart failure: Secondary | ICD-10-CM | POA: Insufficient documentation

## 2022-02-26 DIAGNOSIS — Z6841 Body Mass Index (BMI) 40.0 and over, adult: Secondary | ICD-10-CM | POA: Diagnosis not present

## 2022-02-26 DIAGNOSIS — N183 Chronic kidney disease, stage 3 unspecified: Secondary | ICD-10-CM | POA: Diagnosis not present

## 2022-02-26 DIAGNOSIS — I513 Intracardiac thrombosis, not elsewhere classified: Secondary | ICD-10-CM | POA: Diagnosis not present

## 2022-02-26 DIAGNOSIS — R0683 Snoring: Secondary | ICD-10-CM | POA: Insufficient documentation

## 2022-02-26 DIAGNOSIS — G4719 Other hypersomnia: Secondary | ICD-10-CM | POA: Diagnosis not present

## 2022-02-26 DIAGNOSIS — Z79899 Other long term (current) drug therapy: Secondary | ICD-10-CM | POA: Diagnosis not present

## 2022-02-26 DIAGNOSIS — N1831 Chronic kidney disease, stage 3a: Secondary | ICD-10-CM | POA: Insufficient documentation

## 2022-02-26 DIAGNOSIS — Z7984 Long term (current) use of oral hypoglycemic drugs: Secondary | ICD-10-CM | POA: Insufficient documentation

## 2022-02-26 LAB — BASIC METABOLIC PANEL
Anion gap: 7 (ref 5–15)
BUN: 24 mg/dL — ABNORMAL HIGH (ref 6–20)
CO2: 21 mmol/L — ABNORMAL LOW (ref 22–32)
Calcium: 8.7 mg/dL — ABNORMAL LOW (ref 8.9–10.3)
Chloride: 110 mmol/L (ref 98–111)
Creatinine, Ser: 1.73 mg/dL — ABNORMAL HIGH (ref 0.61–1.24)
GFR, Estimated: 51 mL/min — ABNORMAL LOW (ref >60)
Glucose, Bld: 104 mg/dL — ABNORMAL HIGH (ref 70–99)
Potassium: 3.8 mmol/L (ref 3.5–5.1)
Sodium: 138 mmol/L (ref 135–145)

## 2022-02-26 LAB — BRAIN NATRIURETIC PEPTIDE: B Natriuretic Peptide: 11 pg/mL (ref 0.0–100.0)

## 2022-02-26 MED ORDER — SPIRONOLACTONE 25 MG PO TABS: 12.5000 mg | ORAL_TABLET | Freq: Every day | ORAL | 11 refills | Status: DC

## 2022-02-26 NOTE — Progress Notes (Signed)
Height:  5'11"    Weight: 306 lb BMI: 42.76  Today's Date: 02/26/22  STOP BANG RISK ASSESSMENT S (snore) Have you been told that you snore?     YES   T (tired) Are you often tired, fatigued, or sleepy during the day?   YES  O (obstruction) Do you stop breathing, choke, or gasp during sleep? NO   P (pressure) Do you have or are you being treated for high blood pressure? YES   B (BMI) Is your body index greater than 35 kg/m? YES   A (age) Are you 37 years old or older? NO   N (neck) Do you have a neck circumference greater than 16 inches?      G (gender) Are you a male? YES   TOTAL STOP/BANG "YES" ANSWERS 5                                                                       For Office Use Only              Procedure Order Form    YES to 3+ Stop Bang questions OR two clinical symptoms - patient qualifies for WatchPAT (CPT 95800)      Clinical Notes: Will consult Sleep Specialist and refer for management of therapy due to patient increased risk of Sleep Apnea. Ordering a sleep study due to the following two clinical symptoms: Excessive daytime sleepiness G47.10 / History of high blood pressure R03.0

## 2022-02-26 NOTE — Progress Notes (Addendum)
ADVANCED HF CLINIC CONSULT NOTE  Referring Physician:Mihai Croitoru, MD Primary Care: Pcp, No Primary Cardiologist: Sanda Klein, MD  HPI:  Shawn [name] is a 36 y.o. 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 Sallyanne Kuster for further evaluation of his HF.   Presented with heart failure and EF 35-40% in May 2016 in the setting of severe hypertension.  Had an embolic stroke July [id number] with LV thrombus seen on echo, EF 20% on echo in August 2016.  With medical therapy and anticoagulation, EF improved to 40-45% and LV thrombus resolved on echo January 2017.  Following a period of noncompliance with diet and medications, EF dropped down to 20-25% on echo in June 2019.  Developed DVT of the left lower extremity in July 2020, but while on treatment with Eliquis developed LV apical thrombus again, so was restarted on warfarin.     Says he feels pretty good as long as he is taking his meds. Takes his kid to school, goes to the store and gets around without problem. Goes to gym 2-3x/week. Walks on TM 7mh/10% without problems. Denies SOB, orthopnea, PND, CP or edema. Ran out of spiro about 3-4 days ago. Doesn't follow BP at home. Occasioanl daytime fatigue. Not sure if he snores. Has not had a sleep study.   No known Fhx of HF.   Echo 1/23 EF 20-25% RV mildly reduced   CPX 2/23  FVC 5.11 (109%)      FEV1 3.84 (100%)        FEV1/FVC 75 (91%)         MVV 172 (99%)  BP rest: 128/72 Standing BP: 118/74 BP peak: 148/76  Peak VO2: 21.7 (81% predicted peak VO2)  VE/VCO2 slope:  45  OUES: 3.29  Peak RER: 0.87  Ventilatory Threshold: 18.5 (69% predicted or measured peak VO2)  Peak RR 53  Peak Ventilation:  114.9  VE/MVV:  67%  PETCO2 at peak:  25  O2pulse:  24   (114%  predicted O2pulse)   Review of Systems: [y] = yes, [ ]$  = no   General: Weight gain [ ]$ ; Weight loss [ ]$ ; Anorexia [ ]$ ; Fatigue [ ]$ ; Fever [ ]$ ; Chills [ ]$ ; Weakness [ ]$   Cardiac: Chest pain/pressure [ ]$ ; Resting SOB [ ]$ ; Exertional SOB [ y]; Orthopnea [ ]$ ; Pedal Edema [ ]$ ; Palpitations [ ]$ ; Syncope [ ]$ ; Presyncope [ ]$ ; Paroxysmal nocturnal dyspnea[ ]$   Pulmonary: Cough [ ]$ ; Wheezing[ ]$ ; Hemoptysis[ ]$ ; Sputum [ ]$ ; Snoring [ ]$   GI: Vomiting[ ]$ ; Dysphagia[ ]$ ; Melena[ ]$ ; Hematochezia [ ]$ ; Heartburn[ ]$ ; Abdominal pain [ ]$ ; Constipation [ ]$ ; Diarrhea [ ]$ ; BRBPR [ ]$   GU: Hematuria[ ]$ ; Dysuria [ ]$ ; Nocturia[ ]$   Vascular: Pain in legs with walking [ ]$ ; Pain in feet with lying flat [ ]$ ; Non-healing sores [ ]$ ; Stroke [ ]$ ; TIA [ ]$ ; Slurred speech [ ]$ ;  Neuro: Headaches[ ]$ ; Vertigo[ ]$ ; Seizures[ ]$ ; Paresthesias[ ]$ ;Blurred vision [ ]$ ; Diplopia [ ]$ ; Vision changes [ ]$   Ortho/Skin: Arthritis [ ]$ ; Joint pain [ ]$ ; Muscle pain [ ]$ ; Joint swelling [ ]$ ; Back Pain [ ]$ ; Rash [ ]$   Psych: Depression[ ]$ ; Anxiety[ ]$   Heme: Bleeding problems [ ]$ ; Clotting disorders [ ]$ ; Anemia [ ]$   Endocrine: Diabetes [ ]$ ; Thyroid dysfunction[ ]$   Past Medical History:  Diagnosis Date   At risk for sleep apnea    STOP-BANG= 4      SENT TO PCP 11-10-2014   CKD (chronic kidney disease), stage III (HCC)    Dyspnea on exertion    Hyperlipidemia    Hypertension    at age 22   Malignant hypertension    dx age 50   Nonischemic cardiomyopathy ([place])    Penile adhesions    Systolic and diastolic CHF, chronic ([place]) dx 09-18-2014   cardiologist-  dr Dani Gobble croitoru----  ef 25-30% per last note 11-03-2014    Current Outpatient Medications  Medication Sig Dispense Refill   acetaminophen (TYLENOL) 500 MG tablet Take 1,000 mg by mouth every 6 (six) hours as needed for mild pain.     baclofen (LIORESAL) 10 MG tablet Take 0.5-1 tablets (5-10 mg total) by mouth 3 (three) times daily as needed for muscle spasms. 30 each 0   carvedilol (COREG) 25  MG tablet TAKE 1 TABLET(25 MG) BY MOUTH TWICE DAILY WITH A MEAL 180 tablet 3   dapagliflozin propanediol (FARXIGA) 10 MG TABS tablet Take 1 tablet (10 mg total) by mouth daily before breakfast. 30 tablet 11   ENTRESTO 97-103 MG TAKE 1 TABLET BY MOUTH TWICE DAILY 60 tablet 5   furosemide (LASIX) 40 MG tablet TAKE 1 TABLET(40 MG) BY MOUTH DAILY 90 tablet 3   spironolactone (ALDACTONE) 25 MG tablet Take 0.5 tablets (12.5 mg total) by mouth daily. 15 tablet 11   warfarin (COUMADIN) 5 MG tablet TAKE 2 TO 3 TABLETS BY MOUTH DAILY AS DIRECTED BY CLINIC 70 tablet 0   No current facility-administered medications for this encounter.    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  Tobacco Use   Smoking status: Former    Packs/day: 0.50    Years: 10.00    Total pack years: 5.00    Types: Cigarettes    Quit date: 04/19/2014    Years since quitting: 7.8    Passive exposure: Past   Smokeless tobacco: Never  Vaping Use   Vaping Use: Never used  Substance and Sexual Activity   Alcohol use: Yes    Alcohol/week: 0.0 standard drinks of alcohol    Comment: 3 beers per week    Drug use: Yes    Types: Marijuana   Sexual activity: Yes  Other Topics Concern   Not on file  Social History Narrative   Not on file   Social Determinants of Health   Financial Resource Strain: Not on file  Food Insecurity: Not on file  Transportation Needs: Not on file  Physical Activity: Not on file  Stress: Not on file  Social Connections: Not on file  Intimate Partner Violence: Not on file      Family History  Problem Relation Age of Onset   Diabetes Mother    Hypertension Mother     Kidney disease Brother 55       on dialysis. never went to the doctor     Vitals:   02/26/22 0930  BP: (!) 148/88  Pulse:  71  SpO2: 98%  Weight: (!) 139.1 kg (306 lb 9.6 oz)    PHYSICAL EXAM: General:  Well appearing. No respiratory difficulty HEENT: normal Neck: supple. no JVD. Carotids 2+ bilat; no bruits. No lymphadenopathy or thryomegaly appreciated. Cor: PMI nondisplaced. Regular rate & rhythm. No rubs, gallops or murmurs. Lungs: clear Abdomen: obese soft, nontender, nondistended. No hepatosplenomegaly. No bruits or masses. Good bowel sounds. Extremities: no cyanosis, clubbing, rash, edema Neuro: alert & oriented x 3, cranial nerves grossly intact. moves all 4 extremities w/o difficulty. Affect pleasant.  ASSESSMENT & PLAN:  1. Chronic systolic HF d/t NICM:  - Echo 2016 EF 35-40% - Echo 2017 EF 40-45% - Echo 2019 EF 20-25% on echo in June 2019.  Developed DVT of the left lower extremity in July 2020, but while on treatment with Eliquis developed LV apical thrombus again, so was restarted on warfarin.   - Echo 1/23 EF 20-25% RV mildly reduced  - Felt to be NICM (has not had cath due to CKD and low risk for CAD) - NYHA II. Volume status ok  - Continue Farxiga 10 - Continue carvedilol 25 bid - Continue spiro 25 - Continue Entresto 97/103 bid - CPX 8/23: moderate HF limitation: pVO2: 21.7 (81% predicted peak VO2); slope 45; pRER: 0.87  - EF not improving with optimal GDMT.  - Check cMRI - Will need to consider ICD at some point - Based on CPX does not qualify for advanced therapies at this time  2. HTN:  - BP improved but still high - will have him follow home BPs  3. Morbid obesity - Body mass index is 42.76 kg/m. - Refer to [facility] for possible GLP-1RA  4. CKD 3a - baseline SCr 1.7-1.9 - check labs today  5. H/o CVA d/t LV thrombus - continue warfarin  6. Snoring - Check sleep study  Glori Bickers, MD  4:38 PM

## 2022-02-26 NOTE — Patient Instructions (Signed)
Medication Changes:  RESTART Spironolactone 12.5 mg (1/2 tab) Daily  Lab Work:  Labs done today, your results will be available in MyChart, we will contact you for abnormal readings.  Testing/Procedures:  Your physician has requested that you have a cardiac MRI. Cardiac MRI uses a computer to create images of your heart as its beating, producing both still and moving pictures of your heart and major blood vessels. For further information please visit [email or web address] Please follow the instruction sheet given to you today for more information. YOU WILL BE CALLED TO SCHEDULE THIS  Your physician has recommended that you have a sleep study. This test records several body functions during sleep, including: brain activity, eye movement, oxygen and carbon dioxide blood levels, heart rate and rhythm, breathing rate and rhythm, the flow of air through your mouth and nose, snoring, body muscle movements, and chest and belly movement. YOU WILL BE CALLED TO SCHEDULE THIS  Referrals:  You have been referred to [facility]  Special Instructions // Education:  Do the following things EVERYDAY: Weigh yourself in the morning before breakfast. Write it down and keep it in a log. Take your medicines as prescribed Eat low salt foods--Limit salt (sodium) to 2000 mg per day.  Stay as active as you can everyday Limit all fluids for the day to less than 2 liters   Follow-Up in: Your physician recommends that you schedule a follow-up appointment in: 4 months (Feb 2024), **PLEASE CALL OUR OFFICE IN DECEMBER TO SCHEDULE THIS APPOINTMENT  At [facility], you and your health needs are our priority. We have a designated team specialized in the treatment of Heart Failure. This Care Team includes your primary Heart Failure Specialized Cardiologist (physician), Advanced Practice Providers (APPs- Physician Assistants and Nurse Practitioners), and Pharmacist who all work together  to provide you with the care you need, when you need it.   You may see any of the following providers on your designated Care Team at your next follow up:  Dr. Glori Bickers Dr. Loralie Champagne Dr. Roxana Hires, NP Lyda Jester, [facility] [place], [place] Forestine Na, NP Audry Riles, PharmD   Please be sure to bring in all your medications bottles to every appointment.   Need to Contact us:  If you have any questions or concerns before your next appointment please send [place] a message through [place] or call our office at [phone].    TO LEAVE A MESSAGE FOR THE NURSE SELECT OPTION 2, PLEASE LEAVE A MESSAGE INCLUDING: YOUR NAME DATE OF BIRTH CALL BACK NUMBER REASON FOR CALL**this is important as we prioritize the call backs  YOU WILL RECEIVE A CALL BACK THE SAME DAY AS LONG AS YOU CALL BEFORE 4:00 PM

## 2022-03-04 ENCOUNTER — Encounter (INDEPENDENT_AMBULATORY_CARE_PROVIDER_SITE_OTHER): Payer: Medicaid Other | Admitting: Internal Medicine

## 2022-03-20 ENCOUNTER — Encounter (INDEPENDENT_AMBULATORY_CARE_PROVIDER_SITE_OTHER): Payer: Self-pay | Admitting: Internal Medicine

## 2022-03-20 ENCOUNTER — Ambulatory Visit (INDEPENDENT_AMBULATORY_CARE_PROVIDER_SITE_OTHER): Payer: Medicaid Other | Admitting: Internal Medicine

## 2022-03-20 VITALS — BP 165/95 | HR 73 | Temp 98.2°F | Ht 71.25 in | Wt 298.6 lb

## 2022-03-20 DIAGNOSIS — Z6841 Body Mass Index (BMI) 40.0 and over, adult: Secondary | ICD-10-CM | POA: Diagnosis not present

## 2022-03-20 DIAGNOSIS — I110 Hypertensive heart disease with heart failure: Secondary | ICD-10-CM | POA: Diagnosis not present

## 2022-03-20 DIAGNOSIS — I10 Essential (primary) hypertension: Secondary | ICD-10-CM

## 2022-03-20 DIAGNOSIS — I5042 Chronic combined systolic (congestive) and diastolic (congestive) heart failure: Secondary | ICD-10-CM

## 2022-03-20 NOTE — Progress Notes (Signed)
Office: [phone]  /  Fax: [phone]   Initial Visit  Shawn [name] was seen in clinic today to evaluate for obesity. He is interested in losing weight to improve overall health and reduce the risk of weight related complications. He presents today to review program treatment options, initial physical assessment, and evaluation.  Patient is interested in losing weight.  Has actually started the process by decreasing eating out and cooking more.  He used to eat out at least 2 times a week.  He has hypertension and nonischemic cardiomyopathy with combined systolic and diastolic heart failure.  He is followed by cardiology and is on guideline based medical therapy.  He smokes and also drinks alcohol in moderation.  He was referred by: PCP  When asked what else they would like to accomplish? He states: Adopt healthier eating patterns, Improve existing medical conditions, and Reduce number of medications  When asked how has your weight affected you? He states: Contributed to medical problems, Having poor endurance, and Problems with eating patterns  Some associated conditions: Hypertension and Heart disease  Contributing factors: Family history, Nutritional, and Eating patterns  Weight promoting medications identified: None  Current nutrition plan: Other: Patient has reduced processed foods and eating out  Current level of physical activity: Walking  Current or previous pharmacotherapy: SGLT-2  Response to medication: Other: Medication being used for heart failure   Past medical history includes:   Past Medical History:  Diagnosis Date   At risk for sleep apnea    STOP-BANG= 4      SENT TO PCP 11-10-2014   CKD (chronic kidney disease), stage III (HCC)    Dyspnea on exertion    Hyperlipidemia    Hypertension    at age 68   Malignant hypertension    dx age 21   Nonischemic cardiomyopathy (HCC)    Penile adhesions    Systolic and diastolic CHF, chronic (HCC) dx  09-18-2014   cardiologist-  dr Rachelle Hora croitoru----  ef 25-30% per last note 11-03-2014     Objective:   BP (!) 171/98   Pulse 73   Temp 98.2 F (36.8 C)   Ht 5' 11.25" (1.81 m)   Wt 298 lb 9.6 oz (135.4 kg)   SpO2 100%   BMI 41.35 kg/m  He was weighed on the bioimpedance scale: Body mass index is 41.35 kg/m.  Peak Weight: 320,Visceral Fat Rating: 20%, Body Fat%: 36.7, Weight trend over the last 12 months: Decreasing  General:  Alert, oriented and cooperative. Patient is in no acute distress.  Respiratory: Normal respiratory effort, no problems with respiration noted  Extremities: Normal range of motion.    Mental Status: Normal mood and affect. Normal behavior. Normal judgment and thought content.   Assessment and Plan:  1. Class 3 severe obesity with body mass index (BMI) of 40.0 to 44.9 in adult, unspecified obesity type, unspecified whether serious comorbidity present (HCC) We reviewed weight, biometrics, associated medical conditions and contributing factors with patient. He would benefit from weight loss therapy via a modified calorie, low-carb, high-protein nutritional plan tailored to their REE (resting energy expenditure) which will be determined by indirect calorimetry.  We will also assess for cardiometabolic risk and nutritional derangements via fasting serologies at his next appointment.   Patient has already initiated process by reducing frequency of takeout and fast foods.  He is also started to cook and has been walking 3 times a week.  He has managed to lose weight.  He seems  very motivated to make changes.  We discussed enrollment in the program versus a one-time nutritional consult and will be thinking about his options.   2. Hypertension, essential Blood pressure is above target.  I counseled him on the risk associated with uncontrolled blood pressure.  It seems like medications may not be covering him 24 hours or he may have not dipping in the evening.  His blood  pressure is markedly elevated this morning he did smoke prior to coming in.  I counseled him on the risk of smoking as well as alcohol use.  He reports good compliance with medications.  I advised him to purchase a blood pressure machine for his upper arm and taught him how to correctly checked his blood pressure.  His goal blood pressure should be less than 120/80.  This was reviewed with him.  I advised him to check his blood pressure in the morning and also before bedtime to record it and to forward results over the next 7 days to his cardiologist or PCP for medication adjustment.  3. Chronic combined systolic and diastolic CHF (congestive heart failure) ([place]) The present time he is euvolemic without any signs or symptoms of decompensation.  He is on guidelines based medical therapy without any orthostasis.  His blood pressure is not at goal.  We discussed maintaining a low-sodium diet.  We will check renal parameters if he decides to enroll in our program at his next appointment.  He will continue on carvedilol, Farxiga, furosemide, Entresto and Aldactone.  He is also on Coumadin as he had a history of a thrombus related to decreased ejection fraction.      Obesity Treatment / Action Plan:  Patient will work on garnering support from family and friends to begin weight loss journey. Will work on eliminating or reducing the presence of highly palatable, calorie dense foods in the home. Will complete provided nutritional and psychosocial assessment questionnaire before the next appointment. Will be scheduled for indirect calorimetry to determine resting energy expenditure in a fasting state.  This will allow [place] to create a reduced calorie, high-protein meal plan to promote loss of fat mass while preserving muscle mass. Will think about ideas on how to incorporate physical activity into their daily routine. Was counseled on nutritional approaches to weight loss and benefits of complex carbs and high  quality protein as part of nutritional weight management.  Obesity Education Performed Today:  He was weighed on the bioimpedance scale and results were discussed and documented in the synopsis.  We discussed obesity as a disease and the importance of a more detailed evaluation of all the factors contributing to the disease.  We discussed the importance of long term lifestyle changes which include nutrition, exercise and behavioral modifications as well as the importance of customizing this to his specific health and social needs.  We discussed the benefits of reaching a healthier weight to alleviate the symptoms of existing conditions and reduce the risks of the biomechanical, metabolic and psychological effects of obesity.  Shawn [name] appears to be in the action stage of change and states they are ready to start intensive lifestyle modifications and behavioral modifications.  30 minutes was spent today on this visit including the above counseling, pre-visit chart review, and post-visit documentation.  Reviewed by clinician on day of visit: allergies, medications, problem list, medical history, surgical history, family history, social history, and previous encounter notes.    I have reviewed the above documentation for accuracy and completeness,  and I agree with the above.  Worthy Rancher, MD

## 2022-03-22 ENCOUNTER — Other Ambulatory Visit: Payer: Self-pay | Admitting: Cardiovascular Disease

## 2022-03-22 DIAGNOSIS — Z5181 Encounter for therapeutic drug level monitoring: Secondary | ICD-10-CM

## 2022-03-22 DIAGNOSIS — I240 Acute coronary thrombosis not resulting in myocardial infarction: Secondary | ICD-10-CM

## 2022-04-01 ENCOUNTER — Ambulatory Visit: Payer: Medicaid Other

## 2022-04-04 ENCOUNTER — Ambulatory Visit: Payer: Medicaid Other

## 2022-04-16 ENCOUNTER — Ambulatory Visit: Payer: Medicaid Other | Attending: Cardiology

## 2022-04-22 ENCOUNTER — Other Ambulatory Visit: Payer: Self-pay | Admitting: Cardiovascular Disease

## 2022-04-24 ENCOUNTER — Telehealth: Payer: Self-pay | Admitting: Cardiovascular Disease

## 2022-04-24 DIAGNOSIS — I240 Acute coronary thrombosis not resulting in myocardial infarction: Secondary | ICD-10-CM

## 2022-04-24 DIAGNOSIS — Z5181 Encounter for therapeutic drug level monitoring: Secondary | ICD-10-CM

## 2022-04-24 MED ORDER — WARFARIN SODIUM 5 MG PO TABS: ORAL_TABLET | ORAL | 3 refills | Status: DC

## 2022-04-24 NOTE — Telephone Encounter (Signed)
*  STAT* If patient is at the pharmacy, call can be transferred to refill team.   1. Which medications need to be refilled? (please list name of each medication and dose if known) warfarin (COUMADIN) 5 MG tablet   2. Which pharmacy/location (including street and city if local pharmacy) is medication to be sent to? [id number] - [place], [place] - [address] AT [facility] OF [address]   3. Do they need a 30 day or 90 day supply? 30  Took his last pill last night.

## 2022-04-24 NOTE — Telephone Encounter (Addendum)
Refill request for warfarin:  Last INR was 2.3 on 02/18/22 Next INR due 04/25/22 LOV was 07/04/21  Odessa Fleming MD  Refill approved.

## 2022-04-25 ENCOUNTER — Ambulatory Visit: Payer: Medicaid Other

## 2022-04-29 ENCOUNTER — Ambulatory Visit: Payer: Medicaid Other

## 2022-05-01 ENCOUNTER — Ambulatory Visit: Payer: Medicaid Other

## 2022-05-07 ENCOUNTER — Ambulatory Visit: Payer: Medicaid Other | Attending: Cardiovascular Disease

## 2022-05-07 DIAGNOSIS — I240 Acute coronary thrombosis not resulting in myocardial infarction: Secondary | ICD-10-CM

## 2022-05-07 DIAGNOSIS — Z5181 Encounter for therapeutic drug level monitoring: Secondary | ICD-10-CM

## 2022-05-07 LAB — POCT INR: INR: 2.3 (ref 2.0–3.0)

## 2022-05-07 NOTE — Patient Instructions (Signed)
Description   Continue taking 2 tablets daily except 3 tablets each Monday, Wednesday, and Friday. Recheck in 6 weeks.  Call with any questions # [phone].   BILL [id number]     

## 2022-05-07 NOTE — Telephone Encounter (Signed)
This encounter was created in error - please disregard.

## 2022-06-02 ENCOUNTER — Telehealth (HOSPITAL_COMMUNITY): Payer: Self-pay | Admitting: Emergency Medicine

## 2022-06-02 NOTE — Telephone Encounter (Signed)
Reaching out to patient to offer assistance regarding upcoming cardiac imaging study; pt verbalizes understanding of appt date/time, parking situation and where to check in, pre-test NPO status and medications ordered, and verified current allergies; name and call back number provided for further questions should they arise Shawn Bond RN Navigator Cardiac Imaging [facility] [phone] office [phone] cell  Arrival 52 [facility] Denies metal implants Denies claustro Denies iv issues Holding diuretics

## 2022-06-04 ENCOUNTER — Encounter (HOSPITAL_COMMUNITY): Payer: Self-pay

## 2022-06-04 ENCOUNTER — Ambulatory Visit (HOSPITAL_COMMUNITY)
Admission: RE | Admit: 2022-06-04 | Discharge: 2022-06-04 | Disposition: A | Discharge status: Home / Self Care | Payer: Medicaid Other | Source: Ambulatory Visit | Attending: Internal Medicine | Admitting: Internal Medicine

## 2022-06-04 ENCOUNTER — Telehealth (HOSPITAL_COMMUNITY): Payer: Self-pay | Admitting: Emergency Medicine

## 2022-06-04 DIAGNOSIS — I5042 Chronic combined systolic (congestive) and diastolic (congestive) heart failure: Secondary | ICD-10-CM

## 2022-06-04 DIAGNOSIS — I428 Other cardiomyopathies: Secondary | ICD-10-CM

## 2022-06-04 MED ORDER — DIAZEPAM 5 MG PO TABS: 5.0000 mg | ORAL_TABLET | Freq: Once | ORAL | 0 refills | Status: AC

## 2022-06-04 NOTE — Telephone Encounter (Signed)
R/s CMR for 3/1 9am  One time dose 5mg  valium 1 hr prior to CMR appt Marchia Bond RN Navigator Cardiac Imaging [facility] [phone] Office  [phone] Cell   Rx [facility] to [facility] [address] [facility] [address]

## 2022-06-18 ENCOUNTER — Ambulatory Visit: Payer: Medicaid Other

## 2022-06-24 ENCOUNTER — Ambulatory Visit: Payer: Medicaid Other | Attending: Internal Medicine

## 2022-06-24 DIAGNOSIS — Z5181 Encounter for therapeutic drug level monitoring: Secondary | ICD-10-CM

## 2022-06-24 DIAGNOSIS — I240 Acute coronary thrombosis not resulting in myocardial infarction: Secondary | ICD-10-CM

## 2022-06-24 LAB — POCT INR: INR: 3.2 — AB (ref 2.0–3.0)

## 2022-06-24 MED ORDER — WARFARIN SODIUM 5 MG PO TABS: ORAL_TABLET | ORAL | 3 refills | Status: DC

## 2022-06-24 NOTE — Patient Instructions (Signed)
Continue taking 2 tablets daily except 3 tablets each Monday, Wednesday, and Friday.  EAT GREENS TONIGHT. Recheck in 6 weeks.  Call with any questions # [phone].   BILL [id number]

## 2022-06-25 ENCOUNTER — Other Ambulatory Visit: Payer: Self-pay

## 2022-06-25 DIAGNOSIS — I5043 Acute on chronic combined systolic (congestive) and diastolic (congestive) heart failure: Secondary | ICD-10-CM

## 2022-06-25 MED ORDER — FUROSEMIDE 40 MG PO TABS: ORAL_TABLET | ORAL | 3 refills | Status: DC

## 2022-06-25 MED ORDER — FUROSEMIDE 40 MG PO TABS: ORAL_TABLET | ORAL | 0 refills | Status: DC

## 2022-06-27 NOTE — Progress Notes (Incomplete)
ADVANCED HF CLINIC CONSULT NOTE  Referring Physician:Mihai Croitoru, MD Primary Care: Pcp, No Primary Cardiologist: Sanda Klein, MD  HPI:  Shawn [name] is a 37 y.o. 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 Sallyanne Kuster for further evaluation of his HF.   Presented with heart failure and EF 35-40% in May 2016 in the setting of severe hypertension.  Had an embolic stroke July [id number] with LV thrombus seen on echo, EF 20% on echo in August 2016.  With medical therapy and anticoagulation, EF improved to 40-45% and LV thrombus resolved on echo January 2017.  Following a period of noncompliance with diet and medications, EF dropped down to 20-25% on echo in June 2019.  Developed DVT of the left lower extremity in July 2020, but while on treatment with Eliquis developed LV apical thrombus again, so was restarted on warfarin.     Says he feels pretty good as long as he is taking his meds. Takes his kid to school, goes to the store and gets around without problem. Goes to gym 2-3x/week. Walks on TM 7mh/10% without problems. Denies SOB, orthopnea, PND, CP or edema. Ran out of spiro about 3-4 days ago. Doesn't follow BP at home. Occasioanl daytime fatigue. Not sure if he snores. Has not had a sleep study.   No known Fhx of HF.   Echo 1/23 EF 20-25% RV mildly reduced   CPX 2/23  FVC 5.11 (109%)      FEV1 3.84 (100%)        FEV1/FVC 75 (91%)         MVV 172 (99%)  BP rest: 128/72 Standing BP: 118/74 BP peak: 148/76  Peak VO2: 21.7 (81% predicted peak VO2)  VE/VCO2 slope:  45  OUES: 3.29  Peak RER: 0.87  Ventilatory Threshold: 18.5 (69% predicted or measured peak VO2)  Peak RR 53  Peak Ventilation:  114.9  VE/MVV:  67%  PETCO2 at peak:  25  O2pulse:  24   (114%  predicted O2pulse)   Review of Systems: [y] = yes, [ ]$  = no   General: Weight gain [ ]$ ; Weight loss [ ]$ ; Anorexia [ ]$ ; Fatigue [ ]$ ; Fever [ ]$ ; Chills [ ]$ ; Weakness [ ]$   Cardiac: Chest pain/pressure [ ]$ ; Resting SOB [ ]$ ; Exertional SOB [ y]; Orthopnea [ ]$ ; Pedal Edema [ ]$ ; Palpitations [ ]$ ; Syncope [ ]$ ; Presyncope [ ]$ ; Paroxysmal nocturnal dyspnea[ ]$   Pulmonary: Cough [ ]$ ; Wheezing[ ]$ ; Hemoptysis[ ]$ ; Sputum [ ]$ ; Snoring [ ]$   GI: Vomiting[ ]$ ; Dysphagia[ ]$ ; Melena[ ]$ ; Hematochezia [ ]$ ; Heartburn[ ]$ ; Abdominal pain [ ]$ ; Constipation [ ]$ ; Diarrhea [ ]$ ; BRBPR [ ]$   GU: Hematuria[ ]$ ; Dysuria [ ]$ ; Nocturia[ ]$   Vascular: Pain in legs with walking [ ]$ ; Pain in feet with lying flat [ ]$ ; Non-healing sores [ ]$ ; Stroke [ ]$ ; TIA [ ]$ ; Slurred speech [ ]$ ;  Neuro: Headaches[ ]$ ; Vertigo[ ]$ ; Seizures[ ]$ ; Paresthesias[ ]$ ;Blurred vision [ ]$ ; Diplopia [ ]$ ; Vision changes [ ]$   Ortho/Skin: Arthritis [ ]$ ; Joint pain [ ]$ ; Muscle pain [ ]$ ; Joint swelling [ ]$ ; Back Pain [ ]$ ; Rash [ ]$   Psych: Depression[ ]$ ; Anxiety[ ]$   Heme: Bleeding problems [ ]$ ; Clotting disorders [ ]$ ; Anemia [ ]$   Endocrine: Diabetes [ ]$ ; Thyroid dysfunction[ ]$   Past Medical History:  Diagnosis Date   At risk for sleep apnea    STOP-BANG= 4      SENT TO PCP 11-10-2014   CKD (chronic kidney disease), stage III (HCC)    Dyspnea on exertion    Hyperlipidemia    Hypertension    at age 49   Malignant hypertension    dx age 33   Nonischemic cardiomyopathy ([place])    Penile adhesions    Systolic and diastolic CHF, chronic ([place]) dx 09-18-2014   cardiologist-  dr Dani Gobble croitoru----  ef 25-30% per last note 11-03-2014    Current Outpatient Medications  Medication Sig Dispense Refill   acetaminophen (TYLENOL) 500 MG tablet Take 1,000 mg by mouth every 6 (six) hours as needed for mild pain.     baclofen (LIORESAL) 10 MG tablet Take 0.5-1 tablets (5-10 mg total) by mouth 3 (three) times daily as needed for muscle spasms. 30 each 0   carvedilol (COREG) 25  MG tablet TAKE 1 TABLET(25 MG) BY MOUTH TWICE DAILY WITH A MEAL 180 tablet 3   dapagliflozin propanediol (FARXIGA) 10 MG TABS tablet Take 1 tablet (10 mg total) by mouth daily before breakfast. 30 tablet 11   ENTRESTO 97-103 MG TAKE 1 TABLET BY MOUTH TWICE DAILY 60 tablet 5   furosemide (LASIX) 40 MG tablet TAKE 1 TABLET(40 MG) BY MOUTH DAILY 30 tablet 0   spironolactone (ALDACTONE) 25 MG tablet Take 0.5 tablets (12.5 mg total) by mouth daily. 15 tablet 11   warfarin (COUMADIN) 5 MG tablet TAKE 2 TO 3 TABLETS BY MOUTH DAILY AS DIRECTED BY CLINIC 70 tablet 3   No current facility-administered medications for this visit.    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  Tobacco Use   Smoking status: Former    Packs/day: 0.50    Years: 10.00    Total pack years: 5.00    Types: Cigarettes    Quit date: 04/19/2014    Years since quitting: 8.1    Passive exposure: Past   Smokeless tobacco: Never  Vaping Use   Vaping Use: Never used  Substance and Sexual Activity   Alcohol use: Yes    Alcohol/week: 0.0 standard drinks of alcohol    Comment: 3 beers per week    Drug use: Yes    Types: Marijuana   Sexual activity: Yes  Other Topics Concern   Not on file  Social History Narrative   Not on file   Social Determinants of Health   Financial Resource Strain: Not on file  Food Insecurity: Not on file  Transportation Needs: Not on file  Physical Activity: Not on file  Stress: Not on file  Social Connections: Not on file  Intimate Partner Violence: Not on file      Family History  Problem Relation Age of Onset   Diabetes Mother    Hypertension Mother    Kidney  disease Brother 19       on dialysis. never went to the doctor     There were no vitals filed for this visit.   PHYSICAL  EXAM: General:  Well appearing. No respiratory difficulty HEENT: normal Neck: supple. no JVD. Carotids 2+ bilat; no bruits. No lymphadenopathy or thryomegaly appreciated. Cor: PMI nondisplaced. Regular rate & rhythm. No rubs, gallops or murmurs. Lungs: clear Abdomen: obese soft, nontender, nondistended. No hepatosplenomegaly. No bruits or masses. Good bowel sounds. Extremities: no cyanosis, clubbing, rash, edema Neuro: alert & oriented x 3, cranial nerves grossly intact. moves all 4 extremities w/o difficulty. Affect pleasant.  ASSESSMENT & PLAN:  1. Chronic systolic HF d/t NICM:  - Echo 2016 EF 35-40% - Echo 2017 EF 40-45% - Echo 2019 EF 20-25% on echo in June 2019.  Developed DVT of the left lower extremity in July 2020, but while on treatment with Eliquis developed LV apical thrombus again, so was restarted on warfarin.   - Echo 1/23 EF 20-25% RV mildly reduced  - Felt to be NICM (has not had cath due to CKD and low risk for CAD) - NYHA II. Volume status ok  - Continue Farxiga 10 - Continue carvedilol 25 bid - Continue spiro 25 - Continue Entresto 97/103 bid - CPX 8/23: moderate HF limitation: pVO2: 21.7 (81% predicted peak VO2); slope 45; pRER: 0.87  - EF not improving with optimal GDMT.  - Check cMRI - Will need to consider ICD at some point - Based on CPX does not qualify for advanced therapies at this time  2. HTN:  - BP improved but still high - will have him follow home BPs  3. Morbid obesity - There is no height or weight on file to calculate BMI. - Refer to [facility] for possible GLP-1RA  4. CKD 3a - baseline SCr 1.7-1.9 - check labs today  5. H/o CVA d/t LV thrombus - continue warfarin  6. Snoring - Check sleep study  Rafael Bihari, FNP  9:36 AM

## 2022-06-30 ENCOUNTER — Encounter (HOSPITAL_COMMUNITY): Payer: Medicaid Other

## 2022-07-17 ENCOUNTER — Telehealth (HOSPITAL_COMMUNITY): Payer: Self-pay | Admitting: Emergency Medicine

## 2022-07-17 NOTE — Telephone Encounter (Signed)
Reaching out to patient to offer assistance regarding upcoming cardiac imaging study; pt verbalizes understanding of appt date/time, parking situation and where to check in, pre-test NPO status and medications ordered, and verified current allergies; name and call back number provided for further questions should they arise Marchia Bond RN Navigator Cardiac Imaging [facility] [phone] office [phone] cell  Arrival 0830 WC entrance  Denies iv issues Denies metal implants Claustro- '5mg'$  valium 1 hr prior to scan

## 2022-07-18 ENCOUNTER — Ambulatory Visit (HOSPITAL_COMMUNITY)
Admission: RE | Admit: 2022-07-18 | Discharge: 2022-07-18 | Disposition: A | Discharge status: Home / Self Care | Payer: Medicaid Other | Source: Ambulatory Visit | Attending: Internal Medicine | Admitting: Internal Medicine

## 2022-07-18 ENCOUNTER — Other Ambulatory Visit (HOSPITAL_COMMUNITY): Payer: Self-pay | Admitting: Internal Medicine

## 2022-07-18 DIAGNOSIS — I428 Other cardiomyopathies: Secondary | ICD-10-CM

## 2022-07-18 DIAGNOSIS — I5042 Chronic combined systolic (congestive) and diastolic (congestive) heart failure: Secondary | ICD-10-CM | POA: Diagnosis not present

## 2022-07-18 MED ORDER — LORAZEPAM 2 MG/ML IJ SOLN
INTRAMUSCULAR | Status: AC
  Filled 2022-07-18: qty 1

## 2022-07-18 MED ORDER — GADOBUTROL 1 MMOL/ML IV SOLN
10.0000 mL | Freq: Once | INTRAVENOUS | Status: AC | PRN
  Administered 2022-07-18: 10 mL via INTRAVENOUS

## 2022-08-05 ENCOUNTER — Ambulatory Visit: Payer: Medicaid Other | Attending: Cardiovascular Disease | Admitting: *Deleted

## 2022-08-05 DIAGNOSIS — Z5181 Encounter for therapeutic drug level monitoring: Secondary | ICD-10-CM | POA: Diagnosis not present

## 2022-08-05 DIAGNOSIS — I240 Acute coronary thrombosis not resulting in myocardial infarction: Secondary | ICD-10-CM | POA: Diagnosis not present

## 2022-08-05 LAB — POCT INR: INR: 2.4 (ref 2.0–3.0)

## 2022-08-05 NOTE — Patient Instructions (Signed)
Description   Continue taking 2 tablets daily except 3 tablets each Monday, Wednesday, and Friday. Recheck in 6 weeks.  Call with any questions # [phone].   BILL 91478

## 2022-09-17 ENCOUNTER — Ambulatory Visit: Payer: Medicaid Other | Attending: Cardiovascular Disease

## 2022-09-17 DIAGNOSIS — Z5181 Encounter for therapeutic drug level monitoring: Secondary | ICD-10-CM

## 2022-09-17 DIAGNOSIS — I240 Acute coronary thrombosis not resulting in myocardial infarction: Secondary | ICD-10-CM

## 2022-09-17 LAB — POCT INR: INR: 2.4 (ref 2.0–3.0)

## 2022-09-17 MED ORDER — WARFARIN SODIUM 5 MG PO TABS: ORAL_TABLET | ORAL | 2 refills | Status: DC

## 2022-09-17 NOTE — Patient Instructions (Signed)
Description   Continue taking 2 tablets daily except 3 tablets each Monday, Wednesday, and Friday.   Recheck in 6 weeks.  Call with any questions # [phone].   BILL 99211      

## 2022-10-14 ENCOUNTER — Other Ambulatory Visit: Payer: Self-pay | Admitting: Cardiovascular Disease

## 2022-10-29 ENCOUNTER — Ambulatory Visit: Payer: Medicaid Other | Attending: Cardiovascular Disease

## 2022-10-29 ENCOUNTER — Telehealth: Payer: Self-pay

## 2022-10-29 DIAGNOSIS — Z5181 Encounter for therapeutic drug level monitoring: Secondary | ICD-10-CM | POA: Diagnosis not present

## 2022-10-29 DIAGNOSIS — I240 Acute coronary thrombosis not resulting in myocardial infarction: Secondary | ICD-10-CM | POA: Diagnosis not present

## 2022-10-29 LAB — POCT INR: INR: 2 (ref 2.0–3.0)

## 2022-10-29 NOTE — Telephone Encounter (Signed)
Pt came to Anticoagulation Clinic today to have INR checked. Pt stated he was very concerned because he had a MRI on 07/18/22 and never received a phone call with results. He is requesting someone call to discuss these results as soon as possible.

## 2022-10-29 NOTE — Patient Instructions (Signed)
Description   Take 3.5 tablets today and then continue taking 2 tablets daily except 3 tablets each Monday, Wednesday, and Friday.  Recheck in 6 weeks.  Call with any questions # [phone].   BILL [address]9811

## 2022-10-31 NOTE — Telephone Encounter (Signed)
Pt aware results are in process Will return call once images reviewed

## 2022-10-31 NOTE — Telephone Encounter (Signed)
Will forward to provider  -image report not available only images   -pt is overdue for follow up

## 2022-11-02 NOTE — Telephone Encounter (Signed)
Read is in

## 2022-11-19 ENCOUNTER — Encounter (HOSPITAL_COMMUNITY): Payer: Self-pay

## 2022-11-19 ENCOUNTER — Telehealth (HOSPITAL_COMMUNITY): Payer: Self-pay

## 2022-11-19 DIAGNOSIS — D869 Sarcoidosis, unspecified: Secondary | ICD-10-CM

## 2022-11-19 NOTE — Telephone Encounter (Signed)
-----   Message from Dolores Patty, MD sent at 11/16/2022  1:24 PM EDT ----- EF improved to 40%. Please get cardiac PET to look for sarcoid based on MRI findings.

## 2022-12-10 ENCOUNTER — Telehealth: Payer: Self-pay

## 2022-12-10 ENCOUNTER — Ambulatory Visit: Payer: Medicaid Other

## 2022-12-10 NOTE — Telephone Encounter (Signed)
Pt missed coumadin clinic appt. Called pt to reschedule, no answer. Left message on voicemail with call back number.

## 2022-12-15 ENCOUNTER — Ambulatory Visit: Payer: Medicaid Other | Attending: Cardiovascular Disease

## 2022-12-15 DIAGNOSIS — I240 Acute coronary thrombosis not resulting in myocardial infarction: Secondary | ICD-10-CM

## 2022-12-15 DIAGNOSIS — Z5181 Encounter for therapeutic drug level monitoring: Secondary | ICD-10-CM

## 2022-12-15 LAB — POCT INR: INR: 2 (ref 2.0–3.0)

## 2022-12-15 NOTE — Patient Instructions (Signed)
Take 4 tablets today and then continue taking 2 tablets daily except 3 tablets each Monday, Wednesday, and Friday.  Recheck in 4 weeks.  Call with any questions # [phone].   BILL [id number]

## 2023-01-12 ENCOUNTER — Telehealth: Payer: Self-pay

## 2023-01-12 ENCOUNTER — Ambulatory Visit: Payer: Medicaid Other

## 2023-01-12 NOTE — Telephone Encounter (Signed)
Pt missed Coumadin Clinic appt. Called pt, no answer. Left message on voicemail.

## 2023-01-26 ENCOUNTER — Ambulatory Visit: Payer: Medicaid Other

## 2023-01-28 ENCOUNTER — Ambulatory Visit: Payer: Medicaid Other

## 2023-01-30 ENCOUNTER — Telehealth: Payer: Self-pay

## 2023-01-30 ENCOUNTER — Ambulatory Visit: Payer: Medicaid Other | Attending: Cardiology

## 2023-01-30 ENCOUNTER — Other Ambulatory Visit: Payer: Self-pay | Admitting: Cardiovascular Disease

## 2023-01-30 DIAGNOSIS — I240 Acute coronary thrombosis not resulting in myocardial infarction: Secondary | ICD-10-CM

## 2023-01-30 DIAGNOSIS — Z5181 Encounter for therapeutic drug level monitoring: Secondary | ICD-10-CM

## 2023-01-30 NOTE — Telephone Encounter (Signed)
Lpmtcb and reschedule INR appt. ?

## 2023-01-30 NOTE — Telephone Encounter (Addendum)
Pt is overdue for an appt. Last seen 12/15/22 and was due 01/12/23; he has an appt pending today. Will wait until pt comes to appt to refill  LV Thrombus Warfarin 5mg   Pt did not show for appt today and called pt x 2 and had to leave messages. Will deny refill and await pt to reschedule.

## 2023-02-11 ENCOUNTER — Other Ambulatory Visit: Payer: Self-pay | Admitting: Cardiovascular Disease

## 2023-02-11 DIAGNOSIS — I5043 Acute on chronic combined systolic (congestive) and diastolic (congestive) heart failure: Secondary | ICD-10-CM

## 2023-03-13 ENCOUNTER — Other Ambulatory Visit: Payer: Self-pay

## 2023-03-13 DIAGNOSIS — Z5181 Encounter for therapeutic drug level monitoring: Secondary | ICD-10-CM

## 2023-03-13 DIAGNOSIS — I240 Acute coronary thrombosis not resulting in myocardial infarction: Secondary | ICD-10-CM

## 2023-03-13 MED ORDER — WARFARIN SODIUM 5 MG PO TABS
ORAL_TABLET | ORAL | 0 refills | Status: DC
Start: 2023-03-13 — End: 2023-03-16

## 2023-03-16 ENCOUNTER — Ambulatory Visit: Payer: Medicaid Other | Attending: Cardiovascular Disease

## 2023-03-16 DIAGNOSIS — Z5181 Encounter for therapeutic drug level monitoring: Secondary | ICD-10-CM | POA: Diagnosis not present

## 2023-03-16 DIAGNOSIS — I240 Acute coronary thrombosis not resulting in myocardial infarction: Secondary | ICD-10-CM

## 2023-03-16 LAB — POCT INR: INR: 2 (ref 2.0–3.0)

## 2023-03-16 MED ORDER — WARFARIN SODIUM 5 MG PO TABS
ORAL_TABLET | ORAL | 0 refills | Status: DC
Start: 2023-03-16 — End: 2023-04-07

## 2023-03-16 NOTE — Patient Instructions (Signed)
continue taking 2 tablets daily except 3 tablets each Monday, Wednesday, and Friday.  Recheck in 4 weeks.  Call with any questions # [phone].   BILL [address]9811

## 2023-04-07 ENCOUNTER — Other Ambulatory Visit: Payer: Self-pay | Admitting: Cardiovascular Disease

## 2023-04-07 DIAGNOSIS — Z5181 Encounter for therapeutic drug level monitoring: Secondary | ICD-10-CM

## 2023-04-07 DIAGNOSIS — I240 Acute coronary thrombosis not resulting in myocardial infarction: Secondary | ICD-10-CM

## 2023-04-13 ENCOUNTER — Telehealth: Payer: Self-pay | Admitting: *Deleted

## 2023-04-13 ENCOUNTER — Ambulatory Visit: Payer: Medicaid Other

## 2023-04-13 NOTE — Telephone Encounter (Signed)
Spoke with pt and advised he missed his appt in the [facility]; was able to get him rescheduled to next available.

## 2023-04-24 ENCOUNTER — Telehealth: Payer: Self-pay

## 2023-04-24 ENCOUNTER — Ambulatory Visit: Payer: Medicaid Other

## 2023-04-24 ENCOUNTER — Other Ambulatory Visit: Payer: Self-pay

## 2023-04-24 DIAGNOSIS — Z5181 Encounter for therapeutic drug level monitoring: Secondary | ICD-10-CM

## 2023-04-24 DIAGNOSIS — I240 Acute coronary thrombosis not resulting in myocardial infarction: Secondary | ICD-10-CM

## 2023-04-24 MED ORDER — WARFARIN SODIUM 5 MG PO TABS
ORAL_TABLET | ORAL | 0 refills | Status: DC
Start: 2023-04-24 — End: 2023-05-01

## 2023-04-24 NOTE — Telephone Encounter (Signed)
Lpmtcb to reschedule INR appt

## 2023-05-01 ENCOUNTER — Ambulatory Visit: Payer: Medicaid Other | Attending: Cardiology

## 2023-05-01 DIAGNOSIS — Z5181 Encounter for therapeutic drug level monitoring: Secondary | ICD-10-CM | POA: Diagnosis not present

## 2023-05-01 DIAGNOSIS — I240 Acute coronary thrombosis not resulting in myocardial infarction: Secondary | ICD-10-CM

## 2023-05-01 LAB — POCT INR: INR: 1.9 — AB (ref 2.0–3.0)

## 2023-05-01 MED ORDER — WARFARIN SODIUM 5 MG PO TABS
ORAL_TABLET | ORAL | 0 refills | Status: DC
Start: 2023-05-01 — End: 2023-05-19

## 2023-05-01 NOTE — Patient Instructions (Signed)
Take 4 tablets today only then continue taking 2 tablets daily except 3 tablets each Monday, Wednesday, and Friday.  Recheck in 4 weeks.  Call with any questions # [phone].   BILL 82956

## 2023-05-07 ENCOUNTER — Other Ambulatory Visit: Payer: Self-pay

## 2023-05-07 DIAGNOSIS — I5043 Acute on chronic combined systolic (congestive) and diastolic (congestive) heart failure: Secondary | ICD-10-CM

## 2023-05-07 MED ORDER — CARVEDILOL 25 MG PO TABS: ORAL_TABLET | ORAL | 0 refills | Status: DC

## 2023-05-07 NOTE — Telephone Encounter (Signed)
This is a CHF pt 

## 2023-05-08 ENCOUNTER — Encounter (HOSPITAL_COMMUNITY): Payer: Self-pay | Admitting: *Deleted

## 2023-05-15 ENCOUNTER — Encounter (HOSPITAL_COMMUNITY): Payer: Self-pay

## 2023-05-19 ENCOUNTER — Other Ambulatory Visit: Payer: Self-pay | Admitting: Cardiovascular Disease

## 2023-05-19 DIAGNOSIS — I240 Acute coronary thrombosis not resulting in myocardial infarction: Secondary | ICD-10-CM

## 2023-05-19 DIAGNOSIS — Z5181 Encounter for therapeutic drug level monitoring: Secondary | ICD-10-CM

## 2023-05-19 NOTE — Telephone Encounter (Signed)
 Warfarin 5mg  refill Left ventricular thrombus without MI  Last INR 05/01/23 Last OV 02/26/22 with Dr. Gala Romney & Dr. Royann Shivers on 04/18/21

## 2023-05-27 ENCOUNTER — Ambulatory Visit: Payer: Medicaid Other | Attending: Cardiovascular Disease | Admitting: *Deleted

## 2023-05-27 DIAGNOSIS — I240 Acute coronary thrombosis not resulting in myocardial infarction: Secondary | ICD-10-CM

## 2023-05-27 DIAGNOSIS — Z5181 Encounter for therapeutic drug level monitoring: Secondary | ICD-10-CM

## 2023-05-27 LAB — POCT INR: INR: 2.5 (ref 2.0–3.0)

## 2023-05-27 NOTE — Patient Instructions (Signed)
 Description   Continue taking 2 tablets daily except 3 tablets each Monday, Wednesday, and Friday.  Recheck in 5 weeks.  Call with any questions # [phone].   BILL 82956

## 2023-06-01 ENCOUNTER — Telehealth (HOSPITAL_COMMUNITY): Payer: Self-pay | Admitting: *Deleted

## 2023-06-01 NOTE — Telephone Encounter (Signed)
 Reaching out to patient to offer assistance regarding upcoming cardiac imaging study; pt verbalizes understanding of appt date/time, parking situation and where to check in, pre-test NPO status; name and call back number provided for further questions should they arise  Larey Brick RN Navigator Cardiac Imaging [facility] [phone] office [phone] cell  Patient verbalized understanding of diet prep.

## 2023-06-03 ENCOUNTER — Encounter (HOSPITAL_COMMUNITY): Payer: Self-pay

## 2023-06-03 ENCOUNTER — Encounter (HOSPITAL_COMMUNITY)
Admission: RE | Admit: 2023-06-03 | Discharge: 2023-06-03 | Disposition: A | Discharge status: Home / Self Care | Payer: Medicaid Other | Source: Ambulatory Visit | Attending: Internal Medicine | Admitting: Internal Medicine

## 2023-06-03 DIAGNOSIS — D869 Sarcoidosis, unspecified: Secondary | ICD-10-CM | POA: Insufficient documentation

## 2023-06-18 ENCOUNTER — Other Ambulatory Visit: Payer: Self-pay | Admitting: Cardiovascular Disease

## 2023-06-19 ENCOUNTER — Other Ambulatory Visit: Payer: Self-pay | Admitting: Internal Medicine

## 2023-06-19 DIAGNOSIS — I5043 Acute on chronic combined systolic (congestive) and diastolic (congestive) heart failure: Secondary | ICD-10-CM

## 2023-06-21 ENCOUNTER — Other Ambulatory Visit: Payer: Self-pay | Admitting: Cardiovascular Disease

## 2023-06-21 DIAGNOSIS — Z5181 Encounter for therapeutic drug level monitoring: Secondary | ICD-10-CM

## 2023-06-21 DIAGNOSIS — I240 Acute coronary thrombosis not resulting in myocardial infarction: Secondary | ICD-10-CM

## 2023-07-01 ENCOUNTER — Ambulatory Visit: Payer: Medicaid Other | Attending: Cardiovascular Disease

## 2023-07-01 DIAGNOSIS — I240 Acute coronary thrombosis not resulting in myocardial infarction: Secondary | ICD-10-CM

## 2023-07-01 DIAGNOSIS — Z5181 Encounter for therapeutic drug level monitoring: Secondary | ICD-10-CM

## 2023-07-01 LAB — POCT INR: INR: 1.7 — AB (ref 2.0–3.0)

## 2023-07-01 MED ORDER — WARFARIN SODIUM 5 MG PO TABS: ORAL_TABLET | ORAL | 0 refills | Status: DC

## 2023-07-01 NOTE — Patient Instructions (Signed)
Description   Take 3.5 tablets today and then continue taking 2 tablets daily except 3 tablets each Monday, Wednesday, and Friday.  Recheck in 4 weeks.  Call with any questions # [phone].  BILL 74259

## 2023-07-12 ENCOUNTER — Other Ambulatory Visit: Payer: Self-pay | Admitting: Cardiovascular Disease

## 2023-07-12 DIAGNOSIS — I5043 Acute on chronic combined systolic (congestive) and diastolic (congestive) heart failure: Secondary | ICD-10-CM

## 2023-07-20 ENCOUNTER — Telehealth (HOSPITAL_COMMUNITY): Payer: Self-pay | Admitting: *Deleted

## 2023-07-20 NOTE — Telephone Encounter (Signed)
 Reaching out to patient to offer assistance regarding upcoming cardiac imaging study; pt verbalizes understanding of appt date/time, parking situation and where to check in, pre-test NPO status; name and call back number provided for further questions should they arise  Larey Brick RN Navigator Cardiac Imaging [facility] [phone] office [phone] cell  Patient states he understands his diet instructions but will need some medication for claustrophobia. Informed him I will send a message to his provider regarding this.

## 2023-07-20 NOTE — Telephone Encounter (Signed)
 Calling patient to make him aware that Dr. Gala Romney will prescribe a one time dose of 5mg  valium for his cardiac PET scan. Informed him he will need a driver to the appt due to the valium. He verbalized understanding.  Shawn Brick RN Navigator Cardiac Imaging [facility] Services [phone] Office [phone] Cell

## 2023-07-22 ENCOUNTER — Encounter (HOSPITAL_COMMUNITY)
Admission: RE | Admit: 2023-07-22 | Discharge: 2023-07-22 | Disposition: A | Discharge status: Home / Self Care | Payer: Medicaid Other | Source: Ambulatory Visit | Attending: Internal Medicine | Admitting: Internal Medicine

## 2023-07-29 ENCOUNTER — Ambulatory Visit: Payer: Medicaid Other

## 2023-08-03 ENCOUNTER — Other Ambulatory Visit: Payer: Self-pay | Admitting: Cardiovascular Disease

## 2023-08-03 DIAGNOSIS — I5043 Acute on chronic combined systolic (congestive) and diastolic (congestive) heart failure: Secondary | ICD-10-CM

## 2023-08-03 NOTE — Telephone Encounter (Signed)
 This is a CHF pt

## 2023-08-05 MED ORDER — CARVEDILOL 25 MG PO TABS
25.0000 mg | ORAL_TABLET | Freq: Two times a day (BID) | ORAL | 0 refills | Status: DC
Start: 2023-08-05 — End: 2023-12-02

## 2023-08-06 ENCOUNTER — Ambulatory Visit: Attending: Cardiovascular Disease

## 2023-08-06 ENCOUNTER — Telehealth: Payer: Self-pay

## 2023-08-06 DIAGNOSIS — I240 Acute coronary thrombosis not resulting in myocardial infarction: Secondary | ICD-10-CM | POA: Diagnosis not present

## 2023-08-06 LAB — POCT INR: INR: 1.9 — AB (ref 2.0–3.0)

## 2023-08-06 MED ORDER — WARFARIN SODIUM 5 MG PO TABS: ORAL_TABLET | ORAL | 0 refills | Status: DC

## 2023-08-06 NOTE — Telephone Encounter (Signed)
 I saw Mr Bonanno in the Coumadin Clinic today. He states his PET scan has been rescheduled to 08/12/23, and I see where Dr Gala Romney authorized a 1 time dosage of Valium 5mg  for this pt on 07/24/23 per your note in Epic, but pt states it is not at his pharmacy. Unsure if you rx this for the pt his pharmacy is [facility] [address] or if you give it to the pt at the appt on 08/12/23. Please call pt and advise he is anxiously awaiting rx for this upcoming appt. Thanks.

## 2023-08-06 NOTE — Patient Instructions (Signed)
 Description   Take 3 tablets today and then continue taking 2 tablets daily except 3 tablets each Monday, Wednesday, and Friday.  Recheck in 4 weeks.  Call with any questions # [phone].  BILL [id number]

## 2023-08-07 MED ORDER — DIAZEPAM 5 MG PO TABS
5.0000 mg | ORAL_TABLET | Freq: Once | ORAL | 0 refills | Status: AC
Start: 2023-08-07 — End: 2023-08-07

## 2023-08-07 NOTE — Telephone Encounter (Signed)
 [facility] called [facility] Dr Gala Romney

## 2023-08-07 NOTE — Addendum Note (Signed)
 Addended by: Noralee Space on: 08/07/2023 09:27 AM   Modules accepted: Orders

## 2023-08-10 ENCOUNTER — Telehealth (HOSPITAL_COMMUNITY): Payer: Self-pay | Admitting: *Deleted

## 2023-08-10 NOTE — Telephone Encounter (Signed)
Reaching out to patient to offer assistance regarding upcoming cardiac imaging study; pt verbalizes understanding of appt date/time, parking situation and where to check in, pre-test NPO status; name and call back number provided for further questions should they arise  Larey Brick RN Navigator Cardiac Imaging [facility] [phone] office [phone] cell  Patient verbalizes understanding of diet prep.

## 2023-08-12 ENCOUNTER — Encounter (HOSPITAL_COMMUNITY)
Admission: RE | Admit: 2023-08-12 | Discharge: 2023-08-12 | Disposition: A | Discharge status: Home / Self Care | Source: Ambulatory Visit | Attending: Internal Medicine | Admitting: Internal Medicine

## 2023-08-12 DIAGNOSIS — I517 Cardiomegaly: Secondary | ICD-10-CM | POA: Insufficient documentation

## 2023-08-12 DIAGNOSIS — D869 Sarcoidosis, unspecified: Secondary | ICD-10-CM | POA: Diagnosis not present

## 2023-08-12 LAB — NM PET CT MYOCARDIAL SARCOIDOSIS
LV dias vol: 383 mL (ref 62–150)
LV sys vol: 299 mL
Nuc Stress EF: 22 %
Rest Nuclear Isotope Dose: 27.1 mCi

## 2023-08-12 MED ORDER — FLUDEOXYGLUCOSE F - 18 (FDG) INJECTION
8.9900 | Freq: Once | INTRAVENOUS | Status: AC
  Administered 2023-08-12: 8.99 via INTRAVENOUS

## 2023-08-12 MED ORDER — RUBIDIUM RB82 GENERATOR (RUBYFILL)
27.1000 | PACK | Freq: Once | INTRAVENOUS | Status: AC
Start: 2023-08-12 — End: 2023-08-12
  Administered 2023-08-12: 27.1 via INTRAVENOUS

## 2023-08-22 ENCOUNTER — Other Ambulatory Visit: Payer: Self-pay | Admitting: Cardiovascular Disease

## 2023-08-22 DIAGNOSIS — I5043 Acute on chronic combined systolic (congestive) and diastolic (congestive) heart failure: Secondary | ICD-10-CM

## 2023-08-28 ENCOUNTER — Other Ambulatory Visit: Payer: Self-pay | Admitting: Cardiovascular Disease

## 2023-09-03 ENCOUNTER — Ambulatory Visit: Attending: Cardiology

## 2023-09-09 NOTE — Telephone Encounter (Signed)
 PET done on 08/12/23, will close encounter.

## 2023-09-21 ENCOUNTER — Other Ambulatory Visit: Payer: Self-pay

## 2023-09-21 DIAGNOSIS — I240 Acute coronary thrombosis not resulting in myocardial infarction: Secondary | ICD-10-CM

## 2023-09-21 MED ORDER — WARFARIN SODIUM 5 MG PO TABS: ORAL_TABLET | ORAL | 0 refills | Status: DC

## 2023-09-21 NOTE — Telephone Encounter (Signed)
 Received faxed refill request for Warfarin from [facility].  Pt missed last Coumadin  clinic appt on 09/03/23, called pt made follow-up appt for Friday 09/25/23.  Pt states he is out of medication advised pt I will send in enough tablets to get pt to upcoming appt on Friday.  Must keep appt for future refills.

## 2023-09-25 ENCOUNTER — Other Ambulatory Visit: Payer: Self-pay | Admitting: Cardiovascular Disease

## 2023-09-25 ENCOUNTER — Ambulatory Visit: Attending: Cardiovascular Disease

## 2023-09-25 DIAGNOSIS — I240 Acute coronary thrombosis not resulting in myocardial infarction: Secondary | ICD-10-CM

## 2023-09-25 DIAGNOSIS — Z5181 Encounter for therapeutic drug level monitoring: Secondary | ICD-10-CM

## 2023-09-25 DIAGNOSIS — I5043 Acute on chronic combined systolic (congestive) and diastolic (congestive) heart failure: Secondary | ICD-10-CM

## 2023-09-25 LAB — POCT INR: INR: 2.6 (ref 2.0–3.0)

## 2023-09-25 MED ORDER — WARFARIN SODIUM 5 MG PO TABS: ORAL_TABLET | ORAL | 0 refills | Status: DC

## 2023-09-25 NOTE — Patient Instructions (Signed)
continue taking 2 tablets daily except 3 tablets each Monday, Wednesday, and Friday.  Recheck in 6 weeks. Call with any questions # [phone].   BILL [id number]

## 2023-09-28 NOTE — Telephone Encounter (Signed)
 Dr. Leola Raisin Pt. Last OV was in 04/2021. He's had several attempts. He has an appt with Charles Connor PA on 11/26/23. Does Dr. Alvis Ba want to refill? Please advise.

## 2023-09-28 NOTE — Telephone Encounter (Signed)
 Please refill 3 months, no additional refills

## 2023-09-30 MED ORDER — ENTRESTO 97-103 MG PO TABS: 1.0000 | ORAL_TABLET | Freq: Two times a day (BID) | ORAL | 0 refills | Status: DC

## 2023-09-30 NOTE — Addendum Note (Signed)
 Addended by: Akayla Brass L on: 09/30/2023 05:42 PM   Modules accepted: Orders

## 2023-09-30 NOTE — Telephone Encounter (Signed)
 Please refill 3 months, no additional refills    Per Dr Alvis Ba  RX sent

## 2023-10-08 ENCOUNTER — Other Ambulatory Visit: Payer: Self-pay | Admitting: Cardiovascular Disease

## 2023-10-08 DIAGNOSIS — I240 Acute coronary thrombosis not resulting in myocardial infarction: Secondary | ICD-10-CM

## 2023-10-09 ENCOUNTER — Other Ambulatory Visit: Payer: Self-pay | Admitting: *Deleted

## 2023-10-09 ENCOUNTER — Telehealth: Payer: Self-pay | Admitting: Cardiovascular Disease

## 2023-10-09 DIAGNOSIS — I240 Acute coronary thrombosis not resulting in myocardial infarction: Secondary | ICD-10-CM

## 2023-10-09 MED ORDER — WARFARIN SODIUM 5 MG PO TABS: ORAL_TABLET | ORAL | 0 refills | Status: DC

## 2023-10-09 NOTE — Telephone Encounter (Signed)
*  STAT* If patient is at the pharmacy, call can be transferred to refill team.   1. Which medications need to be refilled? (please list name of each medication and dose if known)   warfarin (COUMADIN ) 5 MG tablet     4. Which pharmacy/location (including street and city if local pharmacy) is medication to be sent to?  [id number] - [place], [place] - [address] AT [facility] OF [address]     5. Do they need a 30 day or 90 day supply? 90    Scheduled for 11/26/23

## 2023-10-09 NOTE — Telephone Encounter (Signed)
 Warfarin 5mg  refill Left ventricular thrombus  Last INR 09/25/23 Last OV 02/26/22 & pending appt with Renelda Carry 11/26/23

## 2023-10-09 NOTE — Telephone Encounter (Signed)
 Warfarin 5mg  refill; last sent 09/25/23-5 DAYS Left ventricular thrombus  Last INR 09/25/23 Last OV 02/26/22 & pending appt with Renelda Carry 11/26/23

## 2023-10-21 ENCOUNTER — Other Ambulatory Visit: Payer: Self-pay | Admitting: Cardiovascular Disease

## 2023-10-21 DIAGNOSIS — I5043 Acute on chronic combined systolic (congestive) and diastolic (congestive) heart failure: Secondary | ICD-10-CM

## 2023-10-22 ENCOUNTER — Ambulatory Visit (HOSPITAL_COMMUNITY): Payer: Self-pay | Admitting: Internal Medicine

## 2023-11-06 ENCOUNTER — Other Ambulatory Visit: Payer: Self-pay | Admitting: *Deleted

## 2023-11-06 ENCOUNTER — Ambulatory Visit: Attending: Cardiovascular Disease | Admitting: *Deleted

## 2023-11-06 DIAGNOSIS — I240 Acute coronary thrombosis not resulting in myocardial infarction: Secondary | ICD-10-CM

## 2023-11-06 DIAGNOSIS — Z5181 Encounter for therapeutic drug level monitoring: Secondary | ICD-10-CM | POA: Diagnosis present

## 2023-11-06 LAB — POCT INR: INR: 2.6 (ref 2.0–3.0)

## 2023-11-06 MED ORDER — WARFARIN SODIUM 5 MG PO TABS: ORAL_TABLET | ORAL | 0 refills | Status: DC

## 2023-11-06 NOTE — Progress Notes (Signed)
Please see anticoagulation encounter.

## 2023-11-06 NOTE — Telephone Encounter (Signed)
 Warfarin 5mg  refill Left ventricular thrombus without MI  Last INR today 11/06/23 Last OV 02/26/22-Bensimhon & pending appt with Charles Connor on 11/26/23  Pt states [facility] [address]

## 2023-11-06 NOTE — Patient Instructions (Signed)
Description   Continue taking 2 tablets daily except 3 tablets each Monday, Wednesday, and Friday.   Recheck in 6 weeks.  Call with any questions # [phone].   BILL 99211      
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
## 2023-11-26 ENCOUNTER — Other Ambulatory Visit (HOSPITAL_COMMUNITY): Payer: Self-pay

## 2023-11-26 ENCOUNTER — Ambulatory Visit: Attending: Nurse Practitioner | Admitting: Nurse Practitioner

## 2023-11-26 ENCOUNTER — Encounter: Payer: Self-pay | Admitting: Nurse Practitioner

## 2023-11-26 VITALS — BP 138/96 | HR 74 | Ht 71.5 in | Wt 313.0 lb

## 2023-11-26 DIAGNOSIS — I5042 Chronic combined systolic (congestive) and diastolic (congestive) heart failure: Secondary | ICD-10-CM | POA: Insufficient documentation

## 2023-11-26 DIAGNOSIS — N1831 Chronic kidney disease, stage 3a: Secondary | ICD-10-CM | POA: Insufficient documentation

## 2023-11-26 DIAGNOSIS — Z833 Family history of diabetes mellitus: Secondary | ICD-10-CM | POA: Diagnosis not present

## 2023-11-26 DIAGNOSIS — I428 Other cardiomyopathies: Secondary | ICD-10-CM | POA: Insufficient documentation

## 2023-11-26 DIAGNOSIS — I10 Essential (primary) hypertension: Secondary | ICD-10-CM | POA: Insufficient documentation

## 2023-11-26 DIAGNOSIS — R739 Hyperglycemia, unspecified: Secondary | ICD-10-CM | POA: Diagnosis not present

## 2023-11-26 MED ORDER — AMLODIPINE BESYLATE 5 MG PO TABS
5.0000 mg | ORAL_TABLET | Freq: Every day | ORAL | 1 refills | Status: AC
  Filled 2023-11-26: qty 90, 90d supply, fill #0

## 2023-11-26 MED ORDER — OMRON 3 SERIES BP MONITOR DEVI
1.0000 | Freq: Every day | 0 refills | Status: AC
  Filled 2023-11-26: qty 1, 30d supply, fill #0

## 2023-11-26 NOTE — Patient Instructions (Addendum)
 Medication Instructions:  START Norvasc  5mg a Take 1 tablet once a day  PLEASE STOP BY THE PHARMACY DOWN STAIRS AND PICK UP A BLOOD PRESSURE CUFF *If you need a refill on your cardiac medications before your next appointment, please call your pharmacy*  Lab Work: TODAY-CBC, BMET, BNP, A1C If you have labs (blood work) drawn today and your tests are completely normal, you will receive your results only by: MyChart Message (if you have MyChart) OR A paper copy in the mail If you have any lab test that is abnormal or we need to change your treatment, we will call you to review the results.  Testing/Procedures: Your physician has requested that you have an echocardiogram. Echocardiography is a painless test that uses sound waves to create images of your heart. It provides your doctor with information about the size and shape of your heart and how well your heart's chambers and valves are working. This procedure takes approximately one hour. There are no restrictions for this procedure. Please do NOT wear cologne, perfume, aftershave, or lotions (deodorant is allowed). Please arrive 15 minutes prior to your appointment time.  Please note: We ask at that you not bring children with you during ultrasound (echo/ vascular) testing. Due to room size and safety concerns, children are not allowed in the ultrasound rooms during exams. Our front office staff cannot provide observation of children in our lobby area while testing is being conducted. An adult accompanying a patient to their appointment will only be allowed in the ultrasound room at the discretion of the ultrasound technician under special circumstances. We apologize for any inconvenience.\  Follow-Up: At [facility], you and your health needs are our priority.  As part of our continuing mission to provide you with exceptional heart care, our providers are all part of one team.  This team includes your primary Cardiologist (physician) and  Advanced Practice Providers or APPs (Physician Assistants and Nurse Practitioners) who all work together to provide you with the care you need, when you need it.  Your next appointment:   2 month(s)  Provider:   Jerel Balding, MD or Jackee Alberts, NP   We recommend signing up for the patient portal called MyChart.  Sign up information is provided on this After Visit Summary.  MyChart is used to connect with patients for Virtual Visits (Telemedicine).  Patients are able to view lab/test results, encounter notes, upcoming appointments, etc.  Non-urgent messages can be sent to your provider as well.   To learn more about what you can do with MyChart, go to [email or web address].   Other Instructions: DR POLITE AT [facility]  [address] PHONE: [phone]  CHECK OUT THE DASH DIET  Check your blood pressure daily for 1-2 weeks, then contact the office with your readings, EITHER BY PHONE OR THROUGH MYCHART.  Contact the office either by phone or MyChart with your readings.  Make sure to check your blood pressure 2 hours after taking your medications.   AVOID these things for 30 minutes before checking your blood pressure: No Drinking caffeine. No Drinking alcohol. No Eating. No Smoking. No Exercising.  Five minutes before checking your blood pressure: Pee. Sit in a dining chair. Avoid sitting in a soft couch or armchair. Be quiet. Do not talk.

## 2023-11-27 ENCOUNTER — Ambulatory Visit: Payer: Self-pay | Admitting: Nurse Practitioner

## 2023-11-27 DIAGNOSIS — I5042 Chronic combined systolic (congestive) and diastolic (congestive) heart failure: Secondary | ICD-10-CM

## 2023-11-27 DIAGNOSIS — N1831 Chronic kidney disease, stage 3a: Secondary | ICD-10-CM

## 2023-11-27 LAB — BASIC METABOLIC PANEL WITH GFR
BUN/Creatinine Ratio: 14 (ref 9–20)
BUN: 25 mg/dL — ABNORMAL HIGH (ref 6–20)
CO2: 19 mmol/L — ABNORMAL LOW (ref 20–29)
Calcium: 9.4 mg/dL (ref 8.7–10.2)
Chloride: 102 mmol/L (ref 96–106)
Creatinine, Ser: 1.76 mg/dL — ABNORMAL HIGH (ref 0.76–1.27)
Glucose: 104 mg/dL — ABNORMAL HIGH (ref 70–99)
Potassium: 4.5 mmol/L (ref 3.5–5.2)
Sodium: 139 mmol/L (ref 134–144)
eGFR: 50 mL/min/1.73 — ABNORMAL LOW (ref >59)

## 2023-11-27 LAB — CBC
Hematocrit: 45.4 % (ref 37.5–51.0)
Hemoglobin: 14.9 g/dL (ref 13.0–17.7)
MCH: 29.1 pg (ref 26.6–33.0)
MCHC: 32.8 g/dL (ref 31.5–35.7)
MCV: 89 fL (ref 79–97)
Platelets: 255 x10E3/uL (ref 150–450)
RBC: 5.12 x10E6/uL (ref 4.14–5.80)
RDW: 14.3 % (ref 11.6–15.4)
WBC: 6.1 x10E3/uL (ref 3.4–10.8)

## 2023-11-27 LAB — PRO B NATRIURETIC PEPTIDE: NT-Pro BNP: 66 pg/mL (ref 0–86)

## 2023-11-27 LAB — HEMOGLOBIN A1C
Est. average glucose Bld gHb Est-mCnc: 111 mg/dL
Hgb A1c MFr Bld: 5.5 % (ref 4.8–5.6)

## 2023-11-27 MED ORDER — FUROSEMIDE 20 MG PO TABS: 20.0000 mg | ORAL_TABLET | Freq: Every day | ORAL | 3 refills | Status: DC

## 2023-12-02 ENCOUNTER — Other Ambulatory Visit: Payer: Self-pay | Admitting: Cardiovascular Disease

## 2023-12-02 DIAGNOSIS — I5043 Acute on chronic combined systolic (congestive) and diastolic (congestive) heart failure: Secondary | ICD-10-CM

## 2023-12-07 ENCOUNTER — Other Ambulatory Visit (HOSPITAL_COMMUNITY): Payer: Self-pay

## 2023-12-09 ENCOUNTER — Other Ambulatory Visit: Payer: Self-pay | Admitting: Cardiovascular Disease

## 2023-12-09 DIAGNOSIS — I240 Acute coronary thrombosis not resulting in myocardial infarction: Secondary | ICD-10-CM

## 2023-12-15 ENCOUNTER — Ambulatory Visit

## 2023-12-17 ENCOUNTER — Ambulatory Visit: Attending: Cardiovascular Disease | Admitting: *Deleted

## 2023-12-17 DIAGNOSIS — Z5181 Encounter for therapeutic drug level monitoring: Secondary | ICD-10-CM | POA: Insufficient documentation

## 2023-12-17 DIAGNOSIS — I240 Acute coronary thrombosis not resulting in myocardial infarction: Secondary | ICD-10-CM | POA: Insufficient documentation

## 2023-12-17 LAB — POCT INR: POC INR: 2

## 2023-12-17 MED ORDER — WARFARIN SODIUM 5 MG PO TABS: ORAL_TABLET | ORAL | 0 refills | Status: DC

## 2023-12-17 NOTE — Patient Instructions (Signed)
Description   Continue taking 2 tablets daily except 3 tablets each Monday, Wednesday, and Friday.   Recheck in 6 weeks.  Call with any questions # [phone].   BILL 99211      

## 2023-12-17 NOTE — Progress Notes (Signed)
 INR 2.0 Please see anticoagulation encounter.

## 2024-01-05 ENCOUNTER — Ambulatory Visit (HOSPITAL_COMMUNITY)
Admission: RE | Admit: 2024-01-05 | Discharge: 2024-01-05 | Disposition: A | Discharge status: Home / Self Care | Source: Ambulatory Visit | Attending: Internal Medicine | Admitting: Internal Medicine

## 2024-01-05 DIAGNOSIS — I428 Other cardiomyopathies: Secondary | ICD-10-CM | POA: Diagnosis not present

## 2024-01-05 DIAGNOSIS — I5042 Chronic combined systolic (congestive) and diastolic (congestive) heart failure: Secondary | ICD-10-CM | POA: Diagnosis not present

## 2024-01-05 DIAGNOSIS — I10 Essential (primary) hypertension: Secondary | ICD-10-CM | POA: Diagnosis not present

## 2024-01-05 DIAGNOSIS — N1831 Chronic kidney disease, stage 3a: Secondary | ICD-10-CM | POA: Insufficient documentation

## 2024-01-05 LAB — ECHOCARDIOGRAM COMPLETE
AR max vel: 4.87 cm2
AV Area VTI: 4.61 cm2
AV Area mean vel: 4.81 cm2
AV Mean grad: 4 mmHg
AV Peak grad: 7.2 mmHg
Ao pk vel: 1.34 m/s
Area-P 1/2: 2.79 cm2
S' Lateral: 5.2 cm

## 2024-01-14 MED ORDER — FUROSEMIDE 20 MG PO TABS: 20.0000 mg | ORAL_TABLET | Freq: Two times a day (BID) | ORAL | 3 refills | Status: DC

## 2024-01-19 ENCOUNTER — Telehealth (HOSPITAL_COMMUNITY): Payer: Self-pay

## 2024-01-19 NOTE — Telephone Encounter (Signed)
 Called to confirm/remind patient of their appointment at the Advanced Heart Failure Clinic on 01/20/24.   Appointment:   [] Confirmed  [x] Left mess   [] No answer/No voice mail  [] VM Full/unable to leave message  [] Phone not in service  And to bring all medications and/or complete list.

## 2024-01-20 ENCOUNTER — Inpatient Hospital Stay (HOSPITAL_COMMUNITY)
Admission: RE | Admit: 2024-01-20 | Discharge: 2024-01-20 | Disposition: A | Discharge status: Home / Self Care | Source: Ambulatory Visit

## 2024-01-20 NOTE — Progress Notes (Incomplete)
 ADVANCED HF CLINIC CONSULT NOTE  Referring Physician:Mihai Croitoru, MD Primary Care: Pcp, No Primary Cardiologist: Jerel Balding, MD  HPI:  Shawn [name] is a 38 y.o. 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 Balding for further evaluation of his HF.   Presented with heart failure and EF 35-40% in May 2016 in the setting of severe hypertension.  Had an embolic stroke July 2016 with LV thrombus seen on echo, EF 20% on echo in August 2016.  With medical therapy and anticoagulation, EF improved to 40-45% and LV thrombus resolved on echo January 2017.  Following a period of noncompliance with diet and medications, EF dropped down to 20-25% on echo in June 2019.  Developed DVT of the left lower extremity in July 2020, but while on treatment with Eliquis  developed LV apical thrombus again, so was restarted on warfarin.     Says he feels pretty good as long as he is taking his meds. Takes his kid to school, goes to the store and gets around without problem. Goes to gym 2-3x/week. Walks on TM 61mph/10% without problems. Denies SOB, orthopnea, PND, CP or edema. Ran out of spiro about 3-4 days ago. Doesn't follow BP at home. Occasioanl daytime fatigue. Not sure if he snores. Has not had a sleep study.   No known Fhx of HF.   Echo 1/23 EF 20-25% RV mildly reduced   CPX 2/23  FVC 5.11 (109%)      FEV1 3.84 (100%)        FEV1/FVC 75 (91%)         MVV 172 (99%)  BP rest: 128/72 Standing BP: 118/74 BP peak: 148/76  Peak VO2: 21.7 (81% predicted peak VO2)  VE/VCO2 slope:  45  OUES: 3.29  Peak RER: 0.87  Ventilatory Threshold: 18.5 (69% predicted or measured peak VO2)  Peak RR 53  Peak Ventilation:  114.9  VE/MVV:  67%  PETCO2 at peak:  25  O2pulse:  24   (114%  predicted O2pulse)   Review of Systems: [y] = yes, [ ]  = no   General: Weight gain [ ] ; Weight loss [ ] ; Anorexia [ ] ; Fatigue [ ] ; Fever [ ] ; Chills [ ] ; Weakness [ ]   Cardiac: Chest pain/pressure [ ] ; Resting SOB [ ] ; Exertional SOB [ y]; Orthopnea [ ] ; Pedal Edema [ ] ; Palpitations [ ] ; Syncope [ ] ; Presyncope [ ] ; Paroxysmal nocturnal dyspnea[ ]   Pulmonary: Cough [ ] ; Wheezing[ ] ; Hemoptysis[ ] ; Sputum [ ] ; Snoring [ ]   GI: Vomiting[ ] ; Dysphagia[ ] ; Melena[ ] ; Hematochezia [ ] ; Heartburn[ ] ; Abdominal pain [ ] ; Constipation [ ] ; Diarrhea [ ] ; BRBPR [ ]   GU: Hematuria[ ] ; Dysuria [ ] ; Nocturia[ ]   Vascular: Pain in legs with walking [ ] ; Pain in feet with lying flat [ ] ; Non-healing sores [ ] ; Stroke [ ] ; TIA [ ] ; Slurred speech [ ] ;  Neuro: Headaches[ ] ; Vertigo[ ] ; Seizures[ ] ; Paresthesias[ ] ;Blurred vision [ ] ; Diplopia [ ] ; Vision changes [ ]   Ortho/Skin: Arthritis [ ] ; Joint pain [ ] ; Muscle pain [ ] ; Joint swelling [ ] ; Back Pain [ ] ; Rash [ ]   Psych: Depression[ ] ; Anxiety[ ]   Heme: Bleeding problems [ ] ; Clotting disorders [ ] ; Anemia [ ]   Endocrine: Diabetes [ ] ; Thyroid dysfunction[ ]   Past Medical History:  Diagnosis Date   At risk for sleep apnea    STOP-BANG= 4      SENT TO PCP 11-10-2014   CKD (chronic kidney disease), stage III (HCC)    Dyspnea on exertion    Hyperlipidemia    Hypertension    at age 31   Malignant hypertension    dx age 50   Nonischemic cardiomyopathy (HCC)    Penile adhesions    Systolic and diastolic CHF, chronic (HCC) dx 09-18-2014   cardiologist-  dr jerel croitoru----  ef 25-30% per last note 11-03-2014    Current Outpatient Medications  Medication Sig Dispense Refill   acetaminophen  (TYLENOL ) 500 MG tablet Take 1,000 mg by mouth every 6 (six) hours as needed for mild pain.     amLODipine  (NORVASC ) 5 MG tablet Take 1 tablet (5 mg total) by mouth daily. 180 tablet 1   Blood Pressure Monitoring (OMRON 3 SERIES BP MONITOR) DEVI Use as  directed to monitor blood pressure. 1 each 0   carvedilol  (COREG ) 25 MG tablet TAKE 1 TABLET(25 MG) BY MOUTH TWICE DAILY WITH A MEAL 180 tablet 3   dapagliflozin  propanediol (FARXIGA ) 10 MG TABS tablet Take 1 tablet (10 mg total) by mouth daily before breakfast. (Patient not taking: Reported on 11/26/2023) 30 tablet 11   furosemide  (LASIX ) 20 MG tablet Take 1 tablet (20 mg total) by mouth 2 (two) times daily. 60 tablet 3   sacubitril -valsartan  (ENTRESTO ) 97-103 MG Take 1 tablet by mouth 2 (two) times daily. 180 tablet 0   spironolactone  (ALDACTONE ) 25 MG tablet Take 0.5 tablets (12.5 mg total) by mouth daily. (Patient not taking: Reported on 11/26/2023) 15 tablet 11   warfarin (COUMADIN ) 5 MG tablet TAKE 2 TABLETS TO 3 TABLETS BY MOUTH DAILY AS DIRECTED BY CLINIC. PLEASE KEEP APPOINTMENTS. 85 tablet 0   No current facility-administered medications for this visit.    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  Tobacco Use   Smoking status: Former    Current packs/day: 0.00    Average packs/day: 0.5 packs/day for 10.0 years (5.0 ttl pk-yrs)    Types: Cigarettes    Start date: 04/19/2004    Quit date: 04/19/2014    Years since quitting: 9.7    Passive exposure: Past   Smokeless tobacco: Never  Vaping Use   Vaping status: Never Used  Substance and Sexual Activity   Alcohol use: Yes    Alcohol/week: 0.0 standard drinks of alcohol    Comment: 3 beers per week    Drug use: Yes    Types: Marijuana   Sexual activity: Yes  Other Topics Concern   Not on file  Social History Narrative   Not on file   Social Drivers of Health   Financial Resource Strain: Not on file   Food Insecurity: Not on file  Transportation Needs: Not on file  Physical Activity: Not on file  Stress: Not on file  Social Connections: Not on file  Intimate Partner Violence: Not on file      Family History  Problem Relation Age of  Onset   Diabetes Mother    Hypertension Mother    Kidney disease Brother 74       on dialysis. never went to the doctor     There were no vitals filed for this visit.   PHYSICAL EXAM: General:  Well appearing. No respiratory difficulty HEENT: normal Neck: supple. no JVD. Carotids 2+ bilat; no bruits. No lymphadenopathy or thryomegaly appreciated. Cor: PMI nondisplaced. Regular rate & rhythm. No rubs, gallops or murmurs. Lungs: clear Abdomen: obese soft, nontender, nondistended. No hepatosplenomegaly. No bruits or masses. Good bowel sounds. Extremities: no cyanosis, clubbing, rash, edema Neuro: alert & oriented x 3, cranial nerves grossly intact. moves all 4 extremities w/o difficulty. Affect pleasant.  ASSESSMENT & PLAN:  1. Chronic systolic HF d/t NICM:  - Echo 7983 EF 35-40% - Echo 2017 EF 40-45% - Echo 2019 EF 20-25% on echo in June 2019.  Developed DVT of the left lower extremity in July 2020, but while on treatment with Eliquis  developed LV apical thrombus again, so was restarted on warfarin.   - Echo 1/23 EF 20-25% RV mildly reduced  - Felt to be NICM (has not had cath due to CKD and low risk for CAD) - NYHA II. Volume status ok  - Continue Farxiga  10 - Continue carvedilol  25 bid - Continue spiro 25 - Continue Entresto  97/103 bid - CPX 8/23: moderate HF limitation: pVO2: 21.7 (81% predicted peak VO2); slope 45; pRER: 0.87  - EF not improving with optimal GDMT.  - Check cMRI - Will need to consider ICD at some point - Based on CPX does not qualify for advanced therapies at this time  2. HTN:  - BP improved but still high - will have him follow home BPs  3. Morbid obesity - There is no height or weight on file to calculate  BMI. - Refer to [facility] for possible GLP-1RA  4. CKD 3a - baseline SCr 1.7-1.9 - check labs today  5. H/o CVA d/t LV thrombus - continue warfarin  6. Snoring - Check sleep study  Harlene CHRISTELLA Gainer, FNP  8:09 AM

## 2024-01-28 ENCOUNTER — Ambulatory Visit: Attending: Cardiovascular Disease | Admitting: *Deleted

## 2024-01-28 DIAGNOSIS — Z5181 Encounter for therapeutic drug level monitoring: Secondary | ICD-10-CM | POA: Diagnosis present

## 2024-01-28 DIAGNOSIS — I240 Acute coronary thrombosis not resulting in myocardial infarction: Secondary | ICD-10-CM | POA: Insufficient documentation

## 2024-01-28 LAB — POCT INR: POC INR: 2.6

## 2024-01-28 MED ORDER — WARFARIN SODIUM 5 MG PO TABS: ORAL_TABLET | ORAL | 1 refills | Status: DC

## 2024-01-28 NOTE — Patient Instructions (Signed)
Description   Continue taking 2 tablets daily except 3 tablets each Monday, Wednesday, and Friday.   Recheck in 6 weeks.  Call with any questions # [phone].   BILL 99211      

## 2024-01-28 NOTE — Progress Notes (Signed)
 INR 2.6; Please see anticoagulation encounter Lab Results  Component Value Date   INR 2.6 01/28/2024   INR 2.0 12/17/2023   INR 2.6 11/06/2023    Description   Continue taking 2 tablets daily except 3 tablets each Monday, Wednesday, and Friday.  Recheck in 6 weeks.  Call with any questions # [phone].  BILL [id number]

## 2024-02-01 ENCOUNTER — Ambulatory Visit (HOSPITAL_COMMUNITY)
Admission: RE | Admit: 2024-02-01 | Discharge: 2024-02-01 | Disposition: A | Discharge status: Home / Self Care | Source: Ambulatory Visit | Attending: Physician Assistant

## 2024-02-01 ENCOUNTER — Encounter (HOSPITAL_COMMUNITY): Payer: Self-pay

## 2024-02-01 VITALS — BP 170/110 | HR 76 | Ht 71.0 in | Wt 320.0 lb

## 2024-02-01 DIAGNOSIS — I5022 Chronic systolic (congestive) heart failure: Secondary | ICD-10-CM | POA: Diagnosis not present

## 2024-02-01 DIAGNOSIS — Z7901 Long term (current) use of anticoagulants: Secondary | ICD-10-CM | POA: Insufficient documentation

## 2024-02-01 DIAGNOSIS — Z87891 Personal history of nicotine dependence: Secondary | ICD-10-CM | POA: Insufficient documentation

## 2024-02-01 DIAGNOSIS — Z79899 Other long term (current) drug therapy: Secondary | ICD-10-CM | POA: Insufficient documentation

## 2024-02-01 DIAGNOSIS — Z86718 Personal history of other venous thrombosis and embolism: Secondary | ICD-10-CM | POA: Diagnosis not present

## 2024-02-01 DIAGNOSIS — I10 Essential (primary) hypertension: Secondary | ICD-10-CM

## 2024-02-01 DIAGNOSIS — Z7984 Long term (current) use of oral hypoglycemic drugs: Secondary | ICD-10-CM | POA: Insufficient documentation

## 2024-02-01 DIAGNOSIS — I130 Hypertensive heart and chronic kidney disease with heart failure and stage 1 through stage 4 chronic kidney disease, or unspecified chronic kidney disease: Secondary | ICD-10-CM | POA: Insufficient documentation

## 2024-02-01 DIAGNOSIS — I428 Other cardiomyopathies: Secondary | ICD-10-CM | POA: Diagnosis not present

## 2024-02-01 DIAGNOSIS — Z8673 Personal history of transient ischemic attack (TIA), and cerebral infarction without residual deficits: Secondary | ICD-10-CM | POA: Insufficient documentation

## 2024-02-01 DIAGNOSIS — N1831 Chronic kidney disease, stage 3a: Secondary | ICD-10-CM | POA: Insufficient documentation

## 2024-02-01 DIAGNOSIS — Z6841 Body Mass Index (BMI) 40.0 and over, adult: Secondary | ICD-10-CM | POA: Diagnosis not present

## 2024-02-01 DIAGNOSIS — R0683 Snoring: Secondary | ICD-10-CM | POA: Insufficient documentation

## 2024-02-01 LAB — BASIC METABOLIC PANEL WITH GFR
Anion gap: 11 (ref 5–15)
BUN: 22 mg/dL — ABNORMAL HIGH (ref 6–20)
CO2: 23 mmol/L (ref 22–32)
Calcium: 8.9 mg/dL (ref 8.9–10.3)
Chloride: 106 mmol/L (ref 98–111)
Creatinine, Ser: 1.63 mg/dL — ABNORMAL HIGH (ref 0.61–1.24)
GFR, Estimated: 55 mL/min — ABNORMAL LOW (ref >60)
Glucose, Bld: 91 mg/dL (ref 70–99)
Potassium: 4.2 mmol/L (ref 3.5–5.1)
Sodium: 140 mmol/L (ref 135–145)

## 2024-02-01 LAB — BRAIN NATRIURETIC PEPTIDE: B Natriuretic Peptide: 34.8 pg/mL (ref 0.0–100.0)

## 2024-02-01 MED ORDER — SACUBITRIL-VALSARTAN 97-103 MG PO TABS: 1.0000 | ORAL_TABLET | Freq: Two times a day (BID) | ORAL | 3 refills | Status: AC

## 2024-02-01 MED ORDER — DAPAGLIFLOZIN PROPANEDIOL 10 MG PO TABS: 10.0000 mg | ORAL_TABLET | Freq: Every day | ORAL | 3 refills | Status: AC

## 2024-02-01 MED ORDER — FUROSEMIDE 20 MG PO TABS: 20.0000 mg | ORAL_TABLET | Freq: Every day | ORAL | 3 refills | Status: AC

## 2024-02-01 MED ORDER — SPIRONOLACTONE 25 MG PO TABS: 12.5000 mg | ORAL_TABLET | Freq: Every day | ORAL | 3 refills | Status: AC

## 2024-02-01 NOTE — Patient Instructions (Signed)
 Continue Lasix  20 mg daily - Rx sent. Refills sent on Farxiga , Entresto , and Spiro. Labs today - will call you if abnormal. Referral sent to Pulmonary for management of your sleep apnea - see below. Return to Heart Failure APP Clinic in 2 weeks - see below. Please call us  at [phone] if any issues or concerns prior to your next appointment.

## 2024-02-01 NOTE — Progress Notes (Signed)
 ADVANCED HF CLINIC NOTE   Primary Care: Pcp, No Primary Cardiologist: Jerel Balding, MD HF Cardiologist: Dr. Cherrie  HPI:  Shawn [name] is a 38 y.o. 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   Presented with heart failure and EF 35-40% in May 2016 in the setting of severe hypertension.  Had an embolic stroke July 2016 with LV thrombus seen on echo, EF 20% on echo in August 2016.  With medical therapy and anticoagulation, EF improved to 40-45% and LV thrombus resolved on echo January 2017.  Following a period of noncompliance with diet and medications, EF dropped down to 20-25% on echo in June 2019.  Developed DVT of the left lower extremity in July 2020, but while on treatment with Eliquis  developed LV apical thrombus again, so was restarted on warfarin.    Echo 1/23 EF 20-25% RV mildly reduced   CPX 2/23  FVC 5.11 (109%)      FEV1 3.84 (100%)        FEV1/FVC 75 (91%)         MVV 172 (99%)  BP rest: 128/72 Standing BP: 118/74 BP peak: 148/76  Peak VO2: 21.7 (81% predicted peak VO2)  VE/VCO2 slope:  45  OUES: 3.29  Peak RER: 0.87  Ventilatory Threshold: 18.5 (69% predicted or measured peak VO2)  Peak RR 53  Peak Ventilation:  114.9  VE/MVV:  67%  PETCO2 at peak:  25  O2pulse:  24   (114% predicted O2pulse)   cMRI 07/2022: Severely dilated LV, EF 40%, RVEF 52%, nonspecific LGE pattern (cannot rule out sarcoidosis)  Cardiac PET 03/25: No uptake consistent with sarcoidosis, EF 22%.  Echo 08/25: EF 30-35%, LVIDd 7.7 cm, RV okay  He is here today for overdue CHF follow-up.  He denies shortness of breath, orthopnea, PND or lower extremity edema.  He states that he has been gaining weight over the last 6 months.  He moved back in with his mom a year ago and reports that  she cooks a lot of high calorie meals.  He has been out of Farxiga , Entresto  and spironolactone  for some time.  He does not monitor his blood pressure at home.  He walks on the treadmill at 10% incline, 3 mph, 20 to 25 minutes several days a week without limitation.  Has not completed sleep study.  He has applied for disability and is working with an Pensions consultant.  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  He reports his mother and 3 brothers have heart issues, details not certain.  No relatives have required ICD placement, LVAD or transplant.  Drinks 2 beers a week. No tobacco or drug use.  Past Medical History:  Diagnosis Date   At risk for sleep apnea    STOP-BANG= 4      SENT TO PCP 11-10-2014   CKD (chronic kidney disease), stage III (HCC)    Dyspnea on exertion    Hyperlipidemia    Hypertension    at age 30   Malignant hypertension    dx age 52   Nonischemic cardiomyopathy (HCC)    Penile adhesions    Systolic and diastolic CHF, chronic [facility]) dx 09-18-2014   cardiologist-  dr jerel croitoru----  ef 25-30% per last note 11-03-2014    Current Outpatient Medications  Medication Sig Dispense Refill  acetaminophen  (TYLENOL ) 500 MG tablet Take 1,000 mg by mouth every 6 (six) hours as needed for mild pain.     amLODipine  (NORVASC ) 5 MG tablet Take 1 tablet (5 mg total) by mouth daily. 180 tablet 1   Blood Pressure Monitoring (OMRON 3 SERIES BP MONITOR) DEVI Use as directed to monitor blood pressure. 1 each 0   carvedilol  (COREG ) 25 MG tablet TAKE 1 TABLET(25 MG) BY MOUTH TWICE DAILY WITH A MEAL 180 tablet 3   dapagliflozin  propanediol (FARXIGA ) 10 MG TABS tablet Take 1 tablet (10 mg total) by mouth daily before breakfast. 30 tablet 11   furosemide  (LASIX ) 20 MG tablet Take 1 tablet (20 mg total) by mouth 2 (two) times daily. 60 tablet 3   sacubitril -valsartan  (ENTRESTO ) 97-103 MG Take 1 tablet by mouth 2 (two) times daily. 180 tablet 0   spironolactone  (ALDACTONE ) 25 MG tablet  Take 0.5 tablets (12.5 mg total) by mouth daily. 15 tablet 11   warfarin (COUMADIN ) 5 MG tablet TAKE 2 TABLETS TO 3 TABLETS BY MOUTH DAILY AS DIRECTED BY CLINIC. PLEASE KEEP APPOINTMENTS. 90 tablet 1   No current facility-administered medications for this encounter.    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  Tobacco Use   Smoking status: Former    Current packs/day: 0.00    Average packs/day: 0.5 packs/day for 10.0 years (5.0 ttl pk-yrs)    Types: Cigarettes    Start date: 04/19/2004    Quit date: 04/19/2014    Years since quitting: 9.7    Passive exposure: Past   Smokeless tobacco: Never  Vaping Use   Vaping status: Never Used  Substance and Sexual Activity   Alcohol use: Yes    Alcohol/week: 0.0 standard drinks of alcohol    Comment: 3 beers per week    Drug use: Yes    Types: Marijuana   Sexual activity: Yes  Other Topics Concern   Not on file  Social History Narrative   Not on file   Social Drivers of Health   Financial Resource Strain: Not on file  Food Insecurity: Not on file  Transportation Needs: Not on file  Physical Activity: Not on file  Stress: Not on file  Social Connections: Not on file  Intimate Partner Violence: Not on file      Family History  Problem Relation Age of Onset   Diabetes Mother    Hypertension Mother    Kidney disease Brother 32       on dialysis. never went to the doctor     Vitals:   02/01/24 1539  BP: (!) 170/110  Pulse: 76  SpO2: 99%  Weight: (!) 145.2 kg (320 lb)  Height: 5' 11 (1.803 m)     PHYSICAL EXAM: General:  Well appearing.  Cor: JVP difficult to assess, thick neck. Regular rate &  rhythm. No murmur. Lungs: clear Abdomen: obese, soft, nontender, nondistended.  Extremities: no edema Neuro: alert & orientedx3. Affect pleasant   ASSESSMENT & PLAN:  1. Chronic systolic HF d/t NICM:  - Echo 7983 EF 35-40% - Echo 2017 EF 40-45% -  Echo 2019 EF 20-25% on echo in June 2019.  Developed DVT of the left lower extremity in July 2020, but while on treatment with Eliquis  developed LV apical thrombus again, so was restarted on warfarin.   - Echo 1/23 EF 20-25% RV mildly reduced  - CPX 8/23: moderate HF limitation: pVO2: 21.7 (81% predicted peak VO2); slope 45; pRER: 0.87  - cMRI 07/2022: Severely dilated LV, EF 40%, RVEF 52%, nonspecific LGE pattern (cannot rule out sarcoidosis) - Cardiac PET 03/25: No uptake consistent with sarcoidosis, EF 22%. - Echo 08/25: EF 30-35%, LVIDd 7.7 cm, RV okay - Felt to be NICM (has not had cath due to CKD and low risk for CAD) - NYHA II. Volume looks good. Prescribed lasix  20 BID but taking 20 mg daily (continue this dose).  - Restart Farxiga  - Continue carvedilol  25 bid - Restart spiro 25 - Restart Entresto  97/103 bid - Will need to consider ICD at some point. Reassess LV function on GDMT - Based on CPX does not qualify for advanced therapies at this time  2. HTN:  - BP uncontrolled.  - Restart meds as above  3. Morbid obesity - Body mass index is 44.63 kg/m. - Lost to follow-up with healthy weight and wellness. He states he is motivated to make some lifestyle changes and will call the clinic for follow-up.  4. CKD 3a - baseline SCr 1.7-1.9 - Labs today  5. H/o CVA d/t LV thrombus - continue warfarin  6. Snoring - Refer to pulmonary for sleep study  Follow-up: 2 weeks with APP for medication titration  Ephriam Turman N, PA-C  3:48 PM

## 2024-02-05 ENCOUNTER — Ambulatory Visit (HOSPITAL_COMMUNITY): Payer: Self-pay | Admitting: Physician Assistant

## 2024-02-09 ENCOUNTER — Encounter: Payer: Self-pay | Admitting: *Deleted

## 2024-02-09 NOTE — Progress Notes (Signed)
 Cardiology Office Note    Patient Name: Shawn [name] Date of Encounter: 02/09/2024  Primary Care Provider:  Pcp, No Primary Cardiologist:  Jerel Balding, MD Primary Electrophysiologist: None   Past Medical History    Past Medical History:  Diagnosis Date   At risk for sleep apnea    STOP-BANG= 4      SENT TO PCP 11-10-2014   CKD (chronic kidney disease), stage III (HCC)    Dyspnea on exertion    Hyperlipidemia    Hypertension    at age 38   Malignant hypertension    dx age 4   Nonischemic cardiomyopathy (HCC)    Penile adhesions    Systolic and diastolic CHF, chronic (HCC) dx 09-18-2014   cardiologist-  dr jerel croitoru----  ef 25-30% per last note 11-03-2014    History of Present Illness  Shawn [name] is a 38 y.o. 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  Shawn [name] was last seen on 11/26/2023 for an overdue follow-up.  During his visit his blood pressure was elevated and patient was started on amlodipine  5 mg daily.  He had been off of Farxiga  and spironolactone  and had not completed an updated 2D echo.  2D echo was completed showing reduced EF of 30-35% with global hypokinesis and mild concentric LVH.  He was referred to the advanced heart heart failure clinic and was seen on 01/28/2024 for follow-up.  During his visit he reported no shortness of breath but endorsed weight gain over previous 6 months.  He reported being out of all medications did not monitor his blood pressure at home.  He was continued on Lasix  20 mg daily and restarted on Farxiga , carvedilol , spironolactone , Entresto .  Shawn [name] presents today for 43-month follow-up. He has a history of heart failure and is currently on spironolactone , Farxiga , Lasix , and amlodipine . He feels good overall and has resumed taking his  medications. He experienced weight gain over the past six months but has since lost weight, currently weighing 314 pounds, down from 320 pounds. He attributes some of his fatigue to dietary habits and is actively trying to adjust his diet to improve his energy levels. He reports occasional swelling in his ankles, which he associates with dietary intake, particularly after consuming shrimp. He is taking Lasix  20 mg daily. He has transitioned back to living with his parents and is not currently working, having previously worked for [employer]. He is focused on lifestyle changes, including dietary adjustments, to manage his weight and health. He is staying hydrated, aiming for 64 ounces of water daily, and is mindful of his food choices, opting for items like watermelon, skinless chicken, and brown rice. He is on Coumadin  and is aware of dietary restrictions related to this medication, such as avoiding greens like kale and spinach. He has not experienced any issues with medication access since switching to a more convenient pharmacy location.  In terms of light places to pick up limited doctors over the he has been referred for a sleep study and has an upcoming appointment with a nephrologist in October due to elevated kidney function. He is interested in weight management support. No shortness of breath and occasional fatigue related to diet.  Discussed the use of AI scribe software for clinical note transcription with the patient, who gave verbal consent to proceed.  History  of Present Illness   Review of Systems  Please see the history of present illness.    All other systems reviewed and are otherwise negative except as noted above.  Physical Exam    Wt Readings from Last 3 Encounters:  02/01/24 (!) 320 lb (145.2 kg)  11/26/23 (!) 313 lb (142 kg)  03/20/22 298 lb 9.6 oz (135.4 kg)   CD:Uyzmz were no vitals filed for this visit.,There is no height or weight on file to calculate BMI. GEN: Well  nourished, well developed in no acute distress Neck: No JVD; No carotid bruits Pulmonary: Clear to auscultation without rales, wheezing or rhonchi  Cardiovascular: Normal rate. Regular rhythm. Normal S1. Normal S2.   Murmurs: There is no murmur.  ABDOMEN: Soft, non-tender, non-distended EXTREMITIES: Bilateral +1 lower extremity edema  EKG/LABS/ Recent Cardiac Studies   ECG personally reviewed by me today -none completed today  Risk Assessment/Calculations:          Lab Results  Component Value Date   WBC 6.1 11/26/2023   HGB 14.9 11/26/2023   HCT 45.4 11/26/2023   MCV 89 11/26/2023   PLT 255 11/26/2023   Lab Results  Component Value Date   CREATININE 1.63 (H) 02/01/2024   BUN 22 (H) 02/01/2024   NA 140 02/01/2024   K 4.2 02/01/2024   CL 106 02/01/2024   CO2 23 02/01/2024   Lab Results  Component Value Date   CHOL 172 07/22/2021   HDL 32 (L) 07/22/2021   LDLCALC 95 07/22/2021   TRIG 265 (H) 07/22/2021   CHOLHDL 5.4 (H) 07/22/2021    Lab Results  Component Value Date   HGBA1C 5.5 11/26/2023   Assessment & Plan    Assessment & Plan  1.Chronic combined CHF/NICM:  - 2D echo was completed with EF of 30-35% global hypokinesis with mild LVH with normal LV filling pressure and mildly enlarged RV - Recently establish care with advanced heart failure clinic and restarted on majority of GDMT - Today reports compliance with current medications and denies any missed doses. - He is euvolemic on exam with trace swelling in his lower extremities but reports no shortness of breath with exertion. - He is scheduled to follow-up with the advanced heart failure clinic next month. - Continue current GDMT with carvedilol  25 mg twice daily, Farxiga  10 mg daily, Entresto  97/103 mg twice daily spironolactone  12.5 mg daily  2.  Essential hypertension: - Patient's blood pressure today was improved at 122/78 - Continue spironolactone  12.5 mg, Entresto  97/73 mg twice daily, carvedilol  25  mg twice daily and Norvasc  5 mg daily  3. History of CVA/DVT: - Patient currently on warfarin and reports no residual effects of CVA or DVT - Continue follow-up with Coumadin  clinic  4.CKD stage IIIa:  -Slightly elevated kidney function. Nephrology referral made. - Follow up with nephrology in October.  5.  Morbid obesity: - Patient's BMI was 43.18 kg/m Weight reduced from 320 lbs to 314 lbs. Motivated for further weight loss. Discussed potential sleep apnea and weight loss program referral. - Encourage continued dietary modifications and hydration. - Provide contact information for sleep study and weight loss program. - Follow up with heart failure clinic to discuss weight loss program.  Disposition: Follow-up with Jerel Balding, MD or APP in 2 months    Signed, Wyn Raddle, Jackee Shove, NP 02/09/2024, 5:58 PM [facility]

## 2024-02-10 ENCOUNTER — Encounter: Payer: Self-pay | Admitting: Nurse Practitioner

## 2024-02-10 ENCOUNTER — Ambulatory Visit: Attending: Cardiology | Admitting: Nurse Practitioner

## 2024-02-10 VITALS — BP 122/78 | HR 76 | Ht 71.5 in | Wt 314.0 lb

## 2024-02-10 DIAGNOSIS — I428 Other cardiomyopathies: Secondary | ICD-10-CM | POA: Insufficient documentation

## 2024-02-10 DIAGNOSIS — Z8673 Personal history of transient ischemic attack (TIA), and cerebral infarction without residual deficits: Secondary | ICD-10-CM | POA: Insufficient documentation

## 2024-02-10 DIAGNOSIS — I5022 Chronic systolic (congestive) heart failure: Secondary | ICD-10-CM | POA: Diagnosis not present

## 2024-02-10 DIAGNOSIS — I10 Essential (primary) hypertension: Secondary | ICD-10-CM | POA: Diagnosis not present

## 2024-02-10 DIAGNOSIS — N1831 Chronic kidney disease, stage 3a: Secondary | ICD-10-CM | POA: Diagnosis not present

## 2024-02-10 NOTE — Patient Instructions (Addendum)
 Medication Instructions:  Your physician recommends that you continue on your current medications as directed. Please refer to the Current Medication list given to you today. *If you need a refill on your cardiac medications before your next appointment, please call your pharmacy*  Lab Work: None ordered If you have labs (blood work) drawn today and your tests are completely normal, you will receive your results only by: MyChart Message (if you have MyChart) OR A paper copy in the mail If you have any lab test that is abnormal or we need to change your treatment, we will call you to review the results.  Testing/Procedures: None Ordered  Follow-Up: At [facility], you and your health needs are our priority.  As part of our continuing mission to provide you with exceptional heart care, our providers are all part of one team.  This team includes your primary Cardiologist (physician) and Advanced Practice Providers or APPs (Physician Assistants and Nurse Practitioners) who all work together to provide you with the care you need, when you need it.  Your next appointment:   2 month(s)  Provider:   Jerel Balding, MD or APP   We recommend signing up for the patient portal called MyChart.  Sign up information is provided on this After Visit Summary.  MyChart is used to connect with patients for Virtual Visits (Telemedicine).  Patients are able to view lab/test results, encounter notes, upcoming appointments, etc.  Non-urgent messages can be sent to your provider as well.   To learn more about what you can do with MyChart, go to [email or web address].   Other Instructions [facility] [phone]

## 2024-02-22 ENCOUNTER — Telehealth (HOSPITAL_COMMUNITY): Payer: Self-pay

## 2024-02-22 NOTE — Progress Notes (Incomplete)
 ADVANCED HF CLINIC NOTE   Primary Care: Pcp, No Primary Cardiologist: Jerel Balding, MD HF Cardiologist: Dr. Cherrie  HPI:  Shawn [name] is a 38 y.o. 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   Presented with heart failure and EF 35-40% in May 2016 in the setting of severe hypertension.  Had an embolic stroke July 2016 with LV thrombus seen on echo, EF 20% on echo in August 2016.  With medical therapy and anticoagulation, EF improved to 40-45% and LV thrombus resolved on echo January 2017.  Following a period of noncompliance with diet and medications, EF dropped down to 20-25% on echo in June 2019.  Developed DVT of the left lower extremity in July 2020, but while on treatment with Eliquis  developed LV apical thrombus again, so was restarted on warfarin.    Echo 1/23 EF 20-25% RV mildly reduced   CPX 2/23  FVC 5.11 (109%)      FEV1 3.84 (100%)        FEV1/FVC 75 (91%)         MVV 172 (99%)  BP rest: 128/72 Standing BP: 118/74 BP peak: 148/76  Peak VO2: 21.7 (81% predicted peak VO2)  VE/VCO2 slope:  45  OUES: 3.29  Peak RER: 0.87  Ventilatory Threshold: 18.5 (69% predicted or measured peak VO2)  Peak RR 53  Peak Ventilation:  114.9  VE/MVV:  67%  PETCO2 at peak:  25  O2pulse:  24   (114% predicted O2pulse)   cMRI 07/2022: Severely dilated LV, EF 40%, RVEF 52%, nonspecific LGE pattern (cannot rule out sarcoidosis)  Cardiac PET 03/25: No uptake consistent with sarcoidosis, EF 22%.  Echo 08/25: EF 30-35%, LVIDd 7.7 cm, RV okay  He is here today for overdue CHF follow-up.  He denies shortness of breath, orthopnea, PND or lower extremity edema.  He states that he has been gaining weight over the last 6 months.  He moved back in with his mom a year ago and reports that  she cooks a lot of high calorie meals.  He has been out of Farxiga , Entresto  and spironolactone  for some time.  He does not monitor his blood pressure at home.  He walks on the treadmill at 10% incline, 3 mph, 20 to 25 minutes several days a week without limitation.  Has not completed sleep study.  He has applied for disability and is working with an Pensions consultant.  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  He reports his mother and 3 brothers have heart issues, details not certain.  No relatives have required ICD placement, LVAD or transplant.  Drinks 2 beers a week. No tobacco or drug use.  Past Medical History:  Diagnosis Date   At risk for sleep apnea    STOP-BANG= 4      SENT TO PCP 11-10-2014   CKD (chronic kidney disease), stage III (HCC)    Dyspnea on exertion    Hyperlipidemia    Hypertension    at age 58   Malignant hypertension    dx age 89   Nonischemic cardiomyopathy (HCC)    Penile adhesions    Systolic and diastolic CHF, chronic [facility]) dx 09-18-2014   cardiologist-  dr jerel croitoru----  ef 25-30% per last note 11-03-2014    Current Outpatient Medications  Medication Sig Dispense Refill  acetaminophen  (TYLENOL ) 500 MG tablet Take 1,000 mg by mouth every 6 (six) hours as needed for mild pain.     amLODipine  (NORVASC ) 5 MG tablet Take 1 tablet (5 mg total) by mouth daily. 180 tablet 1   Blood Pressure Monitoring (OMRON 3 SERIES BP MONITOR) DEVI Use as directed to monitor blood pressure. 1 each 0   carvedilol  (COREG ) 25 MG tablet TAKE 1 TABLET(25 MG) BY MOUTH TWICE DAILY WITH A MEAL 180 tablet 3   dapagliflozin  propanediol (FARXIGA ) 10 MG TABS tablet Take 1 tablet (10 mg total) by mouth daily before breakfast. 90 tablet 3   furosemide  (LASIX ) 20 MG tablet Take 1 tablet (20 mg total) by mouth daily. 90 tablet 3   sacubitril -valsartan  (ENTRESTO ) 97-103 MG Take 1 tablet by mouth 2 (two) times daily. 180 tablet 3   spironolactone  (ALDACTONE ) 25 MG tablet Take 0.5  tablets (12.5 mg total) by mouth daily. 45 tablet 3   warfarin (COUMADIN ) 5 MG tablet TAKE 2 TABLETS TO 3 TABLETS BY MOUTH DAILY AS DIRECTED BY CLINIC. PLEASE KEEP APPOINTMENTS. 90 tablet 1   No current facility-administered medications for this visit.    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  Tobacco Use   Smoking status: Former    Current packs/day: 0.00    Average packs/day: 0.5 packs/day for 10.0 years (5.0 ttl pk-yrs)    Types: Cigarettes    Start date: 04/19/2004    Quit date: 04/19/2014    Years since quitting: 9.8    Passive exposure: Past   Smokeless tobacco: Never  Vaping Use   Vaping status: Never Used  Substance and Sexual Activity   Alcohol use: Yes    Alcohol/week: 0.0 standard drinks of alcohol    Comment: 3 beers per week    Drug use: Yes    Types: Marijuana   Sexual activity: Yes  Other Topics Concern   Not on file  Social History Narrative   Not on file   Social Drivers of Health   Financial Resource Strain: Not on file  Food Insecurity: Not on file  Transportation Needs: Not on file  Physical Activity: Not on file  Stress: Not on file  Social Connections: Not on file  Intimate Partner Violence: Not on file      Family History  Problem Relation Age of Onset   Diabetes Mother    Hypertension Mother    Kidney disease Brother 39       on dialysis. never went to the doctor     There were no vitals filed for this visit.    PHYSICAL EXAM: General:  Well appearing.  Cor: JVP difficult to assess, thick neck. Regular rate & rhythm. No murmur. Lungs: clear Abdomen: obese, soft, nontender, nondistended.  Extremities: no  edema Neuro: alert & orientedx3. Affect pleasant   ASSESSMENT & PLAN:  1. Chronic systolic HF d/t NICM:  - Echo 7983 EF 35-40% - Echo 2017 EF 40-45% - Echo 2019 EF 20-25% on echo in June 2019.  Developed DVT of the left lower extremity in July 2020, but while on  treatment with Eliquis  developed LV apical thrombus again, so was restarted on warfarin.   - Echo 1/23 EF 20-25% RV mildly reduced  - CPX 8/23: moderate HF limitation: pVO2: 21.7 (81% predicted peak VO2); slope 45; pRER: 0.87  - cMRI 07/2022: Severely dilated LV, EF 40%, RVEF 52%, nonspecific LGE pattern (cannot rule out sarcoidosis) - Cardiac PET 03/25: No uptake consistent with sarcoidosis, EF 22%. - Echo 08/25: EF 30-35%, LVIDd 7.7 cm, RV okay - Felt to be NICM (has not had cath due to CKD and low risk for CAD) - NYHA II. Volume looks good. Prescribed lasix  20 BID but taking 20 mg daily (continue this dose).  - Restart Farxiga  - Continue carvedilol  25 bid - Restart spiro 25 - Restart Entresto  97/103 bid - Will need to consider ICD at some point. Reassess LV function on GDMT - Based on CPX does not qualify for advanced therapies at this time  2. HTN:  - BP uncontrolled.  - Restart meds as above  3. Morbid obesity - There is no height or weight on file to calculate BMI. - Lost to follow-up with healthy weight and wellness. He states he is motivated to make some lifestyle changes and will call the clinic for follow-up.  4. CKD 3a - baseline SCr 1.7-1.9 - Labs today  5. H/o CVA d/t LV thrombus - continue warfarin  6. Snoring - Refer to pulmonary for sleep study  Follow-up: 2 weeks with APP for medication titration  Harlene CHRISTELLA Gainer, FNP  1:21 PM

## 2024-02-22 NOTE — Telephone Encounter (Signed)
 Called to confirm/remind patient of their appointment at the Advanced Heart Failure Clinic on 02/23/24.   Appointment:   [] Confirmed  [x] Left mess   [] No answer/No voice mail  [] VM Full/unable to leave message  [] Phone not in service  And to bring in all medications and/or complete list.

## 2024-02-23 ENCOUNTER — Encounter (HOSPITAL_COMMUNITY)

## 2024-03-10 ENCOUNTER — Ambulatory Visit

## 2024-03-11 ENCOUNTER — Ambulatory Visit: Attending: Cardiovascular Disease | Admitting: *Deleted

## 2024-03-11 DIAGNOSIS — I240 Acute coronary thrombosis not resulting in myocardial infarction: Secondary | ICD-10-CM | POA: Diagnosis present

## 2024-03-11 DIAGNOSIS — Z5181 Encounter for therapeutic drug level monitoring: Secondary | ICD-10-CM | POA: Diagnosis present

## 2024-03-11 LAB — POCT INR: INR: 2 (ref 2.0–3.0)

## 2024-03-11 NOTE — Progress Notes (Signed)
 Description   INR-2.0; Continue taking 2 tablets daily except 3 tablets each Monday, Wednesday, and Friday.  Recheck in 6 weeks.  Call with any questions # [phone].  BILL [id number]

## 2024-03-11 NOTE — Patient Instructions (Signed)
 Description   INR-2.0; Continue taking 2 tablets daily except 3 tablets each Monday, Wednesday, and Friday.  Recheck in 6 weeks.  Call with any questions # [phone].  BILL [id number]

## 2024-03-20 NOTE — Progress Notes (Deleted)
 Cardiology Clinic Note   Patient Name: Shawn [name] Date of Encounter: 03/20/2024  Primary Care Provider:  Pcp, No Primary Cardiologist:  Shawn Balding, MD  Patient Profile    Shawn [name] 38 year old male presents to the clinic today for follow-up evaluation of his chronic systolic CHF and nonischemic cardiomyopathy.  Past Medical History    Past Medical History:  Diagnosis Date   At risk for sleep apnea    STOP-BANG= 4      SENT TO PCP 11-10-2014   CKD (chronic kidney disease), stage III (HCC)    Dyspnea on exertion    Hyperlipidemia    Hypertension    at age 89   Malignant hypertension    dx age 89   Nonischemic cardiomyopathy (HCC)    Penile adhesions    Systolic and diastolic CHF, chronic (HCC) dx 09-18-2014   cardiologist-  dr Shawn [name]----  ef 25-30% per last note 11-03-2014   Past Surgical History:  Procedure Laterality Date   CIRCUMCISION REVISION N/A 11/14/2014   Procedure: CIRCUMCISION REVISION, LYSIS OF ADHESIONS;  Surgeon: Shawn Oiler, MD;  Location: [facility];  Service: Urology;  Laterality: N/A;   NO PAST SURGERIES     TRANSTHORACIC ECHOCARDIOGRAM  09-19-2014  dr [name]   moderate LVH,  ef 35% (per dr [name] note, ef 25-30%), diffuse hypokinesis of basal inferior myocardium,  trivial AR,  mild MR    Allergies  Allergies  Allergen Reactions   Isosorbide  Nitrate Other (See Comments)    headaches   Lisinopril  Cough    History of Present Illness    Shawn [name] has a PMH of chronic combined systolic and diastolic CHF, nonischemic cardiomyopathy, CKD stage III, 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  He was seen in follow-up 7/25.  He was noted to have elevated blood pressure and was started on amlodipine  5 mg daily.  He had been off of his Farxiga  and spironolactone .  He had not completed recommended echocardiogram.  He did have echocardiogram  which subsequently showed an EF of 30-35% with global hypokinesis and mild concentric LVH.  He was referred to advanced heart failure team.  He was seen 01/28/2024 for follow-up.  During that time he reported no shortness of breath but endorsed weight gain over 6 months.  He noted that he was out of all of his medications.  He was not monitoring his blood pressure at home.  He was continued on furosemide  20 mg daily and restarted on Farxiga , carvedilol , spironolactone , and Entresto .  He was seen in follow-up by Wyn NP on 02/10/2024.  He reported compliance with his medications.  He was feeling well overall.  His weight had gone down 6 pounds.  It was noted to be 314.  He attributed some of his fatigue to dietary habits.  He noted occasional swelling in his ankles which he associated with his dietary intake.  He was taking Lasix  20 mg daily.  He reported that he had transition back to living with his parents.  He was not working at that time.  He was trying to stay well-hydrated, trying to drink 64 ounces daily.  He was working on eating a heart healthy diet.  He reported compliance with his Coumadin .  He was avoiding leafy greens.  He was interested in weight management.  He denied shortness of breath and did note occasional fatigue which he related to his diet.  Follow-up  was planned for 2 months.  He presents to the clinic today for follow-up evaluation and states***.  *** denies chest pain, shortness of breath, lower extremity edema, fatigue, palpitations, melena, hematuria, hemoptysis, diaphoresis, weakness, presyncope, syncope, orthopnea, and PND.  Chronic combined systolic and diastolic CHF, nonischemic cardiomyopathy-no increased DOE or activity intolerance.  Reestablish care with advanced heart failure team.  He was restarted on his medications.  On follow-up in September he reported medication compliance.  His medications were continued. Continue spironolactone , Entresto , carvedilol , Norvasc  Heart  healthy low-sodium diet Increase physical activity as tolerated Elevate lower extremities when not active  Essential hypertension-BP today***. Maintain blood pressure log Continue current medical therapy  History of CVA, DVT-neurologically intact.  Denies recent lower extremity unilateral edema or erythema.  Compliant with Coumadin . Continue to follow-up with Coumadin  clinic  Morbid obesity-weight today***. Reduced calorie diet Maintain p.o. hydration Increase physical activity as tolerated, continue weight loss  Disposition: Follow-up with Dr. Francyne or me in 4 to 6 months.  Home Medications    Prior to Admission medications   Medication Sig Start Date End Date Taking? Authorizing Provider  acetaminophen  (TYLENOL ) 500 MG tablet Take 1,000 mg by mouth every 6 (six) hours as needed for mild pain.    [provider]  amLODipine  (NORVASC ) 5 MG tablet Take 1 tablet (5 mg total) by mouth daily. 11/26/23 02/24/24  Shawn Jackee VEAR Shawn [name]., NP  Blood Pressure Monitoring (OMRON 3 SERIES BP MONITOR) DEVI Use as directed to monitor blood pressure. 11/26/23   Shawn Jackee VEAR Shawn [name]., NP  carvedilol  (COREG ) 25 MG tablet TAKE 1 TABLET(25 MG) BY MOUTH TWICE DAILY WITH A MEAL 12/02/23   [name], Jerel, MD  dapagliflozin  propanediol (FARXIGA ) 10 MG TABS tablet Take 1 tablet (10 mg total) by mouth daily before breakfast. 02/01/24   Colletta Shawn Garre, PA-C  furosemide  (LASIX ) 20 MG tablet Take 1 tablet (20 mg total) by mouth daily. 02/01/24 05/01/24  Colletta Shawn Garre, PA-C  sacubitril -valsartan  (ENTRESTO ) 97-103 MG Take 1 tablet by mouth 2 (two) times daily. 02/01/24   Colletta Shawn Garre, PA-C  spironolactone  (ALDACTONE ) 25 MG tablet Take 0.5 tablets (12.5 mg total) by mouth daily. 02/01/24   Colletta Shawn Garre, PA-C  warfarin (COUMADIN ) 5 MG tablet TAKE 2 TABLETS TO 3 TABLETS BY MOUTH DAILY AS DIRECTED BY CLINIC. PLEASE KEEP APPOINTMENTS. 01/28/24   Shawn Jackee VEAR Shawn [name]., NP    Family History     Family History  Problem Relation Age of Onset   Diabetes Mother    Hypertension Mother    Kidney disease Brother 20       on dialysis. never went to the doctor    He indicated that his mother is alive. He indicated that his father is alive. He indicated that his brother is alive.  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  Tobacco Use   Smoking status: Former    Current packs/day: 0.00    Average packs/day: 0.5 packs/day for 10.0 years (5.0 ttl pk-yrs)    Types: Cigarettes    Start date: 04/19/2004    Quit date: 04/19/2014    Years since quitting: 9.9    Passive exposure: Past   Smokeless tobacco:  Never  Vaping Use   Vaping status: Never Used  Substance and Sexual Activity   Alcohol use: Yes    Alcohol/week: 0.0 standard drinks of alcohol    Comment: 3 beers per week    Drug use: Yes    Types: Marijuana   Sexual activity: Yes  Other Topics Concern   Not on file  Social History Narrative   Not on file   Social Drivers of Health   Financial Resource Strain: Not on file  Food Insecurity: Not on file  Transportation Needs: Not on file  Physical Activity: Not on file  Stress: Not on file  Social Connections: Not on file  Intimate Partner Violence: Not on file     Review of Systems    General:  No chills, fever, night sweats or weight changes.  Cardiovascular:  No chest pain, dyspnea on exertion, edema, orthopnea, palpitations, paroxysmal nocturnal dyspnea. Dermatological: No rash, lesions/masses Respiratory: No cough, dyspnea Urologic: No hematuria, dysuria Abdominal:   No nausea, vomiting, diarrhea, bright red blood per rectum, melena, or hematemesis Neurologic:  No visual changes, wkns, changes in mental status. All other systems  reviewed and are otherwise negative except as noted above.  Physical Exam    VS:  There were no vitals taken for this visit. , BMI There is no height or weight on file to calculate BMI. GEN: Well nourished, well developed, in no acute distress. HEENT: normal. Neck: Supple, no JVD, carotid bruits, or masses. Cardiac: RRR, no murmurs, rubs, or gallops. No clubbing, cyanosis, edema.  Radials/DP/PT 2+ and equal bilaterally.  Respiratory:  Respirations regular and unlabored, clear to auscultation bilaterally. GI: Soft, nontender, nondistended, BS + x 4. MS: no deformity or atrophy. Skin: warm and dry, no rash. Neuro:  Strength and sensation are intact. Psych: Normal affect.  Accessory Clinical Findings    Recent Labs: 11/26/2023: Hemoglobin 14.9; NT-Pro BNP 66; Platelets 255 02/01/2024: B Natriuretic Peptide 34.8; BUN 22; Creatinine, Ser 1.63; Potassium 4.2; Sodium 140   Recent Lipid Panel    Component Value Date/Time   CHOL 172 07/22/2021 1112   TRIG 265 (H) 07/22/2021 1112   HDL 32 (L) 07/22/2021 1112   CHOLHDL 5.4 (H) 07/22/2021 1112   CHOLHDL 5.3 11/18/2014 0133   VLDL 27 11/18/2014 0133   LDLCALC 95 07/22/2021 1112    No BP recorded.  {Refresh Note OR Click here to enter BP  :1}***    ECG personally reviewed by me today- ***    Echocardiogram 01/05/2024  IMPRESSIONS     1. Left ventricular ejection fraction, by estimation, is 30 to 35%. The  left ventricle has moderately decreased function. The left ventricle  demonstrates global hypokinesis. The left ventricular internal cavity size  was severely dilated. There is mild  concentric left ventricular hypertrophy. Left ventricular diastolic  parameters are indeterminate.   2. Right ventricular systolic function is normal. The right ventricular  size is mildly enlarged. Tricuspid regurgitation signal is inadequate for  assessing PA pressure.   3. The mitral valve is normal in structure. Trivial mitral valve   regurgitation. No evidence of mitral stenosis.   4. The aortic valve is normal in structure. Aortic valve regurgitation is  not visualized. No aortic stenosis is present.   5. The inferior vena cava is normal in size with greater than 50%  respiratory variability, suggesting right atrial pressure of 3 mmHg.   FINDINGS   Left Ventricle: Left ventricular ejection fraction, by estimation, is 30  to 35%.  The left ventricle has moderately decreased function. The left  ventricle demonstrates global hypokinesis. The left ventricular internal  cavity size was severely dilated.  There is mild concentric left ventricular hypertrophy. Left ventricular  diastolic parameters are indeterminate. Normal left ventricular filling  pressure.   Right Ventricle: The right ventricular size is mildly enlarged. No  increase in right ventricular wall thickness. Right ventricular systolic  function is normal. Tricuspid regurgitation signal is inadequate for  assessing PA pressure.   Left Atrium: Left atrial size was normal in size.   Right Atrium: Right atrial size was normal in size.   Pericardium: There is no evidence of pericardial effusion.   Mitral Valve: The mitral valve is normal in structure. Trivial mitral  valve regurgitation. No evidence of mitral valve stenosis.   Tricuspid Valve: The tricuspid valve is normal in structure. Tricuspid  valve regurgitation is not demonstrated. No evidence of tricuspid  stenosis.   Aortic Valve: The aortic valve is normal in structure. Aortic valve  regurgitation is not visualized. No aortic stenosis is present. Aortic  valve mean gradient measures 4.0 mmHg. Aortic valve peak gradient measures  7.2 mmHg. Aortic valve area, by VTI  measures 4.61 cm.   Pulmonic Valve: The pulmonic valve was normal in structure. Pulmonic valve  regurgitation is trivial. No evidence of pulmonic stenosis.   Aorta: The aortic root is normal in size and structure.   Venous:  The inferior vena cava is normal in size with greater than 50%  respiratory variability, suggesting right atrial pressure of 3 mmHg.   IAS/Shunts: No atrial level shunt detected by color flow Doppler.      Assessment & Plan   1.  ***   Josefa HERO. Vishaal Strollo NP-C     03/20/2024, 5:49 PM [facility] [address] [place], [place] [address] Office [phone]    Notice: This dictation was prepared with Dragon dictation along with smaller phrase technology. Any transcriptional errors that result from this process are unintentional and may not be corrected upon review.   I spent***minutes examining this patient, reviewing medications, and using patient centered shared decision making involving their cardiac care.   I spent  20 minutes reviewing past medical history,  medications, and prior cardiac tests.

## 2024-03-22 ENCOUNTER — Ambulatory Visit: Attending: General Practice | Admitting: General Practice

## 2024-04-19 ENCOUNTER — Other Ambulatory Visit: Payer: Self-pay | Admitting: *Deleted

## 2024-04-19 ENCOUNTER — Ambulatory Visit: Attending: Cardiology | Admitting: *Deleted

## 2024-04-19 DIAGNOSIS — I240 Acute coronary thrombosis not resulting in myocardial infarction: Secondary | ICD-10-CM

## 2024-04-19 DIAGNOSIS — Z5181 Encounter for therapeutic drug level monitoring: Secondary | ICD-10-CM | POA: Insufficient documentation

## 2024-04-19 LAB — POCT INR: INR: 2.5 (ref 2.0–3.0)

## 2024-04-19 MED ORDER — WARFARIN SODIUM 5 MG PO TABS: ORAL_TABLET | ORAL | 1 refills | Status: DC

## 2024-04-19 NOTE — Telephone Encounter (Signed)
 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

## 2024-04-19 NOTE — Patient Instructions (Signed)
 Description   INR-2.5; Continue taking 2 tablets daily except 3 tablets each Monday, Wednesday, and Friday.  Recheck in 6 weeks.  Call with any questions # [phone].  BILL [id number]

## 2024-04-19 NOTE — Progress Notes (Signed)
 Description   INR-2.5; Continue taking 2 tablets daily except 3 tablets each Monday, Wednesday, and Friday.  Recheck in 6 weeks.  Call with any questions # [phone].  BILL [id number]

## 2024-05-31 ENCOUNTER — Ambulatory Visit: Attending: Cardiology

## 2024-05-31 DIAGNOSIS — Z5181 Encounter for therapeutic drug level monitoring: Secondary | ICD-10-CM | POA: Insufficient documentation

## 2024-05-31 DIAGNOSIS — I240 Acute coronary thrombosis not resulting in myocardial infarction: Secondary | ICD-10-CM | POA: Insufficient documentation

## 2024-05-31 LAB — POCT INR: INR: 2 (ref 2.0–3.0)

## 2024-05-31 MED ORDER — WARFARIN SODIUM 5 MG PO TABS: ORAL_TABLET | ORAL | 0 refills | Status: AC

## 2024-05-31 NOTE — Patient Instructions (Signed)
 Description   INR-2.0; Continue taking 2 tablets daily except 3 tablets each Monday, Wednesday, and Friday.  Recheck in 6 weeks.  Call with any questions # [phone].  BILL [id number]

## 2024-05-31 NOTE — Progress Notes (Signed)
 Description   INR-2.0; Continue taking 2 tablets daily except 3 tablets each Monday, Wednesday, and Friday.  Recheck in 6 weeks.  Call with any questions # [phone].  BILL [id number]

## 2024-07-12 ENCOUNTER — Ambulatory Visit
